# Patient Record
Sex: Female | Born: 1950
Health system: Southern US, Community
[De-identification: ages and names within clinical notes are randomized; demographics above are authoritative.]

## PROBLEM LIST (undated history)

## (undated) DIAGNOSIS — F329 Major depressive disorder, single episode, unspecified: Secondary | ICD-10-CM

## (undated) DIAGNOSIS — F419 Anxiety disorder, unspecified: Secondary | ICD-10-CM

## (undated) DIAGNOSIS — G43909 Migraine, unspecified, not intractable, without status migrainosus: Secondary | ICD-10-CM

## (undated) DIAGNOSIS — I7090 Unspecified atherosclerosis: Secondary | ICD-10-CM

## (undated) DIAGNOSIS — E079 Disorder of thyroid, unspecified: Secondary | ICD-10-CM

## (undated) DIAGNOSIS — M47812 Spondylosis without myelopathy or radiculopathy, cervical region: Secondary | ICD-10-CM

## (undated) DIAGNOSIS — S62609A Fracture of unspecified phalanx of unspecified finger, initial encounter for closed fracture: Secondary | ICD-10-CM

## (undated) DIAGNOSIS — I82409 Acute embolism and thrombosis of unspecified deep veins of unspecified lower extremity: Secondary | ICD-10-CM

## (undated) DIAGNOSIS — M1990 Unspecified osteoarthritis, unspecified site: Secondary | ICD-10-CM

## (undated) DIAGNOSIS — R569 Unspecified convulsions: Secondary | ICD-10-CM

## (undated) DIAGNOSIS — G40909 Epilepsy, unspecified, not intractable, without status epilepticus: Secondary | ICD-10-CM

## (undated) DIAGNOSIS — Z972 Presence of dental prosthetic device (complete) (partial): Secondary | ICD-10-CM

## (undated) HISTORY — DX: Migraine, unspecified, not intractable, without status migrainosus: G43.909

## (undated) HISTORY — PX: TONSILLECTOMY: SUR1361

## (undated) HISTORY — DX: Spondylosis without myelopathy or radiculopathy, cervical region: M47.812

## (undated) HISTORY — DX: Unspecified atherosclerosis: I70.90

## (undated) HISTORY — DX: Epilepsy, unspecified, not intractable, without status epilepticus: G40.909

## (undated) HISTORY — PX: BREAST EXCISIONAL BIOPSY: SUR124

## (undated) HISTORY — PX: TUBAL LIGATION: SHX77

## (undated) HISTORY — PX: NASAL SINUS SURGERY: SHX719

## (undated) HISTORY — DX: Unspecified convulsions: R56.9

---
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
## 1988-04-13 HISTORY — PX: BREAST LUMPECTOMY: SHX2

## 2006-04-13 DIAGNOSIS — I82409 Acute embolism and thrombosis of unspecified deep veins of unspecified lower extremity: Secondary | ICD-10-CM

## 2006-04-13 HISTORY — DX: Acute embolism and thrombosis of unspecified deep veins of unspecified lower extremity: I82.409

## 2011-12-21 ENCOUNTER — Other Ambulatory Visit: Payer: Self-pay | Admitting: Internal Medicine

## 2011-12-21 DIAGNOSIS — Z1231 Encounter for screening mammogram for malignant neoplasm of breast: Secondary | ICD-10-CM

## 2011-12-22 ENCOUNTER — Ambulatory Visit
Admission: RE | Admit: 2011-12-22 | Discharge: 2011-12-22 | Disposition: A | Discharge status: Home / Self Care | Payer: Medicaid Other | Source: Ambulatory Visit | Attending: Internal Medicine | Admitting: Internal Medicine

## 2011-12-22 DIAGNOSIS — Z1231 Encounter for screening mammogram for malignant neoplasm of breast: Secondary | ICD-10-CM

## 2012-06-27 ENCOUNTER — Other Ambulatory Visit: Payer: Self-pay | Admitting: Internal Medicine

## 2012-07-12 ENCOUNTER — Ambulatory Visit
Admission: RE | Admit: 2012-07-12 | Discharge: 2012-07-12 | Disposition: A | Discharge status: Home / Self Care | Payer: Medicaid Other | Source: Ambulatory Visit | Attending: Internal Medicine | Admitting: Internal Medicine

## 2012-07-12 DIAGNOSIS — Z780 Asymptomatic menopausal state: Secondary | ICD-10-CM

## 2012-08-15 ENCOUNTER — Telehealth: Payer: Self-pay | Admitting: Diagnostic Neuroimaging

## 2012-08-15 NOTE — Telephone Encounter (Signed)
I called and spoke with the patient concerning her message. I informed the patient that Dr. Marjory Lies wants her to have a EEG monitoring at [facility] and not a sleep study. I informed the patient that Dr. Richrd Humbles assistant will take care of the referral.

## 2012-08-26 ENCOUNTER — Telehealth: Payer: Self-pay

## 2012-08-26 DIAGNOSIS — R569 Unspecified convulsions: Secondary | ICD-10-CM

## 2012-08-26 NOTE — Telephone Encounter (Signed)
Spoke to pt, says she has not been contacted for EEG scheduling @ [facility]. [facility] would resend order. Pt agreed.

## 2012-09-15 ENCOUNTER — Encounter: Payer: Self-pay | Admitting: Obstetrics & Gynecology

## 2012-10-20 ENCOUNTER — Telehealth: Payer: Self-pay | Admitting: Diagnostic Neuroimaging

## 2012-10-24 NOTE — Telephone Encounter (Signed)
Called patient lvm asking her to return my phone call if she has any more problems with medication.

## 2012-10-24 NOTE — Telephone Encounter (Signed)
Called patients primary and they relayed to me the patient vimpat was sent on 10/21/2012. I called the patient and asked her to call her pharmacy to pick upher refill and to call her primary if she has anymore questions regarding this matter. This was left on her voice mail patient gave the ok to do so.

## 2012-12-23 ENCOUNTER — Telehealth: Payer: Self-pay

## 2012-12-23 NOTE — Telephone Encounter (Signed)
Patient called, left message saying she would like Dr Marjory Lies to start filling Requip 1mg  BID.  We have not prescribed this in the past.  Okay to fill?  Please advise.  Thank you.

## 2012-12-26 ENCOUNTER — Other Ambulatory Visit: Payer: Self-pay | Admitting: Diagnostic Neuroimaging

## 2012-12-28 ENCOUNTER — Ambulatory Visit (INDEPENDENT_AMBULATORY_CARE_PROVIDER_SITE_OTHER): Payer: Medicaid Other | Admitting: Diagnostic Neuroimaging

## 2012-12-28 ENCOUNTER — Encounter: Payer: Self-pay | Admitting: Diagnostic Neuroimaging

## 2012-12-28 VITALS — BP 135/81 | HR 59 | Temp 98.3°F | Ht 62.5 in | Wt 182.3 lb

## 2012-12-28 DIAGNOSIS — G2581 Restless legs syndrome: Secondary | ICD-10-CM

## 2012-12-28 DIAGNOSIS — R569 Unspecified convulsions: Secondary | ICD-10-CM

## 2012-12-28 MED ORDER — ROPINIROLE HCL 1 MG PO TABS: 1.0000 mg | ORAL_TABLET | Freq: Two times a day (BID) | ORAL | Status: DC

## 2012-12-28 NOTE — Patient Instructions (Signed)
Continue current medications.  I will refer you to video EEG monitoring.

## 2012-12-28 NOTE — Progress Notes (Signed)
[facility]  PATIENT: Teresa Nguyen DOB: 1950/11/21  REFERRING CLINICIAN:  HISTORY FROM: PATIENT REASON FOR VISIT: FOLLOW  UP   HISTORICAL  CHIEF COMPLAINT:  Chief Complaint  Patient presents with  . Follow-up    F/U #6    HISTORY OF PRESENT ILLNESS:   UPDATE 12/28/12: Outside neurologist records received and reviewed. 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 Since last visit, no more seizures or spells. Her RLS is getting worse, b/c she is running out of ropinirole and was reducing the dose. Still with high stress. Not interested in seeing psychiatry.  UPDATE 06/10/12: Since last visit, sz stopped, until Dec 2013. She was preparing for colonoscopy, and anticipated having diarrhea from bowel prep. Before she took the bowel prep meds, she started with diarrhea. Soon after she had a seizure (no LOC, eye up, head turned, mouth twitching; no generalized convulsions. she could hear and move, but not talk). She took a valium, and her spell stopped.   I still haven't received records from prior neurologist. Patient tells me that she was dx'd with "partial non-epileptic seizures". She was told to take valium prn to stop her seizures at home, in order to avoid freq ER trips.  PRIOR HPI (12/04/11): 62 year old right-handed female here for evaluation of seizure disorder.  At age 62 years old, patient fell down a flight of stairs. Soon after she began to have "petit mall seizures". Patient states she was having inability to speak, convulsions and tongue biting. She started on Dilantin and phenobarbital early in life. Over the years she tried "many different seizure medications". She was living in [place] for most of her life and followed by neurology there. She had extensive testing with EEG, video EEG, sleep study, MRI, without any significant abnormalities. Unclear if any of these seizures were captured during EEG recordings. It sounds like  no epileptiform discharges were ever noted. Unfortunately I do not have any of her prior records to review myself for this visit.  Patient has been on Vimpat and topiramate over the past year, and now has one episode every 3 weeks of "seizure". Current episodes consist of gagging sensation, inability to speak, without loss of consciousness tongue biting or convulsions.  REVIEW OF SYSTEMS: Full 14 system review of systems performed and notable only for ringing in ears trouble swallowing joint pain feeling cold increased thirst memory loss speech difficulty difficulty swallowing seizures restless legs.  ALLERGIES: Allergies  Allergen Reactions  . Sulfa Antibiotics     HOME MEDICATIONS: Prior to Admission medications   Medication Sig Start Date End Date Taking? Authorizing Provider  lacosamide (VIMPAT) 200 MG TABS tablet Take by mouth 2 (two) times daily.   Yes Historical Provider, MD  rOPINIRole (REQUIP) 1 MG tablet Take 1 tablet (1 mg total) by mouth 2 (two) times daily. 12/28/12  Yes Suanne Marker, MD  topiramate (TOPAMAX) 100 MG tablet Take 100 mg by mouth 2 (two) times daily.   Yes Historical Provider, MD   Outpatient Prescriptions Prior to Visit  Medication Sig Dispense Refill  . lacosamide (VIMPAT) 200 MG TABS tablet Take by mouth 2 (two) times daily.      Marland Kitchen topiramate (TOPAMAX) 100 MG tablet Take 100 mg by mouth 2 (two) times daily.      Marland Kitchen rOPINIRole (REQUIP) 1 MG tablet Take 1 mg by mouth 2 (two) times daily.       Marland Kitchen levothyroxine (SYNTHROID, LEVOTHROID) 25 MCG tablet Take  25 mcg by mouth daily before breakfast.       No facility-administered medications prior to visit.    PAST MEDICAL HISTORY: Past Medical History  Diagnosis Date  . 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 Seizure disorder     since age 35-3    PAST SURGICAL HISTORY: Past Surgical History  Procedure Laterality Date  . Breast lumpectomy  1990    FAMILY HISTORY: Family History  Problem Relation Age of  Onset  . Stroke Father   . Heart attack Father   . Throat cancer Sister   . Seizures Sister   . Migraines Sister   . Diabetes Maternal Grandmother   . Diabetes Maternal Grandfather     SOCIAL HISTORY:  History   Social History  . Marital Status: Divorced    Spouse Name: N/A    Number of Children: 2  . Years of Education: 12th   Occupational History  . Not on file.   Social History Main Topics  . Smoking status: Former Smoker    Quit date: 04/16/2000  . Smokeless tobacco: Not on file  . Alcohol Use: No  . Drug Use: No  . Sexual Activity: Not on file   Other Topics Concern  . Not on file   Social History Narrative   Patient lives at home alone.   Caffeine Use: 1 cup of coffee daily     PHYSICAL EXAM  Filed Vitals:   12/28/12 1404  BP: 135/81  Pulse: 59  Temp: 98.3 F (36.8 C)  TempSrc: Oral  Height: 5' 2.5" (1.588 m)  Weight: 182 lb 4.8 oz (82.691 kg)    Not recorded    Body mass index is 32.79 kg/(m^2).  GENERAL EXAM: Patient is in no distress  CARDIOVASCULAR: Regular rate and rhythm, no murmurs, no carotid bruits  NEUROLOGIC: MENTAL STATUS: awake, alert, language fluent, comprehension intact, naming intact CRANIAL NERVE: no papilledema on fundoscopic exam, pupils equal and reactive to light, visual fields full to confrontation, extraocular muscles intact, no nystagmus, facial sensation and strength symmetric, uvula midline, shoulder shrug symmetric, tongue midline; MILD DYSARTHRIA. MOTOR: normal bulk and tone, full strength in the BUE, BLE SENSORY: normal and symmetric to light touch, pinprick, temperature, vibration COORDINATION: finger-nose-finger, fine finger movements normal REFLEXES: deep tendon reflexes present and symmetric GAIT/STATION: narrow based gait; able to walk on toes, heels and tandem; romberg is negative   DIAGNOSTIC DATA (LABS, IMAGING, TESTING) - I reviewed patient records, labs, notes, testing and imaging myself where  available.  No results found for this basename: WBC, HGB, HCT, MCV, PLT   No results found for this basename: na, k, cl, co2, glucose, bun, creatinine, calcium, prot, albumin, ast, alt, alkphos, bilitot, gfrnonaa, gfraa   No results found for this basename: CHOL, HDL, LDLCALC, LDLDIRECT, TRIG, CHOLHDL   No results found for this basename: HGBA1C   No results found for this basename: VITAMINB12   No results found for this basename: TSH    ASSESSMENT AND PLAN  62 y.o. female with history of seizure disorder since age 33-47 years old, now with possible non-epileptic spells, but stable since Dec 2013.   PLAN: 1. Continue Vimpat for seizure control 2. Continue topiramate for migraine control 3. Continue ropinirole for RLS 4. refer for video EEG monitoring to confirm dx (seizure vs non-epileptic spells)   Orders Placed This Encounter  Procedures  . Ambulatory referral to Neurology    Meds ordered this encounter  Medications  .  rOPINIRole (REQUIP) 1 MG tablet    Sig: Take 1 tablet (1 mg total) by mouth 2 (two) times daily.    Dispense:  60 tablet    Refill:  12    Return in about 1 year (around 12/28/2013) for with Heide Guile or Penumalli.    Suanne Marker, MD 12/28/2012, 2:45 PM Certified in Neurology, Neurophysiology and Neuroimaging  [facility] [address] [place], [place] [address] [phone]

## 2013-01-04 ENCOUNTER — Telehealth: Payer: Self-pay | Admitting: Diagnostic Neuroimaging

## 2013-01-06 NOTE — Telephone Encounter (Signed)
Patient requesting referral for video EEG be sent to [facility] rather than [facility] because [facility] would be more convenient for her. Advised will fwd to referral coordinator. Gave to  [employer]

## 2013-01-13 ENCOUNTER — Other Ambulatory Visit: Payer: Self-pay

## 2013-01-13 DIAGNOSIS — Z1231 Encounter for screening mammogram for malignant neoplasm of breast: Secondary | ICD-10-CM

## 2013-01-20 ENCOUNTER — Other Ambulatory Visit: Payer: Self-pay

## 2013-01-20 DIAGNOSIS — Z1231 Encounter for screening mammogram for malignant neoplasm of breast: Secondary | ICD-10-CM

## 2013-01-31 ENCOUNTER — Ambulatory Visit: Payer: Self-pay

## 2013-02-01 ENCOUNTER — Ambulatory Visit
Admission: RE | Admit: 2013-02-01 | Discharge: 2013-02-01 | Disposition: A | Discharge status: Home / Self Care | Payer: Medicaid Other | Source: Ambulatory Visit

## 2013-02-01 DIAGNOSIS — Z1231 Encounter for screening mammogram for malignant neoplasm of breast: Secondary | ICD-10-CM

## 2013-02-16 ENCOUNTER — Telehealth: Payer: Self-pay | Admitting: Diagnostic Neuroimaging

## 2013-02-21 ENCOUNTER — Encounter: Payer: Self-pay | Admitting: Advanced Practice Midwife

## 2013-02-21 ENCOUNTER — Ambulatory Visit (INDEPENDENT_AMBULATORY_CARE_PROVIDER_SITE_OTHER): Payer: Medicaid Other | Admitting: Advanced Practice Midwife

## 2013-02-21 VITALS — BP 166/92 | HR 63 | Temp 99.0°F | Ht 63.0 in | Wt 180.0 lb

## 2013-02-21 DIAGNOSIS — I10 Essential (primary) hypertension: Secondary | ICD-10-CM | POA: Insufficient documentation

## 2013-02-21 DIAGNOSIS — Z0000 Encounter for general adult medical examination without abnormal findings: Secondary | ICD-10-CM

## 2013-02-21 DIAGNOSIS — G43909 Migraine, unspecified, not intractable, without status migrainosus: Secondary | ICD-10-CM | POA: Insufficient documentation

## 2013-02-21 DIAGNOSIS — F439 Reaction to severe stress, unspecified: Secondary | ICD-10-CM

## 2013-02-21 DIAGNOSIS — R1031 Right lower quadrant pain: Secondary | ICD-10-CM | POA: Insufficient documentation

## 2013-02-21 NOTE — Progress Notes (Signed)
Patient ID: Teresa Nguyen, female   DOB: 04-26-1950, 62 y.o.   MRN: [id number]  Chief Complaint  Patient presents with  . Gynecologic Exam    HPI Teresa Nguyen is a 62 y.o. female.  Patient here for GYN exam. Reports intermittent monthly RLQ pain. Denies other GYN concerns.  Reports new onset of migraines, she is being managed by a Neurologist, she has a sleep study pending. Her migraines due cause partial seizures. She reports having specific symptoms prior to a seizure, reports having the seizure disorder since 62yo.     Gynecologic Exam The patient's primary symptoms include pelvic pain. Associated symptoms include constipation and headaches. Pertinent negatives include no diarrhea, dysuria or fever. There is no history of an abdominal surgery or a gynecological surgery.  Pelvic Pain The patient's primary symptoms include pelvic pain. This is a chronic problem. The current episode started more than 1 year ago. The problem occurs rarely. The problem has been unchanged. The pain is mild. The problem affects the right side. She is not pregnant. Associated symptoms include constipation and headaches. Pertinent negatives include no diarrhea, dysuria or fever. Nothing aggravates the symptoms. She has tried nothing for the symptoms. She is not sexually active. She uses tubal ligation for contraception. She is postmenopausal. There is no history of an abdominal surgery or a gynecological surgery.    Past Medical History  Diagnosis Date  . 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 Seizure disorder     since age 49-3  . Migraine     Past Surgical History  Procedure Laterality Date  . Breast lumpectomy  1990  . Tubal ligation      Family History  Problem Relation Age of Onset  . Stroke Father   . Heart attack Father   . Throat cancer Sister   . Seizures Sister   . Migraines Sister   . Diabetes Maternal Grandmother   . Diabetes Maternal Grandfather     Social History History  Substance  Use Topics  . Smoking status: Former Smoker    Quit date: 04/16/1998  . Smokeless tobacco: Not on file  . Alcohol Use: No    Allergies  Allergen Reactions  . Sulfa Antibiotics     Current Outpatient Prescriptions  Medication Sig Dispense Refill  . aspirin-acetaminophen-caffeine (EXCEDRIN MIGRAINE) 250-250-65 MG per tablet Take 1 tablet by mouth every 6 (six) hours as needed for headache.      . diazepam (VALIUM) 10 MG tablet Take 10 mg by mouth every 6 (six) hours as needed for anxiety.      Marland Kitchen lacosamide (VIMPAT) 200 MG TABS tablet Take by mouth 2 (two) times daily.      Marland Kitchen rOPINIRole (REQUIP) 1 MG tablet Take 1 tablet (1 mg total) by mouth 2 (two) times daily.  60 tablet  12  . topiramate (TOPAMAX) 100 MG tablet Take 100 mg by mouth 2 (two) times daily.      . vitamin E 200 UNIT capsule Take 200 Units by mouth daily.       No current facility-administered medications for this visit.    Review of Systems Review of Systems  Constitutional: Positive for fatigue. Negative for fever, activity change and appetite change.  Eyes: Positive for visual disturbance.  Respiratory: Negative.   Cardiovascular: Negative.   Gastrointestinal: Positive for constipation. Negative for diarrhea.  Endocrine: Negative.   Genitourinary: Positive for pelvic pain. Negative for dysuria.  Musculoskeletal: Negative.   Skin: Negative.  Allergic/Immunologic: Negative.   Neurological: Positive for seizures and headaches.       New onset of migraines  Hematological: Negative.     Blood pressure 166/92, pulse 63, temperature 99 F (37.2 C), height 5\' 3"  (1.6 m), weight 180 lb (81.647 kg).  Physical Exam Physical Exam  Constitutional: She is oriented to person, place, and time. She appears well-developed and well-nourished.  HENT:  Head: Normocephalic.  Eyes: Pupils are equal, round, and reactive to light.  Neck: Normal range of motion.  Cardiovascular: Normal rate, regular rhythm and normal heart  sounds.   Pulmonary/Chest: Effort normal and breath sounds normal.  Abdominal: Soft. Bowel sounds are normal.  Genitourinary: Vagina normal.  Right lower pelvic pain w/ exam, fullness, no mass palpable.   Musculoskeletal: Normal range of motion.  Neurological: She is alert and oriented to person, place, and time.  Skin: Skin is warm and dry.  Psychiatric: She has a normal mood and affect. Her behavior is normal. Judgment and thought content normal.  Rectal Exam: no polyps or masses palpated, no evidence of bleeding  Data Reviewed NA  Assessment    Patient Active Problem List   Diagnosis Date Noted  . Migraine 02/21/2013  . Right lower quadrant pain 02/21/2013  . Hypertension 02/21/2013  . Stress at home 02/21/2013  . Restless leg syndrome 12/28/2012  . Convulsion, non-epileptic 12/28/2012       Plan    Recommended patient not drive at this time due to seizure condition. [place] for right lower quad pain ordered today. Cont to monitor symptoms.   Pap smear today RTC PRN       Charmayne Odell 02/21/2013, 9:48 AM

## 2013-02-22 NOTE — Addendum Note (Signed)
Addended by: Henriette Combs on: 02/22/2013 10:30 AM   Modules accepted: Orders

## 2013-02-23 LAB — PAP IG W/ RFLX HPV ASCU

## 2013-02-24 ENCOUNTER — Other Ambulatory Visit: Payer: Self-pay

## 2013-02-24 MED ORDER — TOPIRAMATE 100 MG PO TABS: 100.0000 mg | ORAL_TABLET | Freq: Two times a day (BID) | ORAL | Status: DC

## 2013-02-24 NOTE — Telephone Encounter (Signed)
I called and let patient know topiramate Rx has been sent.

## 2013-03-28 ENCOUNTER — Other Ambulatory Visit: Payer: Self-pay | Admitting: Diagnostic Neuroimaging

## 2013-03-28 MED ORDER — LACOSAMIDE 200 MG PO TABS: 200.0000 mg | ORAL_TABLET | Freq: Two times a day (BID) | ORAL | Status: DC

## 2013-03-28 NOTE — Telephone Encounter (Signed)
Patient called stating that she had a death in the family and will not be able to do her sleep lab appointment at [facility] until 05/09/13. Patient states she only has enough Vimpat medication to last her until 04/13/13. Patient is requesting a refill until January appointment. Patient wishes to be called if this is to be done, please call the patient.

## 2013-03-30 NOTE — Telephone Encounter (Signed)
Rx faxed to [facility] at [phone]. 

## 2013-05-05 ENCOUNTER — Telehealth: Payer: Self-pay | Admitting: Diagnostic Neuroimaging

## 2013-05-05 NOTE — Telephone Encounter (Signed)
Pt called and stated that she has an appointment on Tuesday, 05/09/13 at [facility].  She wanted to know if we had already sent over the notes as we referred her to that office.  Please contact.

## 2013-05-09 DIAGNOSIS — G40209 Localization-related (focal) (partial) symptomatic epilepsy and epileptic syndromes with complex partial seizures, not intractable, without status epilepticus: Secondary | ICD-10-CM | POA: Insufficient documentation

## 2013-05-11 ENCOUNTER — Other Ambulatory Visit (HOSPITAL_COMMUNITY): Payer: Self-pay | Admitting: Psychiatry

## 2013-05-11 DIAGNOSIS — R569 Unspecified convulsions: Secondary | ICD-10-CM

## 2013-05-24 ENCOUNTER — Ambulatory Visit (HOSPITAL_COMMUNITY)
Admission: RE | Admit: 2013-05-24 | Discharge: 2013-05-24 | Disposition: A | Discharge status: Home / Self Care | Payer: Medicaid Other | Source: Ambulatory Visit | Attending: Psychiatry | Admitting: Psychiatry

## 2013-05-24 DIAGNOSIS — I10 Essential (primary) hypertension: Secondary | ICD-10-CM | POA: Insufficient documentation

## 2013-05-24 DIAGNOSIS — G40909 Epilepsy, unspecified, not intractable, without status epilepticus: Secondary | ICD-10-CM | POA: Insufficient documentation

## 2013-05-24 DIAGNOSIS — R569 Unspecified convulsions: Secondary | ICD-10-CM

## 2013-05-24 LAB — CREATININE, SERUM
CREATININE: 0.76 mg/dL (ref 0.50–1.10)
GFR calc Af Amer: >90 mL/min (ref >90)
GFR calc non Af Amer: 90 mL/min — ABNORMAL LOW (ref >90)

## 2013-05-24 MED ORDER — GADOBENATE DIMEGLUMINE 529 MG/ML IV SOLN
18.0000 mL | Freq: Once | INTRAVENOUS | Status: AC
  Administered 2013-05-24: 18 mL via INTRAVENOUS

## 2013-12-29 ENCOUNTER — Encounter (INDEPENDENT_AMBULATORY_CARE_PROVIDER_SITE_OTHER): Payer: Self-pay

## 2013-12-29 ENCOUNTER — Encounter: Payer: Self-pay | Admitting: Nurse Practitioner

## 2013-12-29 ENCOUNTER — Ambulatory Visit (INDEPENDENT_AMBULATORY_CARE_PROVIDER_SITE_OTHER): Payer: Medicaid Other | Admitting: Nurse Practitioner

## 2013-12-29 VITALS — BP 128/74 | HR 59 | Ht 63.0 in | Wt 178.2 lb

## 2013-12-29 DIAGNOSIS — G40909 Epilepsy, unspecified, not intractable, without status epilepticus: Secondary | ICD-10-CM

## 2013-12-29 DIAGNOSIS — G40209 Localization-related (focal) (partial) symptomatic epilepsy and epileptic syndromes with complex partial seizures, not intractable, without status epilepticus: Secondary | ICD-10-CM | POA: Insufficient documentation

## 2013-12-29 DIAGNOSIS — G43001 Migraine without aura, not intractable, with status migrainosus: Secondary | ICD-10-CM

## 2013-12-29 DIAGNOSIS — G40802 Other epilepsy, not intractable, without status epilepticus: Secondary | ICD-10-CM

## 2013-12-29 DIAGNOSIS — G2581 Restless legs syndrome: Secondary | ICD-10-CM

## 2013-12-29 DIAGNOSIS — G40219 Localization-related (focal) (partial) symptomatic epilepsy and epileptic syndromes with complex partial seizures, intractable, without status epilepticus: Secondary | ICD-10-CM

## 2013-12-29 MED ORDER — LACOSAMIDE 200 MG PO TABS: 200.0000 mg | ORAL_TABLET | Freq: Two times a day (BID) | ORAL | Status: DC

## 2013-12-29 MED ORDER — DIAZEPAM 10 MG PO TABS: 10.0000 mg | ORAL_TABLET | Freq: Four times a day (QID) | ORAL | Status: DC | PRN

## 2013-12-29 MED ORDER — TOPIRAMATE 100 MG PO TABS: 100.0000 mg | ORAL_TABLET | Freq: Two times a day (BID) | ORAL | Status: DC

## 2013-12-29 MED ORDER — ROPINIROLE HCL 1 MG PO TABS: 1.0000 mg | ORAL_TABLET | Freq: Two times a day (BID) | ORAL | Status: DC

## 2013-12-29 NOTE — Progress Notes (Signed)
PATIENT: Teresa Nguyen DOB: Nov 15, 1950  REASON FOR VISIT: routine follow up for frontal lobe epilepsy, RLS HISTORY FROM: patient  HISTORY OF PRESENT ILLNESS: UPDATE 12/29/13 (LL): Since last visit, went to [facility] to have EMU monitoring. EMU June 15-18, 2015: Long-term video EEG monitoring captured multiple events; including electrographic frontal hypermotor seizures. She also exhibited episodes of oral dyskinetic activity which is not seizure (no EEG correlate). It was concluded that her typical arousal events are consistent with frontal lobe epilepsy. Home medications Vimpat 200 mg twice a day,Topirimate 100 mg twice a day were resumed, but they recommended reducing home dose of Valium as she was taking 10-20 mg when necessary which likely to affects her affect and impairs her communication. She has had several close family members who have died in the last year, including her mother and her sister who died suddenly. Patient is very content on her current medication regimen, stating that she has felt better on Vimpat than ever before. She has not had many nocturnal seizures since starting Vimpat, and states that she was afraid to go to sleep prior to starting it. Her past AEDs have been phenobarbital, phenytoin, Lacosamide, lamotrigine, levetiracetam, Topirimate, valproic acid and zonisamide.Known seizure triggers are eating broccoli or eggplant. RLS symptoms are controlled on Ropinirole, although some nights she must take an extra 0.25 of a tablet to be comfortable.  UPDATE 12/28/12: Outside neurologist records received and reviewed. 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 Since last visit, no more seizures or spells. Her RLS is getting worse, b/c she is running out of ropinirole and was reducing the dose. Still with high stress. Not interested in seeing psychiatry.  UPDATE 06/10/12: Since last visit, sz stopped, until Dec 2013. She was preparing for colonoscopy,  and anticipated having diarrhea from bowel prep. Before she took the bowel prep meds, she started with diarrhea. Soon after she had a seizure (no LOC, eye up, head turned, mouth twitching; no generalized convulsions. she could hear and move, but not talk). She took a valium, and her spell stopped.  I still haven't received records from prior neurologist. Patient tells me that she was dx'd with "partial non-epileptic seizures". She was told to take valium prn to stop her seizures at home, in order to avoid freq ER trips.  PRIOR HPI (12/04/11): 63 year old right-handed female here for evaluation of seizure disorder.  At age 42 years old, patient fell down a flight of stairs. Soon after she began to have "petit mall seizures". Patient states she was having inability to speak, convulsions and tongue biting. She started on Dilantin and phenobarbital early in life. Over the years she tried "many different seizure medications". She was living in [place] for most of her life and followed by neurology there. She had extensive testing with EEG, video EEG, sleep study, MRI, without any significant abnormalities. Unclear if any of these seizures were captured during EEG recordings. It sounds like no epileptiform discharges were ever noted. Unfortunately I do not have any of her prior records to review myself for this visit.  Patient has been on Vimpat and topiramate over the past year, and now has one episode every 3 weeks of "seizure". Current episodes consist of gagging sensation, inability to speak, without loss of consciousness tongue biting or convulsions.   REVIEW OF SYSTEMS: Full 14 system review of systems performed and notable only for ringing in ears runny nose, trouble swallowing, drooling, blurred vision, cough, shortness of breath,  and heat intolerance, nausea, restless leg, and joint pain, neck pain, bruise easily, and memory loss, speech difficulty, numbness, seizures.  ALLERGIES: Allergies  Allergen  Reactions  . Other Diarrhea    Nuts cause diarrhea   . Sulfa Antibiotics     HOME MEDICATIONS: Outpatient Prescriptions Prior to Visit  Medication Sig Dispense Refill  . aspirin-acetaminophen-caffeine (EXCEDRIN MIGRAINE) 250-250-65 MG per tablet Take 1 tablet by mouth every 6 (six) hours as needed for headache.      . vitamin E 200 UNIT capsule Take 200 Units by mouth daily.      . diazepam (VALIUM) 10 MG tablet Take 10 mg by mouth every 6 (six) hours as needed for anxiety.      Marland Kitchen lacosamide (VIMPAT) 200 MG TABS tablet Take 1 tablet (200 mg total) by mouth 2 (two) times daily.  60 tablet  5  . rOPINIRole (REQUIP) 1 MG tablet Take 1 tablet (1 mg total) by mouth 2 (two) times daily.  60 tablet  12  . topiramate (TOPAMAX) 100 MG tablet Take 1 tablet (100 mg total) by mouth 2 (two) times daily.  180 tablet  3   No facility-administered medications prior to visit.    PHYSICAL EXAM Filed Vitals:   12/29/13 1034  BP: 128/74  Pulse: 59  Height: 5\' 3"  (1.6 m)  Weight: 178 lb 3.2 oz (80.831 kg)   Body mass index is 31.57 kg/(m^2).  Generalized: Well developed, in no acute distress  Head: normocephalic and atraumatic. Oropharynx benign  Neck: Supple, no carotid bruits  Cardiac: Regular rate rhythm, no murmur  Musculoskeletal: No deformity   Neurological examination  Mentation: Alert oriented to time, place, history taking. Follows all commands speech and language fluent, MILD DYSARTHRIA.  Cranial nerve II-XII: Fundoscopic exam not done. Pupils were equal round reactive to light extraocular movements were full, visual field were full on confrontational test. Facial sensation and strength were normal. hearing was intact to finger rubbing bilaterally. Uvula tongue midline. head turning and shoulder shrug and were normal and symmetric.Tongue protrusion into cheek strength was normal. Motor: The motor testing reveals 5 over 5 strength of all 4 extremities. Good symmetric motor tone is noted  throughout.  Sensory: Sensory testing is intact to soft touch on all 4 extremities. No evidence of extinction is noted.  Coordination: Cerebellar testing reveals good finger-nose-finger and heel-to-shin bilaterally.  Gait and station: Gait is normal. Tandem gait is normal. Romberg is negative. Reflexes: Deep tendon reflexes are symmetric and normal bilaterally.   ASSESSMENT: 63 y.o. female with history of seizure disorder since age 30-4 years old, diagnosed at [facility] in June 2015 with frontal lobe epilepsy through VEEG. She is not interested in surgery intervention at this time. We discussed adding Onfi, she declined at the present time.  PLAN:  Continue Vimpat for seizure control, 200 mg bid Continue topiramate 100 mg bid Continue Valium 10 mg prn for seizure aura. Continue ropinirole for RLS 1 mg bid, may take 1/2 tablet extra at night if needed. Follow up in 1 year, sooner as needed.  Meds ordered this encounter  Medications  . rOPINIRole (REQUIP) 1 MG tablet    Sig: Take 1 tablet (1 mg total) by mouth 2 (two) times daily. May take extra 1/2 tablet in evening if needed.    Dispense:  90 tablet    Refill:  12    Order Specific Question:  Supervising Provider    Answer:  Andrey Spearman R [[id number]]  .  diazepam (VALIUM) 10 MG tablet    Sig: Take 1 tablet (10 mg total) by mouth every 6 (six) hours as needed for anxiety.    Dispense:  45 tablet    Refill:  5    Order Specific Question:  Supervising Provider    Answer:  Andrey Spearman R [[id number]]  . topiramate (TOPAMAX) 100 MG tablet    Sig: Take 1 tablet (100 mg total) by mouth 2 (two) times daily.    Dispense:  180 tablet    Refill:  3    Order Specific Question:  Supervising Provider    Answer:  Andrey Spearman R [[id number]]  . lacosamide (VIMPAT) 200 MG TABS tablet    Sig: Take 1 tablet (200 mg total) by mouth 2 (two) times daily.    Dispense:  60 tablet    Refill:  5    Patient meets PA criteria Pharmacy Fax  [phone]    Order Specific Question:  Supervising Provider    Answer:  Penni Bombard [[id number]]   Return in about 1 year (around 12/30/2014) for seizures.  Teresa Rummage Zoiee Wimmer, MSN, FNP-BC, A/GNP-C 01/01/2014, 7:10 PM [facility] [address], [place], [place] [address] [phone]  Note: This document was prepared with digital dictation and possible smart phrase technology. Any transcriptional errors that result from this process are unintentional.

## 2013-12-29 NOTE — Patient Instructions (Signed)
PLAN:  1. Continue Vimpat for seizure control, 200 mg bid 2. Continue topiramate 100 mg  3. Continue ropinirole for RLS 1 mg bid, may take 1/2 tablet extra at night if needed. 4. Continue Valium as needed for seizure aura. 5. Follow up in 1 year, sooner as needed.  If you are interested in trying another new medication called Onfi for seizure control, please call the office.  Managing Seizure Triggers: Tips for Lifestyle Modification Adapted from the [place], [facility], [place], [place] and the journal "Clinical Nursing Practice in Epilepsy", Spring 1994.  Developing plans to modify your lifestyle is an important part of seizure preparedness. It's a way that you, as a person with seizures or a parent of a child with seizures, can take charge and play an active role in your epilepsy care. The following tips are examples of what people can do to manage triggers. Some of these tips may require a change in behavior, others may be ways to adjust your environment or schedule so not everything happens at once. Before choosing tips to try, make sure you've assessed your situation and talked to your doctor and other health care professionals for their suggestions too. Please note that research on the effectiveness of many of these techniques is limited. Many of these tips are common sense suggestions or are from health care professionals and people with epilepsy as to what they have seen and tried.  Noises: People who think they are affected by noises should be sure to talk to their doctor about whether they have a form of 'reflex epilepsy' or if general noise or distraction may be a trigger in another way. People with true reflex epilepsy may respond to specific seizure medicines and should talk to their doctor. Try using earplugs or earphones, especially in noisy or crowded places. Try listening to relaxing music or sounds, or try distracting  yourself by singing or focusing on another activity.  Bright, flashing or fluorescent lights: Use polarized or tinted glasses. Use natural lighting when indoors. Focus on distant objects when riding in a car to avoid flickering lights or patterns. Avoid discos, strobe lights or flashing bulbs on holiday decorations. Use computer monitor with minimal contrast glare or use a screen filter. Consult with your doctor about other specific recommendations for computer use.  Sleep: Try to regulate sleeping habits so you have a consistent schedule and get enough sleep. Keep a log or diary of your sleep patterns, seizures and general well-being. Ask a partner or companion to record his or her observations too. Consider the following ideas to improve sleep.  . Discuss your medicine schedule with your doctor or nurse. Changing times or doses at night may help sleep. . Limit caffeine and try to avoid it after noon time or mid?afternoon at the latest. . Avoid alcohol and nicotine prior to sleep. . Limit working or studying late at night. Stop work at least one hour before bedtime to allow time to relax. . Exercise in the early evening if possible. . Take warm showers or have someone give you a back rub before bedtime to decrease muscle tension. . Try relaxation exercises before bedtime. . Limit naps and don't nap in the early evening. . If anxious or worried, talk to someone or write down your feelings before going to sleep. Put this away and deal with these worries or concerns in the morning! . If you can't fall asleep within 15 minutes get up and do something  else for 15 minutes. Then go back to bed and try again. Don't toss and turn in bed all night.  Exercise: Regular exercise is good for everyone. Pace your exercise to avoid getting too tired or hyperventilation. Avoid exercising in the middle of the day during hot weather. Ask your doctor about any specific exercises you may need to  avoid.  Hyperventilation: Try relaxation or slow breathing exercises when anxious or if you begin to hyperventilate. Pace your activity and avoid sports that may trigger hyperventilation.  Diet: Regulate meal times and patterns around sleep, activity, and medication schedules. Usually taking medicines after food or around meals makes it easier to remember them and may lessen any stomach distress from side effects of medicines. Have a well-balanced diet and eat at consistent times to avoid long periods without food. If your appetite is poor, try small frequent meals instead of skipping meals. Avoid foods and drinks that may aggravate seizures. Not everyone is sensitive to foods, but if you are, talk to your doctor about how to modify your diet. If you are following a diet specifically for your epilepsy, be sure to follow the advice of your doctor and nutritionist.  Alcohol/Drugs: Avoid recreational drugs and talk to your doctor about use of alcohol. Avoid alcohol completely if you're going through high-risk times or have recently had surgery. If you choose to drink alcohol, use 'moderation', drink slowly, and have only one or two glasses at a time. Consider carefully what you drink, avoiding 'hard liquor' or mixed drinks that may have high alcohol content. If alcohol and drugs are a problem for you, talk to your doctor and get professional help.  Hormonal changes: Both men and women may notice a cyclical pattern to their seizures. Record seizures on a calendar and track them in relation to any changes in hormones. Women who are having menstrual cycles should track their cycle days. Women who have stopped having their menses should track other symptoms or changes, while women who are pregnant should track their pregnancy too. The use of hormonal medicines, such as contraceptives or birth control pills as well as hormonal replacement therapy, may affect seizures in some women, so record the dates and  doses of these medicines.  NOTE: some seizure medicines may interfere with the effectiveness of hormonal contraceptives making unexpected or unplanned pregnancy more likely. Be sure to talk to your doctor about all contraceptive use.When seizures cluster around menses or hormone changes, women should try to modify their lifestyle so other triggers don't occur during this high-risk time. Some women may use 'as needed' medicines to help treat seizures associated with menses. Note the use of these on your calendars and seizure preparedness plan.  Illness, fever, trauma: Notify your doctor if you become ill, have a fever, injure yourself seriously, or need other medicines such as antibiotics, painkillers, or cold medicines. Some people may notice that certain medicines can trigger seizures or interfere with seizure medicines. Fevers, other illnesses and injuries may also make you more susceptible and you'll need to monitor your seizures carefully. Try to limit other triggers during these times and talk to your doctor about what medicines you can use.  Stress, anxiety, depression: Emotional stress is a common trigger for some people, and stress can be a cause and symptom of mood problems such as anxiety and depression. Track your stress level and mood in relation to your seizures on your diary. During stressful times, consider ways to modify your lifestyle and manage stress better. Marland Kitchen  Try counseling to help cope with seizures or other problems. . Consider support groups for epilepsy, or groups for stress management, therapy, and other support. . Write down feelings in a diary on a regular basis. It helps you get feelings out, rather than hold them in, and can help you see the issues more clearly. . Use 'time-out' periods. Just like kids may need a time-out when they are overwhelmed or acting out, so too do adults. Giving yourself a time-out allows you to take a step back from the stressor or  situation and think about how best to address it. . Learn relaxation exercises, deep breathing, yoga, or other strategies that help with stress and general well-being. . Tell your doctor and nurse how you feel. The effects of stress can be harmful to your seizures, and your life. When mood changes last longer than expected, you may need help from a mental health professional too. If you feel emotionally unsafe, call your doctor or go to an emergency room to be evaluated.

## 2014-01-01 DIAGNOSIS — G40802 Other epilepsy, not intractable, without status epilepticus: Secondary | ICD-10-CM | POA: Insufficient documentation

## 2014-01-04 NOTE — Progress Notes (Signed)
I reviewed note and agree with plan.   Barnet Benavides R. Chevy Virgo, MD  Certified in Neurology, Neurophysiology and Neuroimaging  [facility] [address], [address] [place], [place] [address] [phone]   

## 2014-06-03 ENCOUNTER — Emergency Department (HOSPITAL_COMMUNITY): Payer: Medicaid Other

## 2014-06-03 ENCOUNTER — Encounter (HOSPITAL_COMMUNITY): Payer: Self-pay | Admitting: *Deleted

## 2014-06-03 ENCOUNTER — Emergency Department (HOSPITAL_COMMUNITY)
Admission: EM | Admit: 2014-06-03 | Discharge: 2014-06-03 | Disposition: A | Discharge status: Home / Self Care | Payer: Medicaid Other | Attending: Emergency Medicine | Admitting: Emergency Medicine

## 2014-06-03 DIAGNOSIS — G43909 Migraine, unspecified, not intractable, without status migrainosus: Secondary | ICD-10-CM | POA: Diagnosis not present

## 2014-06-03 DIAGNOSIS — M1990 Unspecified osteoarthritis, unspecified site: Secondary | ICD-10-CM | POA: Diagnosis not present

## 2014-06-03 DIAGNOSIS — Z791 Long term (current) use of non-steroidal anti-inflammatories (NSAID): Secondary | ICD-10-CM | POA: Diagnosis not present

## 2014-06-03 DIAGNOSIS — E079 Disorder of thyroid, unspecified: Secondary | ICD-10-CM | POA: Insufficient documentation

## 2014-06-03 DIAGNOSIS — Z79899 Other long term (current) drug therapy: Secondary | ICD-10-CM | POA: Diagnosis not present

## 2014-06-03 DIAGNOSIS — R55 Syncope and collapse: Secondary | ICD-10-CM | POA: Insufficient documentation

## 2014-06-03 DIAGNOSIS — Z87891 Personal history of nicotine dependence: Secondary | ICD-10-CM | POA: Diagnosis not present

## 2014-06-03 HISTORY — DX: Unspecified osteoarthritis, unspecified site: M19.90

## 2014-06-03 HISTORY — DX: Disorder of thyroid, unspecified: E07.9

## 2014-06-03 LAB — BASIC METABOLIC PANEL
Anion gap: 3 — ABNORMAL LOW (ref 5–15)
BUN: 29 mg/dL — AB (ref 6–23)
CO2: 25 mmol/L (ref 19–32)
Calcium: 9.3 mg/dL (ref 8.4–10.5)
Chloride: 114 mmol/L — ABNORMAL HIGH (ref 96–112)
Creatinine, Ser: 0.99 mg/dL (ref 0.50–1.10)
GFR calc non Af Amer: 60 mL/min — ABNORMAL LOW (ref >90)
GFR, EST AFRICAN AMERICAN: 70 mL/min — AB (ref >90)
Glucose, Bld: 110 mg/dL — ABNORMAL HIGH (ref 70–99)
Potassium: 3.8 mmol/L (ref 3.5–5.1)
SODIUM: 142 mmol/L (ref 135–145)

## 2014-06-03 LAB — CBC
HCT: 41.5 % (ref 36.0–46.0)
HEMOGLOBIN: 13.9 g/dL (ref 12.0–15.0)
MCH: 30.3 pg (ref 26.0–34.0)
MCHC: 33.5 g/dL (ref 30.0–36.0)
MCV: 90.4 fL (ref 78.0–100.0)
PLATELETS: 175 10*3/uL (ref 150–400)
RBC: 4.59 MIL/uL (ref 3.87–5.11)
RDW: 12.4 % (ref 11.5–15.5)
WBC: 5.4 10*3/uL (ref 4.0–10.5)

## 2014-06-03 LAB — I-STAT TROPONIN, ED: Troponin i, poc: 0.01 ng/mL (ref 0.00–0.08)

## 2014-06-03 NOTE — ED Notes (Signed)
Pt reports getting up this am and fell in bathroom. Pt unsure if she passed out or possibly had a seizure, has hx of seizures. Pt reports began not feeling well last night, no appetite and didn't sleep well.

## 2014-06-03 NOTE — ED Notes (Signed)
Pt stated that she took diazepam PO approximately 1140am today.

## 2014-06-03 NOTE — ED Notes (Signed)
EDP at bedside reviewing pt results.

## 2014-06-03 NOTE — ED Notes (Signed)
Attempted IV access.  

## 2014-06-03 NOTE — Discharge Instructions (Signed)
Syncope °Syncope is a medical term for fainting or passing out. This means you lose consciousness and drop to the ground. People are generally unconscious for less than 5 minutes. You may have some muscle twitches for up to 15 seconds before waking up and returning to normal. Syncope occurs more often in older adults, but it can happen to anyone. While most causes of syncope are not dangerous, syncope can be a sign of a serious medical problem. It is important to seek medical care.  °CAUSES  °Syncope is caused by a sudden drop in blood flow to the brain. The specific cause is often not determined. Factors that can bring on syncope include: °· Taking medicines that lower blood pressure. °· Sudden changes in posture, such as standing up quickly. °· Taking more medicine than prescribed. °· Standing in one place for too long. °· Seizure disorders. °· Dehydration and excessive exposure to heat. °· Low blood sugar (hypoglycemia). °· Straining to have a bowel movement. °· Heart disease, irregular heartbeat, or other circulatory problems. °· Fear, emotional distress, seeing blood, or severe pain. °SYMPTOMS  °Right before fainting, you may: °· Feel dizzy or light-headed. °· Feel nauseous. °· See all white or all black in your field of vision. °· Have cold, clammy skin. °DIAGNOSIS  °Your health care provider will ask about your symptoms, perform a physical exam, and perform an electrocardiogram (ECG) to record the electrical activity of your heart. Your health care provider may also perform other heart or blood tests to determine the cause of your syncope which may include: °· Transthoracic echocardiogram (TTE). During echocardiography, sound waves are used to evaluate how blood flows through your heart. °· Transesophageal echocardiogram (TEE). °· Cardiac monitoring. This allows your health care provider to monitor your heart rate and rhythm in real time. °· Holter monitor. This is a portable device that records your  heartbeat and can help diagnose heart arrhythmias. It allows your health care provider to track your heart activity for several days, if needed. °· Stress tests by exercise or by giving medicine that makes the heart beat faster. °TREATMENT  °In most cases, no treatment is needed. Depending on the cause of your syncope, your health care provider may recommend changing or stopping some of your medicines. °HOME CARE INSTRUCTIONS °· Have someone stay with you until you feel stable. °· Do not drive, use machinery, or play sports until your health care provider says it is okay. °· Keep all follow-up appointments as directed by your health care provider. °· Lie down right away if you start feeling like you might faint. Breathe deeply and steadily. Wait until all the symptoms have passed. °· Drink enough fluids to keep your urine clear or pale yellow. °· If you are taking blood pressure or heart medicine, get up slowly and take several minutes to sit and then stand. This can reduce dizziness. °SEEK IMMEDIATE MEDICAL CARE IF:  °· You have a severe headache. °· You have unusual pain in the chest, abdomen, or back. °· You are bleeding from your mouth or rectum, or you have black or tarry stool. °· You have an irregular or very fast heartbeat. °· You have pain with breathing. °· You have repeated fainting or seizure-like jerking during an episode. °· You faint when sitting or lying down. °· You have confusion. °· You have trouble walking. °· You have severe weakness. °· You have vision problems. °If you fainted, call your local emergency services (911 in [place] Do not drive   yourself to the hospital.  °MAKE SURE YOU: °· Understand these instructions. °· Will watch your condition. °· Will get help right away if you are not doing well or get worse. °Document Released: 03/30/2005 Document Revised: 04/04/2013 Document Reviewed: 05/29/2011 °ExitCare® Patient Information ©2015 [employer]. This information is not intended to replace  advice given to you by your health care provider. Make sure you discuss any questions you have with your health care provider. ° °

## 2014-06-03 NOTE — ED Provider Notes (Signed)
CSN: [id number]     Arrival date & time 06/03/14  1401 History   First MD Initiated Contact with Patient 06/03/14 1454     Chief Complaint  Patient presents with  . Loss of Consciousness     (Consider location/radiation/quality/duration/timing/severity/associated sxs/prior Treatment) Patient is a 64 y.o. female presenting with syncope.  Loss of Consciousness Episode history:  Single Most recent episode:  Today Duration: very brief. Timing:  Constant Progression:  Unchanged Chronicity:  Recurrent Context: standing up   Context comment:  Ho seizures, atypical Relieved by:  Nothing Worsened by:  Nothing tried Ineffective treatments:  None tried Associated symptoms: no anxiety, no chest pain, no confusion, no difficulty breathing, no fever, no nausea, no shortness of breath and no vomiting     Past Medical History  Diagnosis Date  . 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 Seizure disorder     since age 44-3  . Migraine   . Thyroid disease   . Arthritis    Past Surgical History  Procedure Laterality Date  . Breast lumpectomy  1990  . Tubal ligation     Family History  Problem Relation Age of Onset  . Stroke Father   . Heart attack Father   . Throat cancer Sister   . Seizures Sister   . Migraines Sister   . Diabetes Maternal Grandmother   . Diabetes Maternal Grandfather    History  Substance Use Topics  . Smoking status: Former Smoker    Quit date: 04/16/1998  . Smokeless tobacco: Not on file  . Alcohol Use: No   OB History    No data available     Review of Systems  Constitutional: Negative for fever.  Respiratory: Negative for shortness of breath.   Cardiovascular: Positive for syncope. Negative for chest pain.  Gastrointestinal: Negative for nausea and vomiting.  Psychiatric/Behavioral: Negative for confusion.  All other systems reviewed and are negative.     Allergies  Other and Sulfa antibiotics  Home Medications   Prior to Admission medications    Medication Sig Start Date End Date Taking? Authorizing Provider  aspirin-acetaminophen-caffeine (EXCEDRIN MIGRAINE) [phone] MG per tablet Take 1 tablet by mouth every 6 (six) hours as needed for migraine.    Yes Historical Provider, MD  atorvastatin (LIPITOR) 10 MG tablet Take 10 mg by mouth at bedtime.  05/04/13  Yes Historical Provider, MD  diazepam (VALIUM) 10 MG tablet Take 1 tablet (10 mg total) by mouth every 6 (six) hours as needed for anxiety. Patient taking differently: Take 10 mg by mouth every 4 (four) hours as needed for anxiety (seizure prevention).  12/29/13  Yes Philmore Pali, NP  docusate sodium (COLACE) 100 MG capsule Take 100 mg by mouth at bedtime.   Yes Historical Provider, MD  fluticasone (FLONASE) 50 MCG/ACT nasal spray Place 1 spray into the nose daily as needed (nose bleeds).  11/17/13  Yes Historical Provider, MD  hydrOXYzine (ATARAX/VISTARIL) 25 MG tablet Take 25 mg by mouth at bedtime as needed (sleep).    Yes Historical Provider, MD  lacosamide (VIMPAT) 200 MG TABS tablet Take 1 tablet (200 mg total) by mouth 2 (two) times daily. 12/29/13  Yes Philmore Pali, NP  levothyroxine (SYNTHROID, LEVOTHROID) 25 MCG tablet Take 25 mcg by mouth daily before breakfast.  05/04/13  Yes Historical Provider, MD  meloxicam (MOBIC) 7.5 MG tablet Take 7.5 mg by mouth 2 (two) times daily.    Yes Historical Provider, MD  rOPINIRole (  REQUIP) 1 MG tablet Take 1 tablet (1 mg total) by mouth 2 (two) times daily. May take extra 1/2 tablet in evening if needed. Patient taking differently: Take 1-1.5 mg by mouth 2 (two) times daily. Take 1 tablet (1 mg) every morning and 1 1/2 tablet (1.5 mg) every night 12/29/13  Yes Philmore Pali, NP  topiramate (TOPAMAX) 100 MG tablet Take 1 tablet (100 mg total) by mouth 2 (two) times daily. 12/29/13  Yes Philmore Pali, NP  vitamin E 200 UNIT capsule Take 200 Units by mouth at bedtime.    Yes Historical Provider, MD   BP 139/73 mmHg  Pulse 64  Temp(Src) 97.7 F (36.5 C)  (Oral)  Resp 22  Ht 5\' 3"  (1.6 m)  Wt 175 lb (79.379 kg)  BMI 31.01 kg/m2  SpO2 100% Physical Exam  Constitutional: She is oriented to person, place, and time. She appears well-developed and well-nourished.  HENT:  Head: Normocephalic and atraumatic.  Right Ear: External ear normal.  Left Ear: External ear normal.  Eyes: Conjunctivae and EOM are normal. Pupils are equal, round, and reactive to light.  Neck: Normal range of motion. Neck supple.  Cardiovascular: Normal rate, regular rhythm, normal heart sounds and intact distal pulses.   Pulmonary/Chest: Effort normal and breath sounds normal.  Abdominal: Soft. Bowel sounds are normal. There is no tenderness.  Musculoskeletal: Normal range of motion.  Neurological: She is alert and oriented to person, place, and time. She has normal strength and normal reflexes. No cranial nerve deficit or sensory deficit. Coordination normal. GCS eye subscore is 4. GCS verbal subscore is 5. GCS motor subscore is 6.  Skin: Skin is warm and dry.  Vitals reviewed.   ED Course  Procedures (including critical care time) Labs Review Labs Reviewed  BASIC METABOLIC PANEL - Abnormal; Notable for the following:    Chloride 114 (*)    Glucose, Bld 110 (*)    BUN 29 (*)    GFR calc non Af Amer 60 (*)    GFR calc Af Amer 70 (*)    Anion gap 3 (*)    All other components within normal limits  CBC  I-STAT TROPOININ, ED    Imaging Review Ct Head Wo Contrast  06/03/2014   CLINICAL DATA:  Initial evaluation for head injury, slipped and fell backwards in bathtub this morning with pain posterior occiput, reports loss of consciousness.  EXAM: CT HEAD WITHOUT CONTRAST  TECHNIQUE: Contiguous axial images were obtained from the base of the skull through the vertex without intravenous contrast.  COMPARISON:  05/24/2013  FINDINGS: No mass lesion. No midline shift. No acute hemorrhage or hematoma. No extra-axial fluid collections. No evidence of acute infarction. No  skull fracture.  IMPRESSION: Normal head CT   Electronically Signed   By: Skipper Cliche M.D.   On: 06/03/2014 15:48     EKG Interpretation   Date/Time:  Sunday June 03 2014 14:07:39 EST Ventricular Rate:  76 PR Interval:  158 QRS Duration: 82 QT Interval:  390 QTC Calculation: 438 R Axis:   -21 Text Interpretation:  Normal sinus rhythm nonspecific ivcd Abnormal ECG No  old tracing to compare Confirmed by Debby Freiberg [phone]) on 06/03/2014  2:57:39 PM      MDM   Final diagnoses:  Syncope and collapse    64 y.o. female with pertinent PMH of atypical seizure vs nonepileptic spells (has both diagnoses in prior charts) presents with recurrent fall just after standing.  It is  typical for her to be near syncopal after standing.  Physical exam benign.  Pt struck her head, but has no concussive symptoms.    Wu unremarkable.  Likely orthostatic syncope vs recurrent seizure, which pt has approximately 1x/month.  DC home in stable condition.    I have reviewed all laboratory and imaging studies if ordered as above  1. Syncope and collapse         Debby Freiberg, MD 06/03/14 1556

## 2014-07-18 ENCOUNTER — Other Ambulatory Visit: Payer: Self-pay

## 2014-07-18 DIAGNOSIS — Z1231 Encounter for screening mammogram for malignant neoplasm of breast: Secondary | ICD-10-CM

## 2014-07-19 ENCOUNTER — Other Ambulatory Visit: Payer: Self-pay | Admitting: Diagnostic Neuroimaging

## 2014-07-21 NOTE — Telephone Encounter (Signed)
Pt never got Rx for Vimpat, called today, has to leave town d/t death in family. Told patient I would call in and apologized for the delay. Called [facility] for Vimpat 200 mg 1 bid, # 60 w 5 ref.

## 2014-07-27 ENCOUNTER — Ambulatory Visit: Payer: Medicaid Other

## 2014-10-08 ENCOUNTER — Ambulatory Visit
Admission: RE | Admit: 2014-10-08 | Discharge: 2014-10-08 | Disposition: A | Discharge status: Home / Self Care | Payer: Medicaid Other | Source: Ambulatory Visit

## 2014-10-08 DIAGNOSIS — Z1231 Encounter for screening mammogram for malignant neoplasm of breast: Secondary | ICD-10-CM

## 2014-12-31 ENCOUNTER — Encounter: Payer: Self-pay | Admitting: Diagnostic Neuroimaging

## 2014-12-31 ENCOUNTER — Ambulatory Visit (INDEPENDENT_AMBULATORY_CARE_PROVIDER_SITE_OTHER): Payer: Medicaid Other | Admitting: Diagnostic Neuroimaging

## 2014-12-31 VITALS — BP 119/81 | HR 72 | Ht 63.0 in | Wt 178.0 lb

## 2014-12-31 DIAGNOSIS — G2581 Restless legs syndrome: Secondary | ICD-10-CM

## 2014-12-31 DIAGNOSIS — G40219 Localization-related (focal) (partial) symptomatic epilepsy and epileptic syndromes with complex partial seizures, intractable, without status epilepticus: Secondary | ICD-10-CM

## 2014-12-31 DIAGNOSIS — G40802 Other epilepsy, not intractable, without status epilepticus: Secondary | ICD-10-CM

## 2014-12-31 DIAGNOSIS — G40909 Epilepsy, unspecified, not intractable, without status epilepticus: Secondary | ICD-10-CM

## 2014-12-31 MED ORDER — LACOSAMIDE 200 MG PO TABS: 200.0000 mg | ORAL_TABLET | Freq: Two times a day (BID) | ORAL | Status: DC

## 2014-12-31 MED ORDER — TOPIRAMATE 100 MG PO TABS: 100.0000 mg | ORAL_TABLET | Freq: Two times a day (BID) | ORAL | Status: DC

## 2014-12-31 NOTE — Progress Notes (Signed)
[facility]  PATIENT: Teresa Nguyen DOB: January 04, 1951  REFERRING CLINICIAN:  HISTORY FROM: patient  REASON FOR VISIT: follow up    HISTORICAL  CHIEF COMPLAINT:  Chief Complaint  Patient presents with  . Seizures    rm 6  . Follow-up    HISTORY OF PRESENT ILLNESS:   UPDATE 12/31/14: Since last visit, doing well, no seizures. Used valium 10mg  x 1 time in the last year for a seizure aura. Tolerating vimpat 200mg  BID + TPX 100mg  BID. Went to ER in Feb for syncope (dx with dehydration).   UPDATE 12/29/13 (LL): Since last visit, went to [facility] to have EMU monitoring. EMU June 15-18, 2015: Long-term video EEG monitoring captured multiple events; including electrographic frontal hypermotor seizures. She also exhibited episodes of oral dyskinetic activity which is not seizure (no EEG correlate). It was concluded that her typical arousal events are consistent with frontal lobe epilepsy. Home medications Vimpat 200 mg twice a day,Topirimate 100 mg twice a day were resumed, but they recommended reducing home dose of Valium as she was taking 10-20 mg when necessary which likely to affects her affect and impairs her communication. She has had several close family members who have died in the last year, including her mother and her sister who died suddenly. Patient is very content on her current medication regimen, stating that she has felt better on Vimpat than ever before. She has not had many nocturnal seizures since starting Vimpat, and states that she was afraid to go to sleep prior to starting it. Her past AEDs have been phenobarbital, phenytoin, Lacosamide, lamotrigine, levetiracetam, Topirimate, valproic acid and zonisamide. Known seizure triggers are eating broccoli or eggplant. RLS symptoms are controlled on Ropinirole, although some nights she must take an extra 0.25 of a tablet to be comfortable.  UPDATE 12/28/12: Outside neurologist records received and reviewed. Initially  dx'd with epilepsy, but then changed to dx of non-epileptic spells. Since last visit, no more seizures or spells. Her RLS is getting worse, b/c she is running out of ropinirole and was reducing the dose. Still with high stress. Not interested in seeing psychiatry.   UPDATE 06/10/12: Since last visit, sz stopped, until Dec 2013. She was preparing for colonoscopy, and anticipated having diarrhea from bowel prep. Before she took the bowel prep meds, she started with diarrhea. Soon after she had a seizure (no LOC, eye up, head turned, mouth twitching; no generalized convulsions. she could hear and move, but not talk). She took a valium, and her spell stopped. I still haven't received records from prior neurologist. Patient tells me that she was dx'd with "partial non-epileptic seizures". She was told to take valium prn to stop her seizures at home, in order to avoid freq ER trips.   PRIOR HPI (12/04/11): 64 year old right-handed female here for evaluation of seizure disorder. At age 62 years old, patient fell down a flight of stairs. Soon after she began to have "petit mall seizures". Patient states she was having inability to speak, convulsions and tongue biting. She started on Dilantin and phenobarbital early in life. Over the years she tried "many different seizure medications". She was living in [place] for most of her life and followed by neurology there. She had extensive testing with EEG, video EEG, sleep study, MRI, without any significant abnormalities. Unclear if any of these seizures were captured during EEG recordings. It sounds like no epileptiform discharges were ever noted. Unfortunately I do not have any of her prior records to  review myself for this visit. Patient has been on Vimpat and topiramate over the past year, and now has one episode every 3 weeks of "seizure". Current episodes consist of gagging sensation, inability to speak, without loss of consciousness tongue biting or convulsions.     REVIEW OF SYSTEMS: Full 14 system review of systems performed and notable only for bruising food allergies excessive thirst constipation diarrhea restless ringing trouble swallowing.   ALLERGIES: Allergies  Allergen Reactions  . Other Diarrhea    Nuts and dark green vegetables cause diarrhea and dehydration  . Sulfa Antibiotics Rash    Red all over    HOME MEDICATIONS: Outpatient Prescriptions Prior to Visit  Medication Sig Dispense Refill  . aspirin-acetaminophen-caffeine (EXCEDRIN MIGRAINE) 250-250-65 MG per tablet Take 1 tablet by mouth every 6 (six) hours as needed for migraine.     Marland Kitchen atorvastatin (LIPITOR) 10 MG tablet Take 10 mg by mouth at bedtime.     . diazepam (VALIUM) 10 MG tablet Take 1 tablet (10 mg total) by mouth every 6 (six) hours as needed for anxiety. (Patient taking differently: Take 10 mg by mouth every 4 (four) hours as needed for anxiety (seizure prevention). ) 45 tablet 5  . docusate sodium (COLACE) 100 MG capsule Take 100 mg by mouth at bedtime.    Marland Kitchen lacosamide (VIMPAT) 200 MG TABS tablet Take 1 tablet (200 mg total) by mouth 2 (two) times daily. 60 tablet 5  . levothyroxine (SYNTHROID, LEVOTHROID) 25 MCG tablet Take 25 mcg by mouth daily before breakfast.     . meloxicam (MOBIC) 7.5 MG tablet Take 7.5 mg by mouth 2 (two) times daily.     Marland Kitchen rOPINIRole (REQUIP) 1 MG tablet Take 1 tablet (1 mg total) by mouth 2 (two) times daily. May take extra 1/2 tablet in evening if needed. (Patient taking differently: Take 1-1.5 mg by mouth 2 (two) times daily. Take 1 tablet (1 mg) every morning and 1 1/2 tablet (1.5 mg) every night) 90 tablet 12  . topiramate (TOPAMAX) 100 MG tablet Take 1 tablet (100 mg total) by mouth 2 (two) times daily. 180 tablet 3  . vitamin E 200 UNIT capsule Take 200 Units by mouth at bedtime.     . fluticasone (FLONASE) 50 MCG/ACT nasal spray Place 1 spray into the nose daily as needed (nose bleeds).     . hydrOXYzine (ATARAX/VISTARIL) 25 MG tablet  Take 25 mg by mouth at bedtime as needed (sleep).      No facility-administered medications prior to visit.    PAST MEDICAL HISTORY: Past Medical History  Diagnosis Date  . 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 Seizure disorder     since age 23-3  . Migraine   . Thyroid disease   . Arthritis     PAST SURGICAL HISTORY: Past Surgical History  Procedure Laterality Date  . Breast lumpectomy  1990  . Tubal ligation      FAMILY HISTORY: Family History  Problem Relation Age of Onset  . Stroke Father   . Heart attack Father   . Throat cancer Sister   . Seizures Sister   . Migraines Sister   . Diabetes Maternal Grandmother   . Diabetes Maternal Grandfather     SOCIAL HISTORY:  Social History   Social History  . Marital Status: Divorced    Spouse Name: N/A  . Number of Children: 2  . Years of Education: 12th   Occupational History  . disabled  Social History Main Topics  . Smoking status: Former Smoker    Quit date: 04/16/1998  . Smokeless tobacco: Not on file  . Alcohol Use: No  . Drug Use: No  . Sexual Activity: Not Currently   Other Topics Concern  . Not on file   Social History Narrative   Patient lives at home alone.   Caffeine Use: 1 cup of coffee daily     PHYSICAL EXAM  GENERAL EXAM/CONSTITUTIONAL: Vitals:  Filed Vitals:   12/31/14 1333  BP: 119/81  Pulse: 72  Height: 5\' 3"  (1.6 m)  Weight: 178 lb (80.74 kg)     Body mass index is 31.54 kg/(m^2).  No exam data present  Patient is in no distress; well developed, nourished and groomed; neck is supple  CARDIOVASCULAR:  Examination of carotid arteries is normal; no carotid bruits  Regular rate and rhythm, no murmurs  Examination of peripheral vascular system by observation and palpation is normal  EYES:  Ophthalmoscopic exam of optic discs and posterior segments is normal; no papilledema or hemorrhages  MUSCULOSKELETAL:  Gait, strength, tone, movements noted in Neurologic exam  below  NEUROLOGIC: MENTAL STATUS:  No flowsheet data found.  awake, alert, oriented to person, place and time  recent and remote memory intact  normal attention and concentration  language fluent, comprehension intact, naming intact,   fund of knowledge appropriate  CRANIAL NERVE:   2nd - no papilledema on fundoscopic exam  2nd, 3rd, 4th, 6th - pupils equal and reactive to light, visual fields full to confrontation, extraocular muscles intact, no nystagmus  5th - facial sensation symmetric  7th - facial strength symmetric  8th - hearing intact  9th - palate elevates symmetrically, uvula midline  11th - shoulder shrug symmetric  12th - tongue protrusion midline  MOTOR:   normal bulk and tone, full strength in the BUE, BLE  SENSORY:   normal and symmetric to light touch, temperature, vibration  COORDINATION:   finger-nose-finger, fine finger movements normal  REFLEXES:   deep tendon reflexes present and symmetric  GAIT/STATION:   narrow based gait     DIAGNOSTIC DATA (LABS, IMAGING, TESTING) - I reviewed patient records, labs, notes, testing and imaging myself where available.  Lab Results  Component Value Date   WBC 5.4 06/03/2014   HGB 13.9 06/03/2014   HCT 41.5 06/03/2014   MCV 90.4 06/03/2014   PLT 175 06/03/2014      Component Value Date/Time   NA 142 06/03/2014 1434   K 3.8 06/03/2014 1434   CL 114* 06/03/2014 1434   CO2 25 06/03/2014 1434   GLUCOSE 110* 06/03/2014 1434   BUN 29* 06/03/2014 1434   CREATININE 0.99 06/03/2014 1434   CALCIUM 9.3 06/03/2014 1434   GFRNONAA 60* 06/03/2014 1434   GFRAA 70* 06/03/2014 1434   No results found for: CHOL, HDL, LDLCALC, LDLDIRECT, TRIG, CHOLHDL No results found for: HGBA1C No results found for: VITAMINB12 No results found for: TSH  06/03/14 CT head [I reviewed images myself and agree with interpretation. -VRP]  - normal   05/24/13 MRI brain [I reviewed images myself and agree with  interpretation. -VRP]  1. No acute intracranial abnormality. 2. Volume loss and signal changes in the left mesial temporal lobe compatible with mesial temporal sclerosis. 3. Otherwise negative MRI appearance of the brain.    ASSESSMENT AND PLAN  64 y.o. year old female here with history of seizure disorder since age 60-65 years old, diagnosed at [facility] in  June 2015 with frontal lobe epilepsy through VEEG. Last seizure on June 2015 (during [facility] video EEG).   PLAN:  - Continue vimpat 200mg  BID  - Continue topiramate 100mg  BID - Continue diazepam 10mg  prn for seizure aura - Continue ropinirole (1mg  in AM, 1.5mg  in PM) for RLS  Meds ordered this encounter  Medications  . lacosamide (VIMPAT) 200 MG TABS tablet    Sig: Take 1 tablet (200 mg total) by mouth 2 (two) times daily.    Dispense:  60 tablet    Refill:  5    Patient meets PA criteria Pharmacy Fax [phone]  . topiramate (TOPAMAX) 100 MG tablet    Sig: Take 1 tablet (100 mg total) by mouth 2 (two) times daily.    Dispense:  180 tablet    Refill:  4   Return in about 1 year (around 12/31/2015).    Penni Bombard, MD 08/09/7679, 1:57 PM Certified in Neurology, Neurophysiology and Neuroimaging  [facility] [address], [place], [place] [address] [phone]

## 2014-12-31 NOTE — Addendum Note (Signed)
Addended byAndrey Spearman on: 12/31/2014 02:45 PM   Modules accepted: Orders

## 2015-01-14 ENCOUNTER — Telehealth: Payer: Self-pay | Admitting: *Deleted

## 2015-01-14 NOTE — Telephone Encounter (Addendum)
Left vm requesting call back re: Vimpat.   4:14 pm  Spoke with patient who states she got "her last SSI check and her first SS check in the same week". She states [facility] informed her that Medicare advised "she has too much money" and will take 48 hours to process her information; until that time [facility] cannot [facility] medication. Patient is requesting Dr Leta Baptist assist with this. She states she has enough medication for 5 days.  Informed her that this RN will talk with Janett Billow, pharm tech and call her back. Patient requests not to be called back until tomorrow. Spoke with Janett Billow who states patient probably had lapse in insurance, and Medicare is re qualifying patient. Janett Billow stated that patient may need to be given samples if she does not get Medicare approval in time. Will discuss with Dr Leta Baptist and call patient back tomorrow.  Spoke with Ellan Lambert, Vimpat rep, and she states she will come by office tomorrow and bring samples for patient.

## 2015-01-15 NOTE — Telephone Encounter (Signed)
Thank you. I signed for samples today. -VRP

## 2015-01-15 NOTE — Telephone Encounter (Signed)
Teresa Nguyen, Vimpat drug rep in office today. Dr Leta Baptist signed for this office to receive sample packets of 200 mg tabs which should arrive by Friday.  Spoke with patient and informed her samples should arrive by Friday, and she will receive call to come and pick up samples. Also informed her that if 200 mg tabs not received, this office will give her 100 mg tabs, and she will adjust number of tabs she takes to take her full prescription dose.  She verbalized understanding, appreciation for call.

## 2015-01-16 ENCOUNTER — Telehealth: Payer: Self-pay | Admitting: *Deleted

## 2015-01-16 ENCOUNTER — Encounter: Payer: Self-pay | Admitting: *Deleted

## 2015-01-16 NOTE — Telephone Encounter (Signed)
Per Dr Leta Baptist, called patient and informed her Dr would like her to come in this week for Vimpat 100 mg samples to insure she will have medication through the weekend. Informed her that when 200 mg tabs arrive, this office will call her so she can come pick up if she still needs them. She verbalized understanding and stated she would come this afternoon.

## 2015-01-16 NOTE — Progress Notes (Signed)
Vimpat samples available at front desk for patient to pick up.

## 2015-01-17 NOTE — Telephone Encounter (Signed)
Spoke to pt. Advised her that the vimpat 200mg  samples were available for pick up when she needed them. Pt has already picked up the vimpat 100 mg samples. She understands to pick up the vimpat 200 mg samples when she needs them.

## 2015-01-17 NOTE — Telephone Encounter (Signed)
Called patient to inform. Vimpat 200mg  samples @ front desk for pick up. No answer.

## 2015-01-17 NOTE — Telephone Encounter (Signed)
This encounter was created in error - please disregard.

## 2015-01-19 ENCOUNTER — Other Ambulatory Visit: Payer: Self-pay | Admitting: Diagnostic Neuroimaging

## 2015-01-22 NOTE — Telephone Encounter (Signed)
Pt called and states that the cost of vimpat is too much. She was advised that there are samples waiting for her at the front desk. She expressed understanding and stated she would come come by to pick them up.

## 2015-01-24 ENCOUNTER — Telehealth: Payer: Self-pay

## 2015-01-24 NOTE — Telephone Encounter (Signed)
Duplicate task.  Please see next encounter  

## 2015-01-24 NOTE — Telephone Encounter (Addendum)
Pt called stating she went to pharmacy to pick up lacosamide (VIMPAT) 200 MG TABS tablet but MD did not approve it yet. Pharmacist suggested ins co be called again. Please call pt at [phone] after talking with MD.

## 2015-01-24 NOTE — Telephone Encounter (Signed)
Medicaid has approved the request for coverage on Vimpat effective until 01/19/2016 Ref # [id number].   I spoke with the pharmacy who verified the Rx does process without any issues.  I spoke with the patient to advise.  She is aware.

## 2015-03-05 ENCOUNTER — Other Ambulatory Visit: Payer: Self-pay | Admitting: Internal Medicine

## 2015-03-05 DIAGNOSIS — R5381 Other malaise: Secondary | ICD-10-CM

## 2015-03-06 ENCOUNTER — Other Ambulatory Visit: Payer: Self-pay | Admitting: Internal Medicine

## 2015-03-06 DIAGNOSIS — E2839 Other primary ovarian failure: Secondary | ICD-10-CM

## 2015-03-14 ENCOUNTER — Encounter: Payer: Self-pay | Admitting: Dietician

## 2015-03-14 ENCOUNTER — Encounter: Payer: Medicaid Other | Attending: Internal Medicine | Admitting: Dietician

## 2015-03-14 DIAGNOSIS — Z713 Dietary counseling and surveillance: Secondary | ICD-10-CM | POA: Diagnosis present

## 2015-03-14 DIAGNOSIS — R630 Anorexia: Secondary | ICD-10-CM | POA: Diagnosis not present

## 2015-03-14 DIAGNOSIS — K5900 Constipation, unspecified: Secondary | ICD-10-CM | POA: Insufficient documentation

## 2015-03-14 NOTE — Progress Notes (Signed)
  Medical Nutrition Therapy:  Appt start time: 1500 end time:  [id number].   Assessment:  Primary concerns today: decreased appetite, dry mouth, constipation, and diarrhea. Patient states that for the last 2 years she has had diarrhea and gas following intake of eggplant, dark green vegetables, and other high fiber foods.  She has cut out many of these foods from her diet like broccoli, asparagus, eggplant, etc.  She reports having side-effect of constipation from her medications and takes a stool softner nightly.  She also complains of dry mouth, also likely a side effect of her medications which has affected her appetite and intake in the last few months.  Her main concern today is decreasing the occurences of diarrhea which, as she states, often triggers her seizure disorder.  She does her own food shopping and cooking and is currently not exercising.  Her weight has been maintained over the last few years.  Preferred Learning Style:  No preference indicated   Learning Readiness:  Contemplating  MEDICATIONS: see list   DIETARY INTAKE:  Usual eating pattern includes 3 meals and 0-1 snacks per day.  24-hr recall:  B ( AM): oatmeal and cup of coffee  L ( PM): roast beef sandwich with lettuce and tomatoes, water D ( PM): chicken thighs with cream of chicken & mushroom soup and mushrooms with mashed potatoes or macaroni & cheese  Usual physical activity: none  Progress Towards Goal(s):  In progress.   Nutritional Diagnosis:  NB-1.1 Food and nutrition-related knowledge deficit As related to no prior formal nutrition education.  As evidenced by patient report and request for help to better manage symptoms with diet.    Intervention:  Nutrition education and counseling.  Action Plan: Keep a Food and Symptoms diary - write down everything you eat and drink and any digestive discomfort symptoms you may have, use this journal to identify trends and trigger foods. Use the Low-Fiber Nutrition  Therapy handout to choose recommended foods and avoid not recommended foods.  Use your food diary to identify foods that you individually react to and remove these from your usual diet. You may add foods from the "not recommended" list back into your diet one at a time to determine if they are okay for you i.e. do not cause symptoms. During spells of diarrhea it is especially important to stay hydrated, drink lots of water, and avoid fatty, greasy foods. Take a women's multivitamin daily. Consider taking a probiotic with 1 billion active cultures or more.  Teaching Method Utilized: Visual Auditory Hands on  Handouts given during visit include:  Low-Fiber Nutrition Therapy  Barriers to learning/adherence to lifestyle change: none  Demonstrated degree of understanding via:  Teach Back   Monitoring/Evaluation:  Dietary intake, exercise, GI symptoms, and body weight prn.

## 2015-03-25 ENCOUNTER — Telehealth: Payer: Self-pay | Admitting: Diagnostic Neuroimaging

## 2015-03-25 NOTE — Telephone Encounter (Signed)
Patient called to request lacosamide (VIMPAT) 200 MG TABS tablet samples, states she is having trouble with Medicaid account again, they're telling her account is not active.

## 2015-03-25 NOTE — Telephone Encounter (Signed)
Spoke with patient and informed her per Epic record that [employer], [facility]PT [facility] on [facility], and Medicaid approved coverage for Vimpat through 01/19/2016. Gave pt reference #. She stated [facility] would [facility]. She verbalized understanding, appreciation for call.

## 2015-03-28 NOTE — Telephone Encounter (Addendum)
Spoke with patient and informed her that the office has limited supply of Vimpat samples but due to receive more next Wed. She states she has enough medication to take thru 12/19, but she is going out of town. She requests med samples to last her thru next Fri. Informed her would get samples ready for her to pick up today at front desk; she knows to bring photo ID and is aware of office hours today and tomorrow. She stated she will come in this afternoon. She then stated her insurance should be active again on 04/16/15. She stated she will need samples until then, so she will call back when she comes back in town to pick up more samples. She verbalized understanding and appreciation of call.  Samples to carry patient through 03/31/15 at front desk with instructions on how to take.

## 2015-03-28 NOTE — Telephone Encounter (Signed)
Patient called to advise her Medicaid has been suspended, patient requests samples of Vimpat, will be going out of town Sunday morning for 3 days.

## 2015-03-29 ENCOUNTER — Other Ambulatory Visit: Payer: Medicaid Other

## 2015-06-24 ENCOUNTER — Ambulatory Visit
Admission: RE | Admit: 2015-06-24 | Discharge: 2015-06-24 | Disposition: A | Discharge status: Home / Self Care | Payer: Medicaid Other | Source: Ambulatory Visit | Attending: Internal Medicine | Admitting: Internal Medicine

## 2015-06-24 DIAGNOSIS — E2839 Other primary ovarian failure: Secondary | ICD-10-CM

## 2015-07-02 ENCOUNTER — Other Ambulatory Visit: Payer: Self-pay | Admitting: Diagnostic Neuroimaging

## 2015-07-03 NOTE — Telephone Encounter (Signed)
[facility]. Received a receipt of confirmation.

## 2015-09-16 ENCOUNTER — Other Ambulatory Visit: Payer: Self-pay | Admitting: Internal Medicine

## 2015-09-16 DIAGNOSIS — Z1231 Encounter for screening mammogram for malignant neoplasm of breast: Secondary | ICD-10-CM

## 2015-10-08 ENCOUNTER — Ambulatory Visit
Admission: RE | Admit: 2015-10-08 | Discharge: 2015-10-08 | Disposition: A | Discharge status: Home / Self Care | Payer: Medicaid Other | Source: Ambulatory Visit | Attending: Internal Medicine | Admitting: Internal Medicine

## 2015-10-08 DIAGNOSIS — Z1231 Encounter for screening mammogram for malignant neoplasm of breast: Secondary | ICD-10-CM

## 2015-10-09 ENCOUNTER — Ambulatory Visit: Payer: Medicaid Other

## 2015-10-28 ENCOUNTER — Other Ambulatory Visit: Payer: Self-pay | Admitting: Neurology

## 2015-10-28 ENCOUNTER — Telehealth: Payer: Self-pay | Admitting: Diagnostic Neuroimaging

## 2015-10-28 MED ORDER — ROPINIROLE HCL 1 MG PO TABS: ORAL_TABLET | ORAL | Status: DC

## 2015-10-28 NOTE — Telephone Encounter (Signed)
Patient requesting refill of rOPINIRole (REQUIP) 1 MG tablet . She is requesting to increase the dose. Please call  [facility] [address]

## 2015-10-28 NOTE — Telephone Encounter (Signed)
Spoke with patient who stated that she recently stayed with her sister who noticed that the patient's RLS was worse than in the past. Ms Brawdy stated that when she went ot bed it was even worse than when she was awake. She is requesting an increase in her Requip. Informed her Dr Leta Baptist is out of the office until July 27th, but this RN will route her request to Dr Leonie Man, work in dr. Durward Fortes she call back tomorrow afternoon if she has not heard from her pharmacy. Confirmed that she is taking 1 mg every morning, 1.5 mg every evening. [facility] [place]. She verbalized understanding, appreciation.

## 2015-10-28 NOTE — Telephone Encounter (Signed)
Okay to increase the dose of Requip to 1 mg in the morning and 2 mg at night and I will order it

## 2015-10-28 NOTE — Telephone Encounter (Signed)
Agree with increasing Requip to 1 mg in the morning and 1.5 mg at night

## 2015-10-29 NOTE — Telephone Encounter (Signed)
LVM informing patient a new prescription was sent to her pharmacy last night, to increase Requip at night. Left name, number for any questions.

## 2015-11-07 ENCOUNTER — Telehealth: Payer: Self-pay | Admitting: *Deleted

## 2015-11-07 NOTE — Telephone Encounter (Signed)
Patient is calling back stating she needs a new Rx called in for rOPINIRole (REQUIP) 1 MG tablet #90. [facility] [address].

## 2015-11-07 NOTE — Telephone Encounter (Signed)
Spoke with Bryson Ha[facility]ip with new dose at night of 2 mg per Dr Leonie Man. Gave verbal order disp #90 with 10 refills. Bryson Ha verbalized understanding.

## 2015-11-07 NOTE — Telephone Encounter (Signed)
Spoke with patient re: Requip prescription. Advised her that Dr Clydene Fake refill included a note to the pharmacist for her to take 1 mg in the morning and 2 mg at night. Advised she may take the medication as Dr Leonie Man prescribed. She stated she would call her pharmacy to discuss the fact that her new bottle does not reflect that increase in the instructions. Reminded her of FU in Sept, and that she can be seen sooner if needed. She verbalized understanding, appreciation.

## 2015-11-07 NOTE — Telephone Encounter (Signed)
Pt called in after picking up Requip rx. She noticed the direction and dosage are the same as before. Please call and advise

## 2015-12-31 ENCOUNTER — Encounter: Payer: Self-pay | Admitting: Diagnostic Neuroimaging

## 2015-12-31 ENCOUNTER — Ambulatory Visit (INDEPENDENT_AMBULATORY_CARE_PROVIDER_SITE_OTHER): Payer: Medicaid Other | Admitting: Diagnostic Neuroimaging

## 2015-12-31 VITALS — BP 140/74 | HR 64 | Wt 180.0 lb

## 2015-12-31 DIAGNOSIS — G40219 Localization-related (focal) (partial) symptomatic epilepsy and epileptic syndromes with complex partial seizures, intractable, without status epilepticus: Secondary | ICD-10-CM

## 2015-12-31 DIAGNOSIS — G40802 Other epilepsy, not intractable, without status epilepticus: Secondary | ICD-10-CM

## 2015-12-31 DIAGNOSIS — G2581 Restless legs syndrome: Secondary | ICD-10-CM

## 2015-12-31 MED ORDER — TOPIRAMATE 100 MG PO TABS: 100.0000 mg | ORAL_TABLET | Freq: Two times a day (BID) | ORAL | 4 refills | Status: DC

## 2015-12-31 MED ORDER — DIAZEPAM 10 MG PO TABS: 10.0000 mg | ORAL_TABLET | Freq: Every day | ORAL | 4 refills | Status: DC | PRN

## 2015-12-31 MED ORDER — ROPINIROLE HCL 1 MG PO TABS: ORAL_TABLET | ORAL | 12 refills | Status: DC

## 2015-12-31 MED ORDER — LACOSAMIDE 200 MG PO TABS: 200.0000 mg | ORAL_TABLET | Freq: Two times a day (BID) | ORAL | 5 refills | Status: DC

## 2015-12-31 NOTE — Progress Notes (Signed)
[facility]  PATIENT: Teresa Nguyen DOB: 1951/01/01  REFERRING CLINICIAN:  HISTORY FROM: patient  REASON FOR VISIT: follow up    HISTORICAL  CHIEF COMPLAINT:  Chief Complaint  Patient presents with  . Seizures    rm 7, patial symptomatic epilepsy, "Vimpat messes with my words and memory. I forget to take it. I had a seizure at 4 am today because I forgot to take it last night."  . Follow-up    one year    HISTORY OF PRESENT ILLNESS:   UPDATE 12/31/15: Last night forgot vimpat dose; this morning at 4am, woke up, had to urinate, stood up and then fell back on to bed, may have had brief LOC. Thinks she had a seizure. No post-ictal confusion.  No tongue biting. Restless legs stable with medication.   UPDATE 12/31/14: Since last visit, doing well, no seizures. Used valium 10mg  x 1 time in the last year for a seizure aura. Tolerating vimpat 200mg  BID + TPX 100mg  BID. Went to ER in Feb for syncope (dx with dehydration).   UPDATE 12/29/13 (LL): Since last visit, went to [facility] to have EMU monitoring. EMU report --> June 15-18, 2015: "Long-term video EEG monitoring captured multiple events; including electrographic frontal hypermotor seizures. She also exhibited episodes of oral dyskinetic activity which is not seizure (no EEG correlate)." It was concluded that her typical arousal events are consistent with frontal lobe epilepsy. Home medications Vimpat 200 mg twice a day,Topirimate 100 mg twice a day were resumed, but they recommended reducing home dose of Valium as she was taking 10-20 mg when necessary which likely to affects her affect and impairs her communication. She has had several close family members who have died in the last year, including her mother and her sister who died suddenly. Patient is very content on her current medication regimen, stating that she has felt better on Vimpat than ever before. She has not had many nocturnal seizures since starting Vimpat, and  states that she was afraid to go to sleep prior to starting it. Her past AEDs have been phenobarbital, phenytoin, Lacosamide, lamotrigine, levetiracetam, Topirimate, valproic acid and zonisamide. Known seizure triggers are eating broccoli or eggplant. RLS symptoms are controlled on Ropinirole, although some nights she must take an extra 0.25 of a tablet to be comfortable.  UPDATE 12/28/12: Outside neurologist records received and reviewed. 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 Since last visit, no more seizures or spells. Her RLS is getting worse, b/c she is running out of ropinirole and was reducing the dose. Still with high stress. Not interested in seeing psychiatry.   UPDATE 06/10/12: Since last visit, sz stopped, until Dec 2013. She was preparing for colonoscopy, and anticipated having diarrhea from bowel prep. Before she took the bowel prep meds, she started with diarrhea. Soon after she had a seizure (no LOC, eye up, head turned, mouth twitching; no generalized convulsions. she could hear and move, but not talk). She took a valium, and her spell stopped. I still haven't received records from prior neurologist. Patient tells me that she was dx'd with "partial non-epileptic seizures". She was told to take valium prn to stop her seizures at home, in order to avoid freq ER trips.   PRIOR HPI (12/04/11): 65 year old right-handed female here for evaluation of seizure disorder. At age 16 years old, patient fell down a flight of stairs. Soon after she began to have "petit mall seizures". Patient states she was  having inability to speak, convulsions and tongue biting. She started on Dilantin and phenobarbital early in life. Over the years she tried "many different seizure medications". She was living in [place] for most of her life and followed by neurology there. She had extensive testing with EEG, video EEG, sleep study, MRI, without any significant abnormalities. Unclear  if any of these seizures were captured during EEG recordings. It sounds like no epileptiform discharges were ever noted. Unfortunately I do not have any of her prior records to review myself for this visit. Patient has been on Vimpat and topiramate over the past year, and now has one episode every 3 weeks of "seizure". Current episodes consist of gagging sensation, inability to speak, without loss of consciousness tongue biting or convulsions.    REVIEW OF SYSTEMS: Full 14 system review of systems performed and negative except: memory loss seizure food allergies restless legs joint pain.    ALLERGIES: Allergies  Allergen Reactions  . Other Diarrhea    Nuts and dark green vegetables cause diarrhea and dehydration  . Sulfa Antibiotics Rash    Red all over    HOME MEDICATIONS: Outpatient Medications Prior to Visit  Medication Sig Dispense Refill  . aspirin-acetaminophen-caffeine (EXCEDRIN MIGRAINE) 250-250-65 MG per tablet Take 1 tablet by mouth every 6 (six) hours as needed for migraine.     Marland Kitchen atorvastatin (LIPITOR) 10 MG tablet Take 10 mg by mouth at bedtime.     . Calcium Carb-Cholecalciferol (CALCIUM + D3) 600-200 MG-UNIT TABS Take by mouth. Calcium 600 mg + Vit D, minerlas    . diazepam (VALIUM) 10 MG tablet Take 1 tablet (10 mg total) by mouth every 6 (six) hours as needed for anxiety. (Patient taking differently: Take 10 mg by mouth every 4 (four) hours as needed for anxiety (seizure prevention). ) 45 tablet 5  . docusate sodium (COLACE) 100 MG capsule Take 100 mg by mouth at bedtime.    Marland Kitchen levothyroxine (SYNTHROID, LEVOTHROID) 25 MCG tablet Take 25 mcg by mouth daily before breakfast.     . meloxicam (MOBIC) 7.5 MG tablet Take 7.5 mg by mouth 2 (two) times daily.     . methocarbamol (ROBAXIN) 750 MG tablet Take 750 mg by mouth 2 (two) times daily as needed.  2  . rOPINIRole (REQUIP) 1 MG tablet 1mg  in AM, 1.5mg  in PM (Patient taking differently: 1mg  in AM, 1.5mg  in PM Per Dr Leonie Man,  take 1 mg in morning, 2 mg at night) 75 tablet 11  . topiramate (TOPAMAX) 100 MG tablet Take 1 tablet (100 mg total) by mouth 2 (two) times daily. 180 tablet 4  . VIMPAT 200 MG TABS tablet TAKE 1 TABLET TWICE A DAY 60 tablet 5  . vitamin E 200 UNIT capsule Take 200 Units by mouth at bedtime.      No facility-administered medications prior to visit.     PAST MEDICAL HISTORY: Past Medical History:  Diagnosis Date  . Arthritis   . 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 Migraine   . Seizure disorder [facility])    since age 12-3  . Seizures ([place])    last sz 12/31/15  . Thyroid disease     PAST SURGICAL HISTORY: Past Surgical History:  Procedure Laterality Date  . BREAST LUMPECTOMY  1990  . TUBAL LIGATION      FAMILY HISTORY: Family History  Problem Relation Age of Onset  . Stroke Father   . Heart attack Father   . Throat  cancer Sister   . Seizures Sister   . Migraines Sister   . Diabetes Maternal Grandmother   . Diabetes Maternal Grandfather   . Kidney Stones Sister     SOCIAL HISTORY:  Social History   Social History  . Marital status: Divorced    Spouse name: N/A  . Number of children: 2  . Years of education: 12th   Occupational History  . disabled    Social History Main Topics  . Smoking status: Former Smoker    Quit date: 04/16/1998  . Smokeless tobacco: Never Used  . Alcohol use No  . Drug use: No  . Sexual activity: Not Currently   Other Topics Concern  . Not on file   Social History Narrative   Patient lives at home alone.   Caffeine Use: 1 cup of coffee daily     PHYSICAL EXAM  GENERAL EXAM/CONSTITUTIONAL: Vitals:  Vitals:   12/31/15 1339  BP: 140/74  Pulse: 64  Weight: 180 lb (81.6 kg)   Body mass index is 31.89 kg/m. No exam data present  Patient is in no distress; well developed, nourished and groomed; neck is supple  CARDIOVASCULAR:  Examination of carotid arteries is normal; no carotid bruits  Regular rate and rhythm, no  murmurs  Examination of peripheral vascular system by observation and palpation is normal  EYES:  Ophthalmoscopic exam of optic discs and posterior segments is normal; no papilledema or hemorrhages  MUSCULOSKELETAL:  Gait, strength, tone, movements noted in Neurologic exam below  NEUROLOGIC: MENTAL STATUS:  No flowsheet data found.  awake, alert, oriented to person, place and time  recent and remote memory intact  normal attention and concentration  language fluent, comprehension intact, naming intact,   fund of knowledge appropriate  CRANIAL NERVE:   2nd - no papilledema on fundoscopic exam  2nd, 3rd, 4th, 6th - pupils equal and reactive to light, visual fields full to confrontation, extraocular muscles intact, no nystagmus  5th - facial sensation symmetric  7th - facial strength symmetric  8th - hearing intact  9th - palate elevates symmetrically, uvula midline  11th - shoulder shrug symmetric  12th - tongue protrusion midline  MOTOR:   normal bulk and tone, full strength in the BUE, BLE  SENSORY:   normal and symmetric to light touch, temperature, vibration  COORDINATION:   finger-nose-finger, fine finger movements normal  REFLEXES:   deep tendon reflexes present and symmetric  GAIT/STATION:   narrow based gait     DIAGNOSTIC DATA (LABS, IMAGING, TESTING) - I reviewed patient records, labs, notes, testing and imaging myself where available.  Lab Results  Component Value Date   WBC 5.4 06/03/2014   HGB 13.9 06/03/2014   HCT 41.5 06/03/2014   MCV 90.4 06/03/2014   PLT 175 06/03/2014      Component Value Date/Time   NA 142 06/03/2014 1434   K 3.8 06/03/2014 1434   CL 114 (H) 06/03/2014 1434   CO2 25 06/03/2014 1434   GLUCOSE 110 (H) 06/03/2014 1434   BUN 29 (H) 06/03/2014 1434   CREATININE 0.99 06/03/2014 1434   CALCIUM 9.3 06/03/2014 1434   GFRNONAA 60 (L) 06/03/2014 1434   GFRAA 70 (L) 06/03/2014 1434   No results found for:  CHOL, HDL, LDLCALC, LDLDIRECT, TRIG, CHOLHDL No results found for: HGBA1C No results found for: VITAMINB12 No results found for: TSH  06/03/14 CT head [I reviewed images myself and agree with interpretation. -VRP]  - normal   05/24/13 MRI  brain [I reviewed images myself and agree with interpretation. -VRP]  1. No acute intracranial abnormality. 2. Volume loss and signal changes in the left mesial temporal lobe compatible with mesial temporal sclerosis. 3. Otherwise negative MRI appearance of the brain.    ASSESSMENT AND PLAN  65 y.o. year old female here with history of seizure disorder since age 83-3 years old, diagnosed at [facility] in June 2015 with frontal lobe epilepsy through VEEG. Last seizure on June 2015 (during [facility] video EEG). Also with atypical event on 12/31/15 (syncope vs seizure)   Dx:  Partial symptomatic epilepsy with complex partial seizures, intractable, without status epilepticus ([place])  Restless leg syndrome  Frontal lobe epilepsy (HCC)    PLAN:  - Continue vimpat 200mg  BID  - Continue topiramate 100mg  BID - Continue diazepam 10mg  prn for seizure aura - Continue ropinirole (1mg  in AM, 2mg  in PM) for RLS - no driving until seizure free x 3 months (shorted from 6 months due to provoking factor and unclear event)  Meds ordered this encounter  Medications  . rOPINIRole (REQUIP) 1 MG tablet    Sig: 1mg  in AM and 2mg  in PM    Dispense:  90 tablet    Refill:  12  . topiramate (TOPAMAX) 100 MG tablet    Sig: Take 1 tablet (100 mg total) by mouth 2 (two) times daily.    Dispense:  180 tablet    Refill:  4  . lacosamide (VIMPAT) 200 MG TABS tablet    Sig: Take 1 tablet (200 mg total) by mouth 2 (two) times daily.    Dispense:  60 tablet    Refill:  5  . diazepam (VALIUM) 10 MG tablet    Sig: Take 1 tablet (10 mg total) by mouth daily as needed for anxiety.    Dispense:  30 tablet    Refill:  4   Return in about 1 year (around  12/30/2016).    Penni Bombard, MD [id number], 0000000 PM Certified in Neurology, Neurophysiology and Neuroimaging  [facility] [address], [place], [place] [address] [phone]

## 2015-12-31 NOTE — Patient Instructions (Signed)
Continue current medications.  No driving until 3 months seizure free.

## 2016-01-01 ENCOUNTER — Telehealth: Payer: Self-pay | Admitting: Diagnostic Neuroimaging

## 2016-01-01 NOTE — Telephone Encounter (Signed)
Spoke with pharmacist, Shirlean Mylar at [facility] who stated she is filling the prescription at this time. She stated the patient will get an automated phone call when the prescription is ready. She verbalized understanding of call.

## 2016-01-01 NOTE — Telephone Encounter (Signed)
Patient is calling to get a new Rx for diazepam (VALIUM) 10 MG tablet called to [facility] on [address]. She says the pharmacy has not received it.

## 2016-01-02 ENCOUNTER — Other Ambulatory Visit: Payer: Self-pay | Admitting: Neurology

## 2016-01-02 NOTE — Telephone Encounter (Signed)
I called [facility]'s [facility]. Spoke to [place]. He reports that the vimpat RX was filled on 12/31/15 for $3. Nothing is needed from our office at this time.

## 2016-01-10 ENCOUNTER — Encounter (HOSPITAL_COMMUNITY): Payer: Self-pay | Admitting: Emergency Medicine

## 2016-01-10 ENCOUNTER — Emergency Department (HOSPITAL_COMMUNITY)
Admission: EM | Admit: 2016-01-10 | Discharge: 2016-01-10 | Disposition: A | Discharge status: Home / Self Care | Payer: Medicaid Other | Attending: Emergency Medicine | Admitting: Emergency Medicine

## 2016-01-10 DIAGNOSIS — K625 Hemorrhage of anus and rectum: Secondary | ICD-10-CM | POA: Diagnosis present

## 2016-01-10 DIAGNOSIS — Z87891 Personal history of nicotine dependence: Secondary | ICD-10-CM | POA: Diagnosis not present

## 2016-01-10 DIAGNOSIS — I10 Essential (primary) hypertension: Secondary | ICD-10-CM | POA: Diagnosis not present

## 2016-01-10 DIAGNOSIS — K649 Unspecified hemorrhoids: Secondary | ICD-10-CM | POA: Diagnosis not present

## 2016-01-10 DIAGNOSIS — Z7982 Long term (current) use of aspirin: Secondary | ICD-10-CM | POA: Insufficient documentation

## 2016-01-10 LAB — CBC WITH DIFFERENTIAL/PLATELET
Basophils Absolute: 0 10*3/uL (ref 0.0–0.1)
Basophils Relative: 1 %
Eosinophils Absolute: 0.1 10*3/uL (ref 0.0–0.7)
Eosinophils Relative: 2 %
HEMATOCRIT: 40.7 % (ref 36.0–46.0)
Hemoglobin: 12.9 g/dL (ref 12.0–15.0)
LYMPHS ABS: 1.3 10*3/uL (ref 0.7–4.0)
Lymphocytes Relative: 31 %
MCH: 29.5 pg (ref 26.0–34.0)
MCHC: 31.7 g/dL (ref 30.0–36.0)
MCV: 93.1 fL (ref 78.0–100.0)
Monocytes Absolute: 0.4 10*3/uL (ref 0.1–1.0)
Monocytes Relative: 9 %
Neutro Abs: 2.4 10*3/uL (ref 1.7–7.7)
Neutrophils Relative %: 57 %
Platelets: 167 10*3/uL (ref 150–400)
RBC: 4.37 MIL/uL (ref 3.87–5.11)
RDW: 12.7 % (ref 11.5–15.5)
WBC: 4.2 10*3/uL (ref 4.0–10.5)

## 2016-01-10 LAB — BASIC METABOLIC PANEL
Anion gap: 7 (ref 5–15)
BUN: 26 mg/dL — AB (ref 6–20)
CHLORIDE: 112 mmol/L — AB (ref 101–111)
CO2: 26 mmol/L (ref 22–32)
Calcium: 9.2 mg/dL (ref 8.9–10.3)
Creatinine, Ser: 0.81 mg/dL (ref 0.44–1.00)
GFR calc Af Amer: >60 mL/min (ref >60)
GFR calc non Af Amer: >60 mL/min (ref >60)
GLUCOSE: 123 mg/dL — AB (ref 65–99)
POTASSIUM: 4 mmol/L (ref 3.5–5.1)
Sodium: 145 mmol/L (ref 135–145)

## 2016-01-10 LAB — POC OCCULT BLOOD, ED: FECAL OCCULT BLD: POSITIVE — AB

## 2016-01-10 MED ORDER — POLYETHYLENE GLYCOL 3350 17 GM/SCOOP PO POWD: 17.0000 g | Freq: Two times a day (BID) | ORAL | 0 refills | Status: DC

## 2016-01-10 NOTE — ED Provider Notes (Signed)
[facility] DEPT Provider Note   CSN: [id number] Arrival date & time: 01/10/16  1155     History   Chief Complaint Chief Complaint  Patient presents with  . Rectal Bleeding    HPI ABBEE Nguyen is a 65 y.o. female.  Patient presents to the emergency department with chief complaint of rectal bleeding. She states that she noticed some bright red bleeding this morning while having a firm bowel movement. She denies any dizziness, or lightheadedness. Denies any chest pain or shortness of breath. She states that she has intermittent episodes diarrhea and constipation. She denies ever seeing bright red blood until today. She denies any abdominal pain. Denies any fevers, chills, nausea, or vomiting. She has not tried anything for her symptoms. There are no other related symptoms.   The history is provided by the patient. No language interpreter was used.    Past Medical History:  Diagnosis Date  . Arthritis   . 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 Migraine   . Seizure disorder [facility])    since age 45-3  . Seizures ([place])    last sz 12/31/15  . Thyroid disease     Patient Active Problem List   Diagnosis Date Noted  . Frontal lobe epilepsy ([place]) 01/01/2014  . Complex partial epilepsy ([place]) 12/29/2013  . Localization-related (focal) (partial) epilepsy and epileptic syndromes with complex partial seizures, without mention of intractable epilepsy 05/09/2013  . Migraine 02/21/2013  . Right lower quadrant pain 02/21/2013  . Hypertension 02/21/2013  . Stress at home 02/21/2013  . Restless leg syndrome 12/28/2012  . Convulsion, non-epileptic ([place]) 12/28/2012    Past Surgical History:  Procedure Laterality Date  . BREAST LUMPECTOMY  1990  . TUBAL LIGATION      OB History    No data available       Home Medications    Prior to Admission medications   Medication Sig Start Date End Date Taking? Authorizing Provider  aspirin-acetaminophen-caffeine (EXCEDRIN MIGRAINE)  [id number] MG per tablet Take 1 tablet by mouth every 6 (six) hours as needed for migraine.    Yes Historical Provider, MD  atorvastatin (LIPITOR) 10 MG tablet Take 10 mg by mouth at bedtime.  05/04/13  Yes Historical Provider, MD  Calcium Carb-Cholecalciferol (CALCIUM + D3) 600-200 MG-UNIT TABS Take by mouth. Calcium 600 mg + Vit D, minerlas   Yes Historical Provider, MD  diazepam (VALIUM) 10 MG tablet Take 1 tablet (10 mg total) by mouth daily as needed for anxiety. 12/31/15  Yes Penni Bombard, MD  docusate sodium (COLACE) 100 MG capsule Take 100 mg by mouth at bedtime.   Yes Historical Provider, MD  lacosamide (VIMPAT) 200 MG TABS tablet Take 1 tablet (200 mg total) by mouth 2 (two) times daily. 12/31/15  Yes Penni Bombard, MD  levothyroxine (SYNTHROID, LEVOTHROID) 25 MCG tablet Take 25 mcg by mouth daily before breakfast.  05/04/13  Yes Historical Provider, MD  meloxicam (MOBIC) 7.5 MG tablet Take 7.5 mg by mouth 2 (two) times daily.    Yes Historical Provider, MD  methocarbamol (ROBAXIN) 750 MG tablet Take 750 mg by mouth 2 (two) times daily as needed. 11/30/14  Yes Historical Provider, MD  rOPINIRole (REQUIP) 1 MG tablet 1mg  in AM and 2mg  in PM 12/31/15  Yes Penni Bombard, MD  topiramate (TOPAMAX) 100 MG tablet Take 1 tablet (100 mg total) by mouth 2 (two) times daily. 12/31/15  Yes Penni Bombard, MD  vitamin E  200 UNIT capsule Take 200 Units by mouth at bedtime.    Yes Historical Provider, MD    Family History Family History  Problem Relation Age of Onset  . Stroke Father   . Heart attack Father   . Throat cancer Sister   . Seizures Sister   . Migraines Sister   . Diabetes Maternal Grandmother   . Diabetes Maternal Grandfather   . Kidney Stones Sister     Social History Social History  Substance Use Topics  . Smoking status: Former Smoker    Quit date: 04/16/1998  . Smokeless tobacco: Never Used  . Alcohol use No     Allergies   Other and Sulfa  antibiotics   Review of Systems Review of Systems  Gastrointestinal: Positive for anal bleeding.  All other systems reviewed and are negative.    Physical Exam Updated Vital Signs BP 161/89 (BP Location: Right Arm)   Pulse 71   Temp 98.1 F (36.7 C) (Oral)   Resp 22   Ht 5\' 3"  (1.6 m)   Wt 81.6 kg   SpO2 100%   BMI 31.89 kg/m   Physical Exam  Constitutional: She is oriented to person, place, and time. She appears well-developed and well-nourished.  HENT:  Head: Normocephalic and atraumatic.  Eyes: Conjunctivae and EOM are normal. Pupils are equal, round, and reactive to light.  Neck: Normal range of motion. Neck supple.  Cardiovascular: Normal rate and regular rhythm.  Exam reveals no gallop and no friction rub.   No murmur heard. Pulmonary/Chest: Effort normal and breath sounds normal. No respiratory distress. She has no wheezes. She has no rales. She exhibits no tenderness.  Abdominal: Soft. Bowel sounds are normal. She exhibits no distension and no mass. There is no tenderness. There is no rebound and no guarding.  Genitourinary:  Genitourinary Comments: Chaperone present during exam, several external hemorrhoids with mild bleeding from the largest at the 3:00 position  Musculoskeletal: Normal range of motion. She exhibits no edema or tenderness.  Neurological: She is alert and oriented to person, place, and time.  Skin: Skin is warm and dry.  Psychiatric: She has a normal mood and affect. Her behavior is normal. Judgment and thought content normal.  Nursing note and vitals reviewed.    ED Treatments / Results  Labs (all labs ordered are listed, but only abnormal results are displayed) Labs Reviewed  CBC WITH DIFFERENTIAL/PLATELET  BASIC METABOLIC PANEL  OCCULT BLOOD X 1 CARD TO LAB, STOOL    EKG  EKG Interpretation None       Radiology No results found.  Procedures Procedures (including critical care time)  Medications Ordered in ED Medications -  No data to display   Initial Impression / Assessment and Plan / ED Course  I have reviewed the triage vital signs and the nursing notes.  Pertinent labs & imaging results that were available during my care of the patient were reviewed by me and considered in my medical decision making (see chart for details).  Clinical Course    Patient with complaint of bright red blood in toilet bowl and on toilet paper today. She has been constipated. She has hemorrhoids on exam, which are the source of her bleeding. This is visualized during exam. Her H&H is stable. Vital signs are stable. She has no chest pain, shortness of breath, dizziness, or lightheadedness. She is stable and ready for discharge. She may follow-up with her primary care doctor. I will prescribe her MiraLAX.  Final  Clinical Impressions(s) / ED Diagnoses   Final diagnoses:  Rectal bleeding  Hemorrhoids, unspecified hemorrhoid type    New Prescriptions New Prescriptions   POLYETHYLENE GLYCOL POWDER (GLYCOLAX/MIRALAX) POWDER    Take 17 g by mouth 2 (two) times daily. Until daily soft stools  OTC     Montine Circle, PA-C 01/10/16 1347    Leo Grosser, MD 01/10/16 [phone]

## 2016-01-10 NOTE — ED Triage Notes (Signed)
Pt c/o rectal bleeding-- bright red approx 1/2  tablespoon in commode -- to ED via [facility]. Denies pain in rectum/hemmorhoids--

## 2016-01-10 NOTE — ED Notes (Signed)
Pt is dressed and ready to be discharged

## 2016-07-06 ENCOUNTER — Other Ambulatory Visit: Payer: Self-pay | Admitting: Diagnostic Neuroimaging

## 2016-08-03 ENCOUNTER — Other Ambulatory Visit: Payer: Self-pay | Admitting: Diagnostic Neuroimaging

## 2016-08-04 ENCOUNTER — Other Ambulatory Visit: Payer: Self-pay | Admitting: Diagnostic Neuroimaging

## 2016-08-05 NOTE — Addendum Note (Signed)
Addended byOliver Hum on: 08/05/2016 02:13 PM   Modules accepted: Orders

## 2016-08-06 ENCOUNTER — Other Ambulatory Visit: Payer: Self-pay | Admitting: Diagnostic Neuroimaging

## 2016-08-06 NOTE — Telephone Encounter (Signed)
LMVM for pt to return call concerning valium usage.

## 2016-08-06 NOTE — Telephone Encounter (Signed)
Pt called back.  She states that she uses the valium when she feels like she is going to have a sz (this could be 2 days prior to having a sz).  She states that she sometimes will take 1 tablet and if after an hour will take another tablet if she feels like the first one has not helped.  She states that she when she is stressed she has the auras.  (she mentioned 2 deaths in her family, 2 family members ill, her sister living away).  She had a sz Sunday.  She has been taking her other medications (topamax 100mg  po bid and vimpat 200mg  po bid).  You see her annually.  Next appt is 12-2016.    I can move up her appt .

## 2016-08-06 NOTE — Telephone Encounter (Signed)
Spoke to Dr. Leta Baptist,  Would like to have pt come in to discuss.  Made appt 08-25-16 at 1130.

## 2016-08-25 ENCOUNTER — Encounter (INDEPENDENT_AMBULATORY_CARE_PROVIDER_SITE_OTHER): Payer: Self-pay

## 2016-08-25 ENCOUNTER — Encounter: Payer: Self-pay | Admitting: Diagnostic Neuroimaging

## 2016-08-25 ENCOUNTER — Ambulatory Visit (INDEPENDENT_AMBULATORY_CARE_PROVIDER_SITE_OTHER): Payer: Medicaid Other | Admitting: Diagnostic Neuroimaging

## 2016-08-25 VITALS — BP 140/91 | HR 65 | Wt 179.6 lb

## 2016-08-25 DIAGNOSIS — G40802 Other epilepsy, not intractable, without status epilepticus: Secondary | ICD-10-CM | POA: Diagnosis not present

## 2016-08-25 DIAGNOSIS — G2581 Restless legs syndrome: Secondary | ICD-10-CM | POA: Diagnosis not present

## 2016-08-25 DIAGNOSIS — G40219 Localization-related (focal) (partial) symptomatic epilepsy and epileptic syndromes with complex partial seizures, intractable, without status epilepticus: Secondary | ICD-10-CM

## 2016-08-25 DIAGNOSIS — F419 Anxiety disorder, unspecified: Secondary | ICD-10-CM

## 2016-08-25 MED ORDER — ROPINIROLE HCL 1 MG PO TABS: ORAL_TABLET | ORAL | 4 refills | Status: DC

## 2016-08-25 MED ORDER — DIAZEPAM 10 MG PO TABS: 10.0000 mg | ORAL_TABLET | Freq: Every day | ORAL | 0 refills | Status: DC | PRN

## 2016-08-25 MED ORDER — TOPIRAMATE 100 MG PO TABS: 100.0000 mg | ORAL_TABLET | Freq: Two times a day (BID) | ORAL | 4 refills | Status: DC

## 2016-08-25 MED ORDER — LACOSAMIDE 200 MG PO TABS: 200.0000 mg | ORAL_TABLET | Freq: Two times a day (BID) | ORAL | 5 refills | Status: DC

## 2016-08-25 NOTE — Progress Notes (Signed)
[facility]  PATIENT: Teresa Nguyen DOB: 01-07-1951  REFERRING CLINICIAN:  HISTORY FROM: patient  REASON FOR VISIT: follow up    HISTORICAL  CHIEF COMPLAINT:  Chief Complaint  Patient presents with  . Partial symptomatic epilepsy    rm 7, "Vimpat is interferring with my words; getting worse"  . Follow-up    need med refills    HISTORY OF PRESENT ILLNESS:   UPDATE 08/25/16: Since last visit, no seizures. Had some "aura" sensations which consistent of "pulse" in the head sensation, esp with stress. Tolerating meds.   UPDATE 12/31/15: Last night forgot vimpat dose; this morning at 4am, woke up, had to urinate, stood up and then fell back on to bed, may have had brief LOC. Thinks she had a seizure. No post-ictal confusion.  No tongue biting. Restless legs stable with medication.   UPDATE 12/31/14: Since last visit, doing well, no seizures. Used valium 10mg  x 1 time in the last year for a seizure aura. Tolerating vimpat 200mg  BID + TPX 100mg  BID. Went to ER in Feb for syncope (dx with dehydration).   UPDATE 12/29/13 (LL): Since last visit, went to [facility] to have EMU monitoring. EMU report --> June 15-18, 2015: "Long-term video EEG monitoring captured multiple events; including electrographic frontal hypermotor seizures. She also exhibited episodes of oral dyskinetic activity which is not seizure (no EEG correlate)." It was concluded that her typical arousal events are consistent with frontal lobe epilepsy. Home medications Vimpat 200 mg twice a day,Topirimate 100 mg twice a day were resumed, but they recommended reducing home dose of Valium as she was taking 10-20 mg when necessary which likely to affects her affect and impairs her communication. She has had several close family members who have died in the last year, including her mother and her sister who died suddenly. Patient is very content on her current medication regimen, stating that she has felt better on Vimpat  than ever before. She has not had many nocturnal seizures since starting Vimpat, and states that she was afraid to go to sleep prior to starting it. Her past AEDs have been phenobarbital, phenytoin, Lacosamide, lamotrigine, levetiracetam, Topirimate, valproic acid and zonisamide. Known seizure triggers are eating broccoli or eggplant. RLS symptoms are controlled on Ropinirole, although some nights she must take an extra 0.25 of a tablet to be comfortable.  UPDATE 12/28/12: Outside neurologist records received and reviewed. 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 Since last visit, no more seizures or spells. Her RLS is getting worse, b/c she is running out of ropinirole and was reducing the dose. Still with high stress. Not interested in seeing psychiatry.   UPDATE 06/10/12: Since last visit, sz stopped, until Dec 2013. She was preparing for colonoscopy, and anticipated having diarrhea from bowel prep. Before she took the bowel prep meds, she started with diarrhea. Soon after she had a seizure (no LOC, eye up, head turned, mouth twitching; no generalized convulsions. she could hear and move, but not talk). She took a valium, and her spell stopped. I still haven't received records from prior neurologist. Patient tells me that she was dx'd with "partial non-epileptic seizures". She was told to take valium prn to stop her seizures at home, in order to avoid freq ER trips.   PRIOR HPI (12/04/11): 66 year old right-handed female here for evaluation of seizure disorder. At age 66 years old, patient fell down a flight of stairs. Soon after she began to have "petit  mall seizures". Patient states she was having inability to speak, convulsions and tongue biting. She started on Dilantin and phenobarbital early in life. Over the years she tried "many different seizure medications". She was living in [place] for most of her life and followed by neurology there. She had extensive testing  with EEG, video EEG, sleep study, MRI, without any significant abnormalities. Unclear if any of these seizures were captured during EEG recordings. It sounds like no epileptiform discharges were ever noted. Unfortunately I do not have any of her prior records to review myself for this visit. Patient has been on Vimpat and topiramate over the past year, and now has one episode every 3 weeks of "seizure". Current episodes consist of gagging sensation, inability to speak, without loss of consciousness tongue biting or convulsions.    REVIEW OF SYSTEMS: Full 14 system review of systems performed and negative except: fatigue seizure d/o speech diff rectal bleeding joint pain heat intolerance eye discharge drooling.      ALLERGIES: Allergies  Allergen Reactions  . Other Diarrhea    Nuts and dark green vegetables cause diarrhea and dehydration  . Sulfa Antibiotics Rash    Red all over, itching    HOME MEDICATIONS: Outpatient Medications Prior to Visit  Medication Sig Dispense Refill  . aspirin-acetaminophen-caffeine (EXCEDRIN MIGRAINE) 250-250-65 MG per tablet Take 1 tablet by mouth every 6 (six) hours as needed for migraine.     Marland Kitchen atorvastatin (LIPITOR) 10 MG tablet Take 10 mg by mouth at bedtime.     . Calcium Carb-Cholecalciferol (CALCIUM + D3) 600-200 MG-UNIT TABS Take by mouth. Calcium 600 mg + Vit D, minerlas    . diazepam (VALIUM) 10 MG tablet TAKE 1 TABLET BY MOUTH EVERY DAY AS NEEDED FOR ANXIETY 30 tablet 4  . docusate sodium (COLACE) 100 MG capsule Take 100 mg by mouth at bedtime.    Marland Kitchen levothyroxine (SYNTHROID, LEVOTHROID) 25 MCG tablet Take 25 mcg by mouth daily before breakfast.     . meloxicam (MOBIC) 7.5 MG tablet Take 7.5 mg by mouth 2 (two) times daily.     . methocarbamol (ROBAXIN) 750 MG tablet Take 750 mg by mouth 2 (two) times daily as needed.  2  . polyethylene glycol powder (GLYCOLAX/MIRALAX) powder Take 17 g by mouth 2 (two) times daily. Until daily soft stools  OTC 225  g 0  . rOPINIRole (REQUIP) 1 MG tablet 1mg  in AM and 2mg  in PM 90 tablet 12  . topiramate (TOPAMAX) 100 MG tablet Take 1 tablet (100 mg total) by mouth 2 (two) times daily. 180 tablet 4  . VIMPAT 200 MG TABS tablet TAKE 1 TABLET BY MOUTH TWICE A DAY 60 tablet 5  . vitamin E 200 UNIT capsule Take 200 Units by mouth at bedtime.      No facility-administered medications prior to visit.     PAST MEDICAL HISTORY: Past Medical History:  Diagnosis Date  . Arthritis   . 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 Migraine   . Seizure disorder [facility])    since age 3-3  . Seizures ([place])    last sz 12/31/15  . Thyroid disease     PAST SURGICAL HISTORY: Past Surgical History:  Procedure Laterality Date  . BREAST LUMPECTOMY  1990  . TUBAL LIGATION      FAMILY HISTORY: Family History  Problem Relation Age of Onset  . Stroke Father   . Heart attack Father   . Throat cancer Sister   .  Seizures Sister   . Migraines Sister   . Diabetes Maternal Grandmother   . Diabetes Maternal Grandfather   . Kidney Stones Sister     SOCIAL HISTORY:  Social History   Social History  . Marital status: Divorced    Spouse name: N/A  . Number of children: 2  . Years of education: 12th   Occupational History  . disabled    Social History Main Topics  . Smoking status: Former Smoker    Quit date: 04/16/1998  . Smokeless tobacco: Never Used  . Alcohol use No  . Drug use: No  . Sexual activity: Not Currently   Other Topics Concern  . Not on file   Social History Narrative   Patient lives at home alone.   Caffeine Use: 1 cup of coffee daily     PHYSICAL EXAM  GENERAL EXAM/CONSTITUTIONAL: Vitals:  Vitals:   08/25/16 1126  BP: (!) 140/91  Pulse: 65  Weight: 179 lb 9.6 oz (81.5 kg)   Body mass index is 31.81 kg/m. No exam data present  Patient is in no distress; well developed, nourished and groomed; neck is supple  CARDIOVASCULAR:  Examination of carotid arteries is normal; no carotid  bruits  Regular rate and rhythm, no murmurs  Examination of peripheral vascular system by observation and palpation is normal  EYES:  Ophthalmoscopic exam of optic discs and posterior segments is normal; no papilledema or hemorrhages  MUSCULOSKELETAL:  Gait, strength, tone, movements noted in Neurologic exam below  NEUROLOGIC: MENTAL STATUS:  No flowsheet data found.  awake, alert, oriented to person, place and time  recent and remote memory intact  normal attention and concentration  language fluent, comprehension intact, naming intact,   fund of knowledge appropriate  CRANIAL NERVE:   2nd - no papilledema on fundoscopic exam  2nd, 3rd, 4th, 6th - pupils equal and reactive to light, visual fields full to confrontation, extraocular muscles intact, no nystagmus  5th - facial sensation symmetric  7th - facial strength symmetric  8th - hearing intact  9th - palate elevates symmetrically, uvula midline  11th - shoulder shrug symmetric  12th - tongue protrusion midline  MOTOR:   normal bulk and tone, full strength in the BUE, BLE  SENSORY:   normal and symmetric to light touch, temperature, vibration  COORDINATION:   finger-nose-finger, fine finger movements normal  REFLEXES:   deep tendon reflexes present and symmetric  GAIT/STATION:   narrow based gait     DIAGNOSTIC DATA (LABS, IMAGING, TESTING) - I reviewed patient records, labs, notes, testing and imaging myself where available.  Lab Results  Component Value Date   WBC 4.2 01/10/2016   HGB 12.9 01/10/2016   HCT 40.7 01/10/2016   MCV 93.1 01/10/2016   PLT 167 01/10/2016      Component Value Date/Time   NA 145 01/10/2016 1225   K 4.0 01/10/2016 1225   CL 112 (H) 01/10/2016 1225   CO2 26 01/10/2016 1225   GLUCOSE 123 (H) 01/10/2016 1225   BUN 26 (H) 01/10/2016 1225   CREATININE 0.81 01/10/2016 1225   CALCIUM 9.2 01/10/2016 1225   GFRNONAA >60 01/10/2016 1225   GFRAA >60  01/10/2016 1225   No results found for: CHOL, HDL, LDLCALC, LDLDIRECT, TRIG, CHOLHDL No results found for: HGBA1C No results found for: VITAMINB12 No results found for: TSH  06/03/14 CT head [I reviewed images myself and agree with interpretation. -VRP]  - normal   05/24/13 MRI brain [I reviewed images  myself and agree with interpretation. -[facility]P]  1. No acute intracranial abnormality. 2. Volume loss and signal changes in the left mesial temporal lobe compatible with mesial temporal sclerosis. 3. Otherwise negative MRI appearance of the brain.    ASSESSMENT AND PLAN  66 y.o. year old female here with history of seizure disorder since age 48-46 years old, diagnosed at [facility] in June 2015 with frontal lobe epilepsy through VEEG. Last seizure on June 2015 (during [facility] video EEG). Also with atypical event on 12/31/15 (syncope vs seizure).   Dx:  Anxiety - Plan: Ambulatory referral to Psychiatry  Partial symptomatic epilepsy with complex partial seizures, intractable, without status epilepticus ([place])  Restless leg syndrome  Frontal lobe epilepsy ([place])    PLAN:  I spent 25 minutes of face to face time with patient. Greater than 50% of time was spent in counseling and coordination of care with patient. In summary we discussed:  - Continue vimpat 200mg  BID - Continue topiramate 100mg  BID - Continue diazepam 10mg  prn for seizure aura (avg 2 tabs every 6 weeks) for now; medication may also be helping underlying stress/anxiety issues; will consult psychiatry/psychology for add'l input - Continue ropinirole (1mg  in AM, 2mg  in PM) for RLS  Meds ordered this encounter  Medications  . lacosamide (VIMPAT) 200 MG TABS tablet    Sig: Take 1 tablet (200 mg total) by mouth 2 (two) times daily.    Dispense:  60 tablet    Refill:  5    This request is for a new prescription for a controlled substance as required by Federal/State law.  . topiramate (TOPAMAX) 100 MG tablet     Sig: Take 1 tablet (100 mg total) by mouth 2 (two) times daily.    Dispense:  180 tablet    Refill:  4  . rOPINIRole (REQUIP) 1 MG tablet    Sig: 1mg  in AM and 2mg  in PM    Dispense:  270 tablet    Refill:  4  . diazepam (VALIUM) 10 MG tablet    Sig: Take 1 tablet (10 mg total) by mouth daily as needed for anxiety.    Dispense:  30 tablet    Refill:  0    This request is for a new prescription for a controlled substance as required by Federal/State law.   Return in about 6 months (around 02/25/2017).    Penni Bombard, MD 1/44/3154, 00:86 PM Certified in Neurology, Neurophysiology and [facility] [address] [place], [place] [address] [phone]

## 2016-11-10 ENCOUNTER — Telehealth: Payer: Self-pay | Admitting: *Deleted

## 2016-11-10 NOTE — Telephone Encounter (Signed)
Spoke with patient to reschedule her 6 month follow up due to dr being on vacation.  Advised her that she also has a follow up in Sept that was made Sept 2017. However she saw Dr Leta Baptist in May 2018, and he scheduled her for FU in 6 months.  Patient stated she does not need FU in Sept, requested Nov FU be moved. Rescheduled her for 03/14/17, advised she arrive 30 min early. She verbalized understanding, appreciation of call.

## 2016-11-30 ENCOUNTER — Telehealth: Payer: Self-pay | Admitting: Diagnostic Neuroimaging

## 2016-11-30 NOTE — Telephone Encounter (Signed)
Patient called office in reference to referral for [facility].  Patient said she has been trying to get in touch with them, but keeps getting transferred and can't get an appointment.  Please call

## 2016-11-30 NOTE — Telephone Encounter (Signed)
Pt returned RN's call. Message relayed, pt was appreciative.

## 2016-11-30 NOTE — Telephone Encounter (Signed)
Called [facility] to inquire about phone calls getting through. Staff stated the patient should have no issues, call the number , press option 2. This RN LVM for patient advising her of above. Suggested she continue to call until she gets through.

## 2016-12-30 ENCOUNTER — Ambulatory Visit: Payer: Medicaid Other | Admitting: Diagnostic Neuroimaging

## 2017-01-05 ENCOUNTER — Other Ambulatory Visit: Payer: Self-pay | Admitting: Diagnostic Neuroimaging

## 2017-01-06 NOTE — Telephone Encounter (Signed)
Fax confirmation recevied [facility] [phone]. sy

## 2017-01-28 ENCOUNTER — Other Ambulatory Visit: Payer: Self-pay | Admitting: Diagnostic Neuroimaging

## 2017-01-30 ENCOUNTER — Other Ambulatory Visit: Payer: Self-pay | Admitting: Diagnostic Neuroimaging

## 2017-02-26 ENCOUNTER — Other Ambulatory Visit: Payer: Self-pay | Admitting: Diagnostic Neuroimaging

## 2017-02-26 NOTE — Telephone Encounter (Signed)
Verbal to Ebony Hail, pharmacist who did not have faxed copy from 01-06-17.   VIMPAT 200mg  po BID #[id number] RF.

## 2017-03-01 ENCOUNTER — Ambulatory Visit: Payer: Medicaid Other | Admitting: Diagnostic Neuroimaging

## 2017-03-05 ENCOUNTER — Other Ambulatory Visit: Payer: Self-pay | Admitting: Diagnostic Neuroimaging

## 2017-03-11 IMAGING — MG MM SCREENING BREAST TOMO BILATERAL
8 series · 8 of 24 positions shown · non-contrast
Comparison: Previous exam(s).

CLINICAL DATA: Screening.

EXAM:
DIGITAL SCREENING BILATERAL MAMMOGRAM WITH 3D TOMO WITH CAD

[R CC]
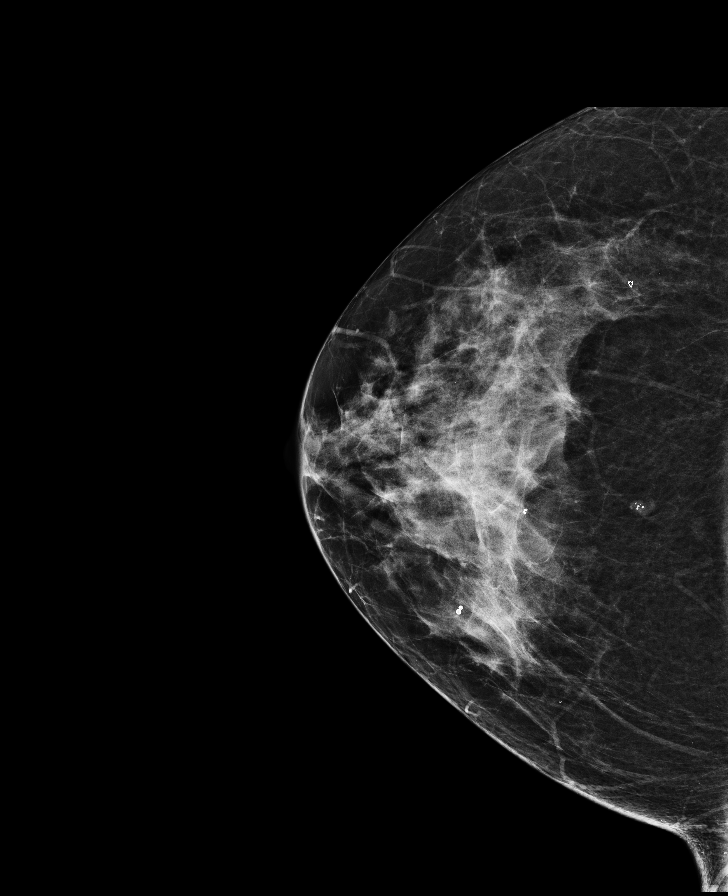

[L MLO]
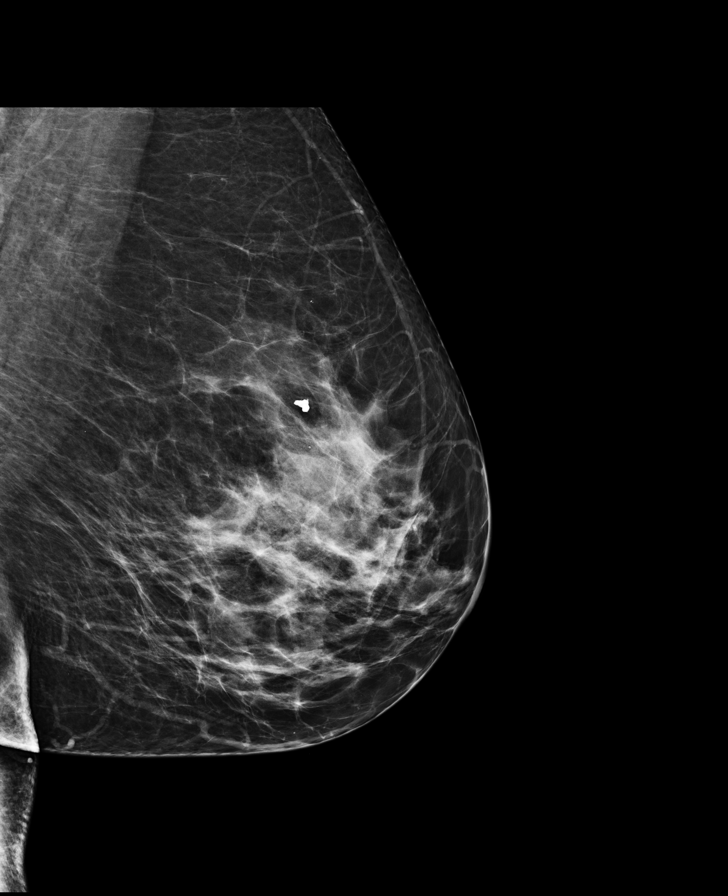

[R MLO]
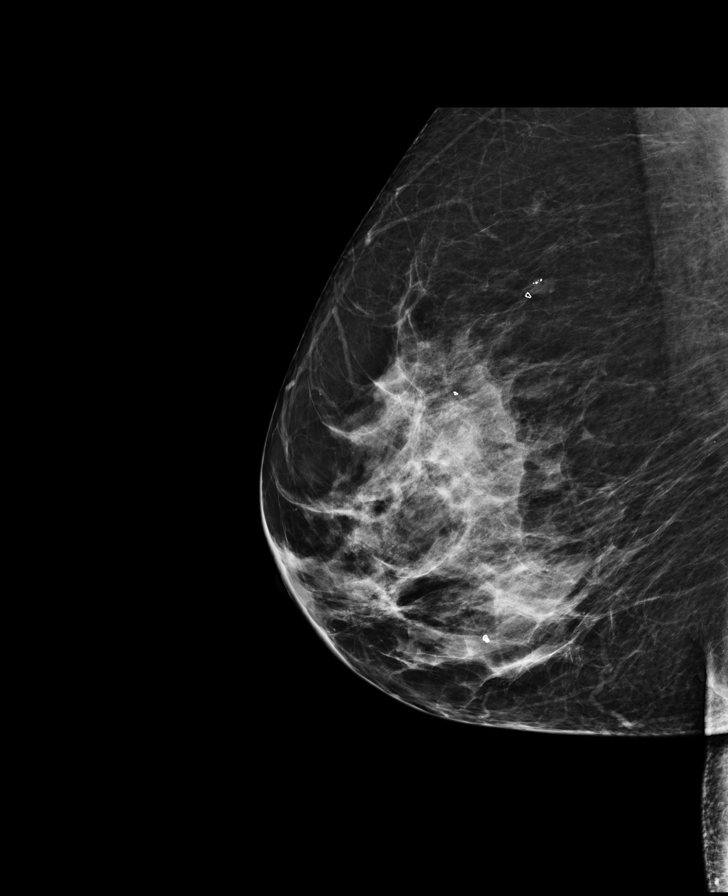

[L CC]
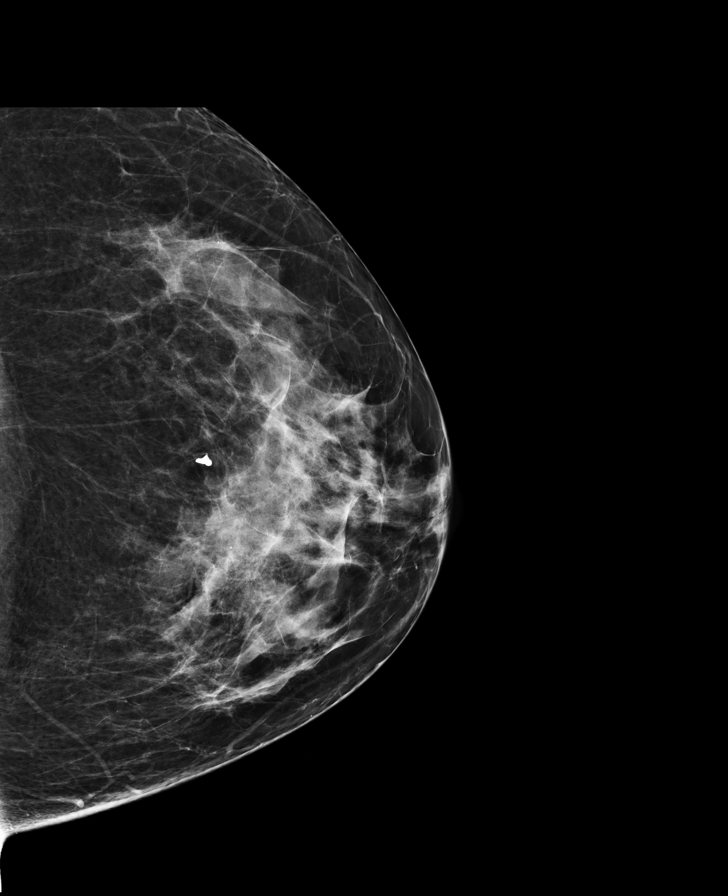

[R CC tomo · tomo slice 34/67.0]
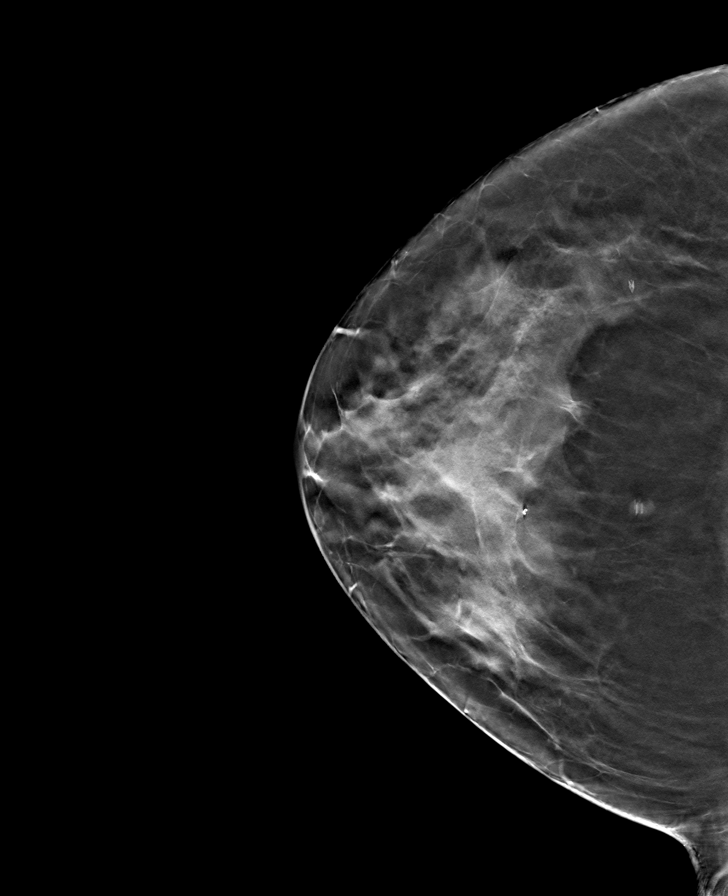

[L CC tomo · tomo slice 35/68.0]
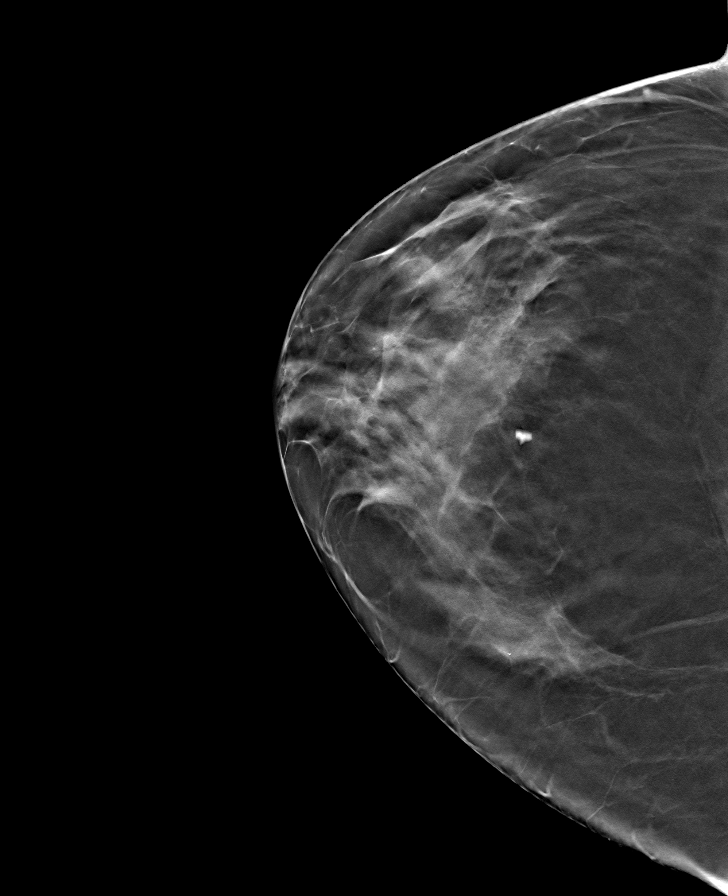

[R MLO tomo · tomo slice 36/71.0]
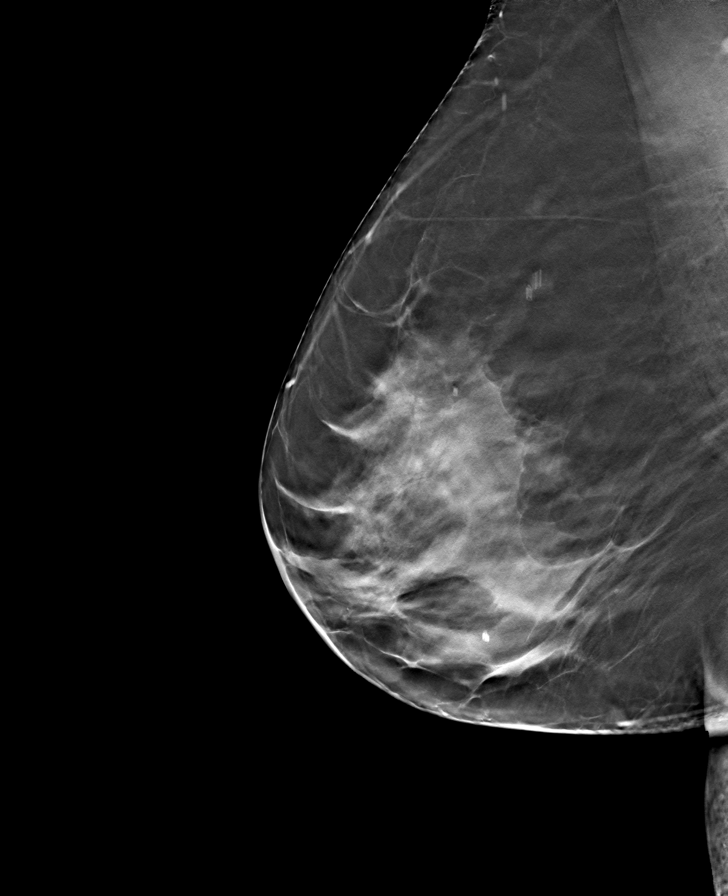

[L MLO tomo · tomo slice 38/75.0]
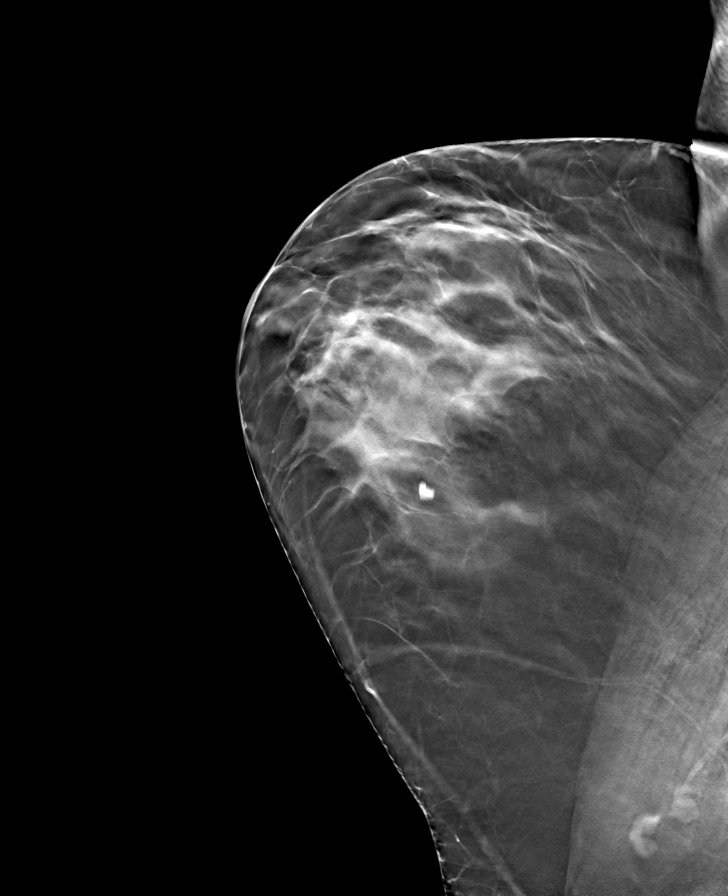

[8 of 24 positions shown; findings below may reference images not displayed]

ACR Breast Density Category c: The breast tissue is heterogeneously
dense, which may obscure small masses.
FINDINGS: There are no findings suspicious for malignancy. Images were
processed with CAD.
IMPRESSION: No mammographic evidence of malignancy. A result letter of this
screening mammogram will be mailed directly to the patient.

RECOMMENDATION:
Screening mammogram in one year. (Code:OA-G-1SS)

BI-RADS CATEGORY  1: Negative.

## 2017-03-17 ENCOUNTER — Ambulatory Visit: Payer: Medicaid Other | Admitting: Diagnostic Neuroimaging

## 2017-03-17 ENCOUNTER — Encounter: Payer: Self-pay | Admitting: Diagnostic Neuroimaging

## 2017-03-17 ENCOUNTER — Encounter (INDEPENDENT_AMBULATORY_CARE_PROVIDER_SITE_OTHER): Payer: Self-pay

## 2017-03-17 VITALS — BP 156/92 | HR 71 | Wt 182.4 lb

## 2017-03-17 DIAGNOSIS — G2581 Restless legs syndrome: Secondary | ICD-10-CM

## 2017-03-17 DIAGNOSIS — F419 Anxiety disorder, unspecified: Secondary | ICD-10-CM | POA: Diagnosis not present

## 2017-03-17 DIAGNOSIS — G40219 Localization-related (focal) (partial) symptomatic epilepsy and epileptic syndromes with complex partial seizures, intractable, without status epilepticus: Secondary | ICD-10-CM

## 2017-03-17 MED ORDER — TOPIRAMATE 100 MG PO TABS: 100.0000 mg | ORAL_TABLET | Freq: Two times a day (BID) | ORAL | 4 refills | Status: DC

## 2017-03-17 MED ORDER — LACOSAMIDE 200 MG PO TABS: 200.0000 mg | ORAL_TABLET | Freq: Two times a day (BID) | ORAL | 5 refills | Status: DC

## 2017-03-17 MED ORDER — ROPINIROLE HCL 1 MG PO TABS: ORAL_TABLET | ORAL | 4 refills | Status: DC

## 2017-03-17 NOTE — Progress Notes (Signed)
[facility]  PATIENT: Teresa Nguyen DOB: Jul 22, 1950  REFERRING CLINICIAN:  HISTORY FROM: patient  REASON FOR VISIT: follow up    HISTORICAL  CHIEF COMPLAINT:  Chief Complaint  Patient presents with  . Epilepsy    rm 7, "one episode of anxiety, took Diazepam and it took care of it"  . Follow-up    6 month    HISTORY OF PRESENT ILLNESS:   UPDATE (03/17/17, VRP): Since last visit, doing well. Tolerating meds. No alleviating or aggravating factors. No seizures. Has seen Dr. Noemi Chapel (psychiatry) and was started on Equetro (carbamazepine ER samples).   UPDATE 08/25/16: Since last visit, no seizures. Had some "aura" sensations which consistent of "pulse" in the head sensation, esp with stress. Tolerating meds.   UPDATE 12/31/15: Last night forgot vimpat dose; this morning at 4am, woke up, had to urinate, stood up and then fell back on to bed, may have had brief LOC. Thinks she had a seizure. No post-ictal confusion.  No tongue biting. Restless legs stable with medication.   UPDATE 12/31/14: Since last visit, doing well, no seizures. Used valium 10mg  x 1 time in the last year for a seizure aura. Tolerating vimpat 200mg  BID + TPX 100mg  BID. Went to ER in Feb for syncope (dx with dehydration).   UPDATE 12/29/13 (LL): Since last visit, went to [facility] to have EMU monitoring. EMU report --> June 15-18, 2015: "Long-term video EEG monitoring captured multiple events; including electrographic frontal hypermotor seizures. She also exhibited episodes of oral dyskinetic activity which is not seizure (no EEG correlate)." It was concluded that her typical arousal events are consistent with frontal lobe epilepsy. Home medications Vimpat 200 mg twice a day,Topirimate 100 mg twice a day were resumed, but they recommended reducing home dose of Valium as she was taking 10-20 mg when necessary which likely to affects her affect and impairs her communication. She has had several close family  members who have died in the last year, including her mother and her sister who died suddenly. Patient is very content on her current medication regimen, stating that she has felt better on Vimpat than ever before. She has not had many nocturnal seizures since starting Vimpat, and states that she was afraid to go to sleep prior to starting it. Her past AEDs have been phenobarbital, phenytoin, Lacosamide, lamotrigine, levetiracetam, Topirimate, valproic acid and zonisamide. Known seizure triggers are eating broccoli or eggplant. RLS symptoms are controlled on Ropinirole, although some nights she must take an extra 0.25 of a tablet to be comfortable.  UPDATE 12/28/12: Outside neurologist records received and reviewed. 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 Since last visit, no more seizures or spells. Her RLS is getting worse, b/c she is running out of ropinirole and was reducing the dose. Still with high stress. Not interested in seeing psychiatry.   UPDATE 06/10/12: Since last visit, sz stopped, until Dec 2013. She was preparing for colonoscopy, and anticipated having diarrhea from bowel prep. Before she took the bowel prep meds, she started with diarrhea. Soon after she had a seizure (no LOC, eye up, head turned, mouth twitching; no generalized convulsions. she could hear and move, but not talk). She took a valium, and her spell stopped. I still haven't received records from prior neurologist. Patient tells me that she was dx'd with "partial non-epileptic seizures". She was told to take valium prn to stop her seizures at home, in order to avoid freq ER  trips.   PRIOR HPI (12/04/11): 66 year old right-handed female here for evaluation of seizure disorder. At age 35 years old, patient fell down a flight of stairs. Soon after she began to have "petit mall seizures". Patient states she was having inability to speak, convulsions and tongue biting. She started on Dilantin and  phenobarbital early in life. Over the years she tried "many different seizure medications". She was living in [place] for most of her life and followed by neurology there. She had extensive testing with EEG, video EEG, sleep study, MRI, without any significant abnormalities. Unclear if any of these seizures were captured during EEG recordings. It sounds like no epileptiform discharges were ever noted. Unfortunately I do not have any of her prior records to review myself for this visit. Patient has been on Vimpat and topiramate over the past year, and now has one episode every 3 weeks of "seizure". Current episodes consist of gagging sensation, inability to speak, without loss of consciousness tongue biting or convulsions.    REVIEW OF SYSTEMS: Full 14 system review of systems performed and negative except: ringing in ears heat intolerance.      ALLERGIES: Allergies  Allergen Reactions  . Peanut Oil Diarrhea    Nuts in general   . Other Diarrhea    Nuts and dark green vegetables cause diarrhea and dehydration  . Sulfa Antibiotics Rash    Red all over, itching    HOME MEDICATIONS: Outpatient Medications Prior to Visit  Medication Sig Dispense Refill  . aspirin-acetaminophen-caffeine (EXCEDRIN MIGRAINE) 250-250-65 MG per tablet Take 1 tablet by mouth every 6 (six) hours as needed for migraine.     Marland Kitchen atorvastatin (LIPITOR) 10 MG tablet Take 10 mg by mouth at bedtime.     . Calcium Carb-Cholecalciferol (CALCIUM + D3) 600-200 MG-UNIT TABS Take by mouth. Calcium 600 mg + Vit D, minerlas    . Carbamazepine (EQUETRO) 100 MG CP12 12 hr capsule Take by mouth.    . diazepam (VALIUM) 10 MG tablet Take 1 tablet (10 mg total) by mouth daily as needed for anxiety. 30 tablet 0  . docusate sodium (COLACE) 100 MG capsule Take 100 mg by mouth at bedtime.    Marland Kitchen ipratropium (ATROVENT) 0.06 % nasal spray USE 2 SPRAYS IN EACH NOSTRIL 4 TIMES A DAY    . levothyroxine (SYNTHROID, LEVOTHROID) 25 MCG tablet Take 25  mcg by mouth daily before breakfast.     . meloxicam (MOBIC) 7.5 MG tablet Take 7.5 mg by mouth 2 (two) times daily.     . methocarbamol (ROBAXIN) 750 MG tablet Take 750 mg by mouth 2 (two) times daily as needed.  2  . polyethylene glycol powder (GLYCOLAX/MIRALAX) powder Take 17 g by mouth 2 (two) times daily. Until daily soft stools  OTC 225 g 0  . rOPINIRole (REQUIP) 1 MG tablet 1mg  in AM and 2mg  in PM 270 tablet 4  . topiramate (TOPAMAX) 100 MG tablet Take 1 tablet (100 mg total) by mouth 2 (two) times daily. 180 tablet 4  . VIMPAT 200 MG TABS tablet TAKE 1 TABLET BY MOUTH TWICE A DAY 60 tablet 5  . vitamin E 200 UNIT capsule Take 200 Units by mouth at bedtime.      No facility-administered medications prior to visit.     PAST MEDICAL HISTORY: Past Medical History:  Diagnosis Date  . Arthritis   . 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 Migraine   . Seizure disorder ([place])  since age 15-3  . Seizures ([place])    last sz 12/31/15  . Thyroid disease     PAST SURGICAL HISTORY: Past Surgical History:  Procedure Laterality Date  . BREAST LUMPECTOMY  1990  . TUBAL LIGATION      FAMILY HISTORY: Family History  Problem Relation Age of Onset  . Stroke Father   . Heart attack Father   . Throat cancer Sister   . Seizures Sister   . Migraines Sister   . Diabetes Maternal Grandmother   . Diabetes Maternal Grandfather   . Kidney Stones Sister     SOCIAL HISTORY:  Social History   Socioeconomic History  . Marital status: Divorced    Spouse name: Not on file  . Number of children: 2  . Years of education: 12th  . Highest education level: Not on file  Social Needs  . Financial resource strain: Not on file  . Food insecurity - worry: Not on file  . Food insecurity - inability: Not on file  . Transportation needs - medical: Not on file  . Transportation needs - non-medical: Not on file  Occupational History  . Occupation: disabled  Tobacco Use  . Smoking status: Former Smoker     Last attempt to quit: 04/16/1998    Years since quitting: 18.9  . Smokeless tobacco: Never Used  Substance and Sexual Activity  . Alcohol use: No  . Drug use: No  . Sexual activity: Not Currently  Other Topics Concern  . Not on file  Social History Narrative   Patient lives at home alone.   Caffeine Use: 1 cup of coffee daily     PHYSICAL EXAM  GENERAL EXAM/CONSTITUTIONAL: Vitals:  Vitals:   03/17/17 1513  BP: (!) 156/92  Pulse: 71  Weight: 182 lb 6.4 oz (82.7 kg)   Body mass index is 32.31 kg/m. No exam data present  Patient is in no distress; well developed, nourished and groomed; neck is supple  CARDIOVASCULAR:  Examination of carotid arteries is normal; no carotid bruits  Regular rate and rhythm, no murmurs  Examination of peripheral vascular system by observation and palpation is normal  EYES:  Ophthalmoscopic exam of optic discs and posterior segments is normal; no papilledema or hemorrhages  MUSCULOSKELETAL:  Gait, strength, tone, movements noted in Neurologic exam below  NEUROLOGIC: MENTAL STATUS:  No flowsheet data found.  awake, alert, oriented to person, place and time  recent and remote memory intact  normal attention and concentration  language fluent, comprehension intact, naming intact,   fund of knowledge appropriate  CRANIAL NERVE:   2nd - no papilledema on fundoscopic exam  2nd, 3rd, 4th, 6th - pupils equal and reactive to light, visual fields full to confrontation, extraocular muscles intact, no nystagmus  5th - facial sensation symmetric  7th - facial strength symmetric  8th - hearing intact  9th - palate elevates symmetrically, uvula midline  11th - shoulder shrug symmetric  12th - tongue protrusion midline  MOTOR:   normal bulk and tone, full strength in the BUE, BLE  SENSORY:   normal and symmetric to light touch, temperature, vibration  COORDINATION:   finger-nose-finger, fine finger movements  normal  REFLEXES:   deep tendon reflexes present and symmetric  GAIT/STATION:   narrow based gait    DIAGNOSTIC DATA (LABS, IMAGING, TESTING) - I reviewed patient records, labs, notes, testing and imaging myself where available.  Lab Results  Component Value Date   WBC 4.2 01/10/2016   HGB  12.9 01/10/2016   HCT 40.7 01/10/2016   MCV 93.1 01/10/2016   PLT 167 01/10/2016      Component Value Date/Time   NA 145 01/10/2016 1225   K 4.0 01/10/2016 1225   CL 112 (H) 01/10/2016 1225   CO2 26 01/10/2016 1225   GLUCOSE 123 (H) 01/10/2016 1225   BUN 26 (H) 01/10/2016 1225   CREATININE 0.81 01/10/2016 1225   CALCIUM 9.2 01/10/2016 1225   GFRNONAA >60 01/10/2016 1225   GFRAA >60 01/10/2016 1225   No results found for: CHOL, HDL, LDLCALC, LDLDIRECT, TRIG, CHOLHDL No results found for: HGBA1C No results found for: VITAMINB12 No results found for: TSH  06/03/14 CT head [I reviewed images myself and agree with interpretation. -VRP]  - normal   05/24/13 MRI brain [I reviewed images myself and agree with interpretation. -VRP]  1. No acute intracranial abnormality. 2. Volume loss and signal changes in the left mesial temporal lobe compatible with mesial temporal sclerosis. 3. Otherwise negative MRI appearance of the brain.    ASSESSMENT AND PLAN  66 y.o. year old female here with history of seizure disorder since age 45-39 years old, diagnosed at [facility] in June 2015 with frontal lobe epilepsy through VEEG. Last seizure on June 2015 (during [facility] video EEG). Also with atypical event on 12/31/15 (syncope vs seizure). Also with some intermixed non-epileptic spells. Also with stress / anxiety issues.    Dx:  No diagnosis found.    PLAN:   SEIZURE DISORDER (frontal lobe localization) + non-epileptics spells (oral dyskinetic activity) - Continue vimpat 200mg  twice a day  - Continue topiramate 100mg  twice a day   RESTLESS LEG SYNDROME - Continue ropinirole (1mg   in AM, 2mg  in PM) for RLS  ANXIETY - follow up with psychiatry / psychology for stress/anxiety issues - no more diazepam refills for here; this was started by outside ERs in [place] and continued by her and I continued as courtesy for patient until she could get established with psychiatry for better treatment of anxiety  Meds ordered this encounter  Medications  . lacosamide (VIMPAT) 200 MG TABS tablet    Sig: Take 1 tablet (200 mg total) by mouth 2 (two) times daily.    Dispense:  60 tablet    Refill:  5  . topiramate (TOPAMAX) 100 MG tablet    Sig: Take 1 tablet (100 mg total) by mouth 2 (two) times daily.    Dispense:  180 tablet    Refill:  4  . rOPINIRole (REQUIP) 1 MG tablet    Sig: 1mg  in AM and 2mg  in PM    Dispense:  270 tablet    Refill:  4   Return in about 9 months (around 12/16/2017).    Penni Bombard, MD 16/04/958, 4:54 PM Certified in Neurology, Neurophysiology and Neuroimaging  [facility] [address], [place], [place] [address] [phone]

## 2017-03-17 NOTE — Progress Notes (Signed)
Vimpat refill Rx successfully faxed to [facility], [address]

## 2017-09-23 ENCOUNTER — Other Ambulatory Visit: Payer: Self-pay | Admitting: Internal Medicine

## 2017-09-23 DIAGNOSIS — Z1231 Encounter for screening mammogram for malignant neoplasm of breast: Secondary | ICD-10-CM

## 2017-09-23 DIAGNOSIS — E2839 Other primary ovarian failure: Secondary | ICD-10-CM

## 2017-10-02 ENCOUNTER — Other Ambulatory Visit: Payer: Self-pay | Admitting: Diagnostic Neuroimaging

## 2017-10-07 ENCOUNTER — Telehealth: Payer: Self-pay | Admitting: Diagnostic Neuroimaging

## 2017-10-07 NOTE — Telephone Encounter (Signed)
Spoke with patient and advised her Dr Leta Baptist wants to see her in 1-2 weeks to evaluate her recent seizures. scheduled for next Mon, advised she arrive 30 minutes early to check in, bring any new medications. She verbalized understanding, appreciation.

## 2017-10-07 NOTE — Telephone Encounter (Signed)
Pt's had seizures 3 nights in a row. She said they are different from the ones she's had in the past. She is wanting to be seen asap. Please call

## 2017-10-07 NOTE — Telephone Encounter (Signed)
Called patient who stated she had 3 seizures last week 3 nights in a row. She stated the seizures were different from previous seizures. She stated her chest would heave, then jerk then stop. Then it would do it again with no other issues associated with seizures.  The first night she stated it lasted 1 1/2 hours. The 2nd and 3rd nights it wasn't as long but she took Valium each night which "made it stop". She cannot recall the nights they occurred. She reported that she has not been eating well, taking meds to pass stool, then meds to stop diarrhea.  She routinely takes Miralax 1/2 dose every morning and Imodium 1/2-1 tab before going to a restaurant or eating a lot of fiber. She stated her father had history of diverticulitis which resulted in colon surgery. This RN advised she have her PCP refer her to GI to be evaluated. She agreed. She is asking if Dr Leta Baptist needs to see her for follow up due to the seizures last week or if he has any advice.  This RN stated will discuss with Dr Leta Baptist and call her back.  She verbalized understanding, appreciation of call.

## 2017-10-07 NOTE — Telephone Encounter (Signed)
Setup follow up appt in next 1-2 weeks. Could be related to GI issue and decreased absorption of meds. -VRP

## 2017-10-11 ENCOUNTER — Ambulatory Visit: Payer: Medicaid Other | Admitting: Diagnostic Neuroimaging

## 2017-10-11 ENCOUNTER — Encounter: Payer: Self-pay | Admitting: Diagnostic Neuroimaging

## 2017-10-11 VITALS — BP 112/86 | HR 76 | Ht 63.0 in | Wt 182.2 lb

## 2017-10-11 DIAGNOSIS — G40219 Localization-related (focal) (partial) symptomatic epilepsy and epileptic syndromes with complex partial seizures, intractable, without status epilepticus: Secondary | ICD-10-CM | POA: Diagnosis not present

## 2017-10-11 DIAGNOSIS — F419 Anxiety disorder, unspecified: Secondary | ICD-10-CM | POA: Diagnosis not present

## 2017-10-11 DIAGNOSIS — G2581 Restless legs syndrome: Secondary | ICD-10-CM

## 2017-10-11 MED ORDER — LACOSAMIDE 200 MG PO TABS: 200.0000 mg | ORAL_TABLET | Freq: Two times a day (BID) | ORAL | 5 refills | Status: DC

## 2017-10-11 MED ORDER — TOPIRAMATE 100 MG PO TABS: 100.0000 mg | ORAL_TABLET | Freq: Two times a day (BID) | ORAL | 4 refills | Status: DC

## 2017-10-11 MED ORDER — ROPINIROLE HCL 1 MG PO TABS: ORAL_TABLET | ORAL | 4 refills | Status: DC

## 2017-10-11 NOTE — Progress Notes (Signed)
[facility]  PATIENT: Teresa Nguyen DOB: 12-Jul-1950  REFERRING CLINICIAN:  HISTORY FROM: patient  REASON FOR VISIT: follow up    HISTORICAL  CHIEF COMPLAINT:  Chief Complaint  Patient presents with  . Follow-up  . Recent Seizures    3 sz 10 days ago, then sz last night.      HISTORY OF PRESENT ILLNESS:   UPDATE (10/11/17, VRP): Since last visit, doing well until 1-2 weeks ago, woke up in night with chest muscle spasm; lasted 30 minutes. Then again over next 2 nights, 30-45 minutes. Also last night with similar events. No LOC or altered consciousness.  No aphasia. Tolerating meds. Having more GI issues (diarrhea) in last few months.   UPDATE (03/17/17, VRP): Since last visit, doing well. Tolerating meds. No alleviating or aggravating factors. No seizures. Has seen Dr. Noemi Chapel (psychiatry) and was started on Equetro (carbamazepine ER samples).   UPDATE 08/25/16: Since last visit, no seizures. Had some "aura" sensations which consistent of "pulse" in the head sensation, esp with stress. Tolerating meds.   UPDATE 12/31/15: Last night forgot vimpat dose; this morning at 4am, woke up, had to urinate, stood up and then fell back on to bed, may have had brief LOC. Thinks she had a seizure. No post-ictal confusion.  No tongue biting. Restless legs stable with medication.   UPDATE 12/31/14: Since last visit, doing well, no seizures. Used valium 10mg  x 1 time in the last year for a seizure aura. Tolerating vimpat 200mg  BID + TPX 100mg  BID. Went to ER in Feb for syncope (dx with dehydration).   UPDATE 12/29/13 (LL): Since last visit, went to [facility] to have EMU monitoring. EMU report --> June 15-18, 2015: "Long-term video EEG monitoring captured multiple events; including electrographic frontal hypermotor seizures. She also exhibited episodes of oral dyskinetic activity which is not seizure (no EEG correlate)." It was concluded that her typical arousal events are consistent with  frontal lobe epilepsy. Home medications Vimpat 200 mg twice a day,Topirimate 100 mg twice a day were resumed, but they recommended reducing home dose of Valium as she was taking 10-20 mg when necessary which likely to affects her affect and impairs her communication. She has had several close family members who have died in the last year, including her mother and her sister who died suddenly. Patient is very content on her current medication regimen, stating that she has felt better on Vimpat than ever before. She has not had many nocturnal seizures since starting Vimpat, and states that she was afraid to go to sleep prior to starting it. Her past AEDs have been phenobarbital, phenytoin, Lacosamide, lamotrigine, levetiracetam, Topirimate, valproic acid and zonisamide. Known seizure triggers are eating broccoli or eggplant. RLS symptoms are controlled on Ropinirole, although some nights she must take an extra 0.25 of a tablet to be comfortable.  UPDATE 12/28/12: Outside neurologist records received and reviewed. 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 Since last visit, no more seizures or spells. Her RLS is getting worse, b/c she is running out of ropinirole and was reducing the dose. Still with high stress. Not interested in seeing psychiatry.   UPDATE 06/10/12: Since last visit, sz stopped, until Dec 2013. She was preparing for colonoscopy, and anticipated having diarrhea from bowel prep. Before she took the bowel prep meds, she started with diarrhea. Soon after she had a seizure (no LOC, eye up, head turned, mouth twitching; no generalized convulsions. she could hear  and move, but not talk). She took a valium, and her spell stopped. I still haven't received records from prior neurologist. Patient tells me that she was dx'd with "partial non-epileptic seizures". She was told to take valium prn to stop her seizures at home, in order to avoid freq ER trips.   PRIOR HPI  (12/04/11): 67 year old right-handed female here for evaluation of seizure disorder. At age 89 years old, patient fell down a flight of stairs. Soon after she began to have "petit mall seizures". Patient states she was having inability to speak, convulsions and tongue biting. She started on Dilantin and phenobarbital early in life. Over the years she tried "many different seizure medications". She was living in [place] for most of her life and followed by neurology there. She had extensive testing with EEG, video EEG, sleep study, MRI, without any significant abnormalities. Unclear if any of these seizures were captured during EEG recordings. It sounds like no epileptiform discharges were ever noted. Unfortunately I do not have any of her prior records to review myself for this visit. Patient has been on Vimpat and topiramate over the past year, and now has one episode every 3 weeks of "seizure". Current episodes consist of gagging sensation, inability to speak, without loss of consciousness tongue biting or convulsions.    REVIEW OF SYSTEMS: Full 14 system review of systems performed and negative except: memory loss eye itch joint pain drooling.    ALLERGIES: Allergies  Allergen Reactions  . Peanut Oil Diarrhea    Nuts in general   . Other Diarrhea    Nuts and dark green vegetables cause diarrhea and dehydration  . Sulfa Antibiotics Rash    Red all over, itching    HOME MEDICATIONS: Outpatient Medications Prior to Visit  Medication Sig Dispense Refill  . aspirin-acetaminophen-caffeine (EXCEDRIN MIGRAINE) 250-250-65 MG per tablet Take 1 tablet by mouth every 6 (six) hours as needed for migraine.     Marland Kitchen atorvastatin (LIPITOR) 10 MG tablet Take 10 mg by mouth at bedtime.     . Calcium Carb-Cholecalciferol (CALCIUM + D3) 600-200 MG-UNIT TABS Take by mouth. Calcium 600 mg + Vit D, minerlas    . Carbamazepine (EQUETRO) 100 MG CP12 12 hr capsule Take by mouth. 200mg  po qhs for 2 wks and the 300mg  po  qhs after that.    . diazepam (VALIUM) 10 MG tablet Take 1 tablet (10 mg total) by mouth daily as needed for anxiety. 30 tablet 0  . docusate sodium (COLACE) 100 MG capsule Take 100 mg by mouth at bedtime.    Marland Kitchen levothyroxine (SYNTHROID, LEVOTHROID) 25 MCG tablet Take 25 mcg by mouth daily before breakfast.     . meloxicam (MOBIC) 7.5 MG tablet Take 7.5 mg by mouth 2 (two) times daily.     . methocarbamol (ROBAXIN) 750 MG tablet Take 750 mg by mouth 2 (two) times daily as needed.  2  . polyethylene glycol powder (GLYCOLAX/MIRALAX) powder Take 17 g by mouth 2 (two) times daily. Until daily soft stools  OTC (Patient taking differently: Take 17 g by mouth 2 (two) times daily. Until daily soft stools  (takes one tsp of powder)  OTC) 225 g 0  . rOPINIRole (REQUIP) 1 MG tablet 1mg  in AM and 2mg  in PM 270 tablet 4  . topiramate (TOPAMAX) 100 MG tablet Take 1 tablet (100 mg total) by mouth 2 (two) times daily. 180 tablet 4  . VIMPAT 200 MG TABS tablet TAKE 1 TABLET BY MOUTH TWICE  A DAY 60 tablet 5  . vitamin E 200 UNIT capsule Take 200 Units by mouth at bedtime.     Marland Kitchen ipratropium (ATROVENT) 0.06 % nasal spray USE 2 SPRAYS IN EACH NOSTRIL 4 TIMES A DAY     No facility-administered medications prior to visit.     PAST MEDICAL HISTORY: Past Medical History:  Diagnosis Date  . Arthritis   . 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 Migraine   . Seizure disorder [facility])    since age 61-3  . Seizures ([place])    last sz 12/31/15  . Thyroid disease     PAST SURGICAL HISTORY: Past Surgical History:  Procedure Laterality Date  . BREAST LUMPECTOMY  1990  . TUBAL LIGATION      FAMILY HISTORY: Family History  Problem Relation Age of Onset  . Stroke Father   . Heart attack Father   . Throat cancer Sister   . Seizures Sister   . Migraines Sister   . Diabetes Maternal Grandmother   . Diabetes Maternal Grandfather   . Kidney Stones Sister     SOCIAL HISTORY:  Social History   Socioeconomic History  .  Marital status: Divorced    Spouse name: Not on file  . Number of children: 2  . Years of education: 12th  . Highest education level: Not on file  Occupational History  . Occupation: disabled  Social Needs  . Financial resource strain: Not on file  . Food insecurity:    Worry: Not on file    Inability: Not on file  . Transportation needs:    Medical: Not on file    Non-medical: Not on file  Tobacco Use  . Smoking status: Former Smoker    Last attempt to quit: 04/16/1998    Years since quitting: 19.5  . Smokeless tobacco: Never Used  Substance and Sexual Activity  . Alcohol use: No  . Drug use: No  . Sexual activity: Not Currently  Lifestyle  . Physical activity:    Days per week: Not on file    Minutes per session: Not on file  . Stress: Not on file  Relationships  . Social connections:    Talks on phone: Not on file    Gets together: Not on file    Attends religious service: Not on file    Active member of club or organization: Not on file    Attends meetings of clubs or organizations: Not on file    Relationship status: Not on file  . Intimate partner violence:    Fear of current or ex partner: Not on file    Emotionally abused: Not on file    Physically abused: Not on file    Forced sexual activity: Not on file  Other Topics Concern  . Not on file  Social History Narrative   Patient lives at home alone.   Caffeine Use: 1 cup of coffee daily     PHYSICAL EXAM  GENERAL EXAM/CONSTITUTIONAL: Vitals:  Vitals:   10/11/17 1236  BP: 112/86  Pulse: 76  Weight: 182 lb 3.2 oz (82.6 kg)  Height: 5\' 3"  (1.6 m)   Body mass index is 32.28 kg/m. No exam data present  Patient is in no distress; well developed, nourished and groomed; neck is supple  CARDIOVASCULAR:  Examination of carotid arteries is normal; no carotid bruits  Regular rate and rhythm, no murmurs  Examination of peripheral vascular system by observation and palpation is  normal  EYES:  Ophthalmoscopic exam of optic discs and posterior segments is normal; no papilledema or hemorrhages  MUSCULOSKELETAL:  Gait, strength, tone, movements noted in Neurologic exam below  NEUROLOGIC: MENTAL STATUS:  No flowsheet data found.  awake, alert, oriented to person, place and time  recent and remote memory intact  normal attention and concentration  language fluent, comprehension intact, naming intact,   fund of knowledge appropriate  CRANIAL NERVE:   2nd - no papilledema on fundoscopic exam  2nd, 3rd, 4th, 6th - pupils equal and reactive to light, visual fields full to confrontation, extraocular muscles intact, no nystagmus  5th - facial sensation symmetric  7th - facial strength symmetric  8th - hearing intact  9th - palate elevates symmetrically, uvula midline  11th - shoulder shrug symmetric  12th - tongue protrusion midline  MOTOR:   normal bulk and tone, full strength in the BUE, BLE  SENSORY:   normal and symmetric to light touch, temperature, vibration  COORDINATION:   finger-nose-finger, fine finger movements normal  REFLEXES:   deep tendon reflexes present and symmetric  GAIT/STATION:   narrow based gait    DIAGNOSTIC DATA (LABS, IMAGING, TESTING) - I reviewed patient records, labs, notes, testing and imaging myself where available.  Lab Results  Component Value Date   WBC 4.2 01/10/2016   HGB 12.9 01/10/2016   HCT 40.7 01/10/2016   MCV 93.1 01/10/2016   PLT 167 01/10/2016      Component Value Date/Time   NA 145 01/10/2016 1225   K 4.0 01/10/2016 1225   CL 112 (H) 01/10/2016 1225   CO2 26 01/10/2016 1225   GLUCOSE 123 (H) 01/10/2016 1225   BUN 26 (H) 01/10/2016 1225   CREATININE 0.81 01/10/2016 1225   CALCIUM 9.2 01/10/2016 1225   GFRNONAA >60 01/10/2016 1225   GFRAA >60 01/10/2016 1225   No results found for: CHOL, HDL, LDLCALC, LDLDIRECT, TRIG, CHOLHDL No results found for: HGBA1C No results found  for: VITAMINB12 No results found for: TSH  06/03/14 CT head [I reviewed images myself and agree with interpretation. -VRP]  - normal   05/24/13 MRI brain [I reviewed images myself and agree with interpretation. -VRP]  1. No acute intracranial abnormality. 2. Volume loss and signal changes in the left mesial temporal lobe compatible with mesial temporal sclerosis. 3. Otherwise negative MRI appearance of the brain.    ASSESSMENT AND PLAN  67 y.o. year old female here with history of seizure disorder since age 89-65 years old, diagnosed at [facility] in June 2015 with frontal lobe epilepsy through VEEG. Last seizure on June 2015 (during [facility] video EEG). Also with atypical event on 12/31/15 (syncope vs seizure). Also with some intermixed non-epileptic spells. Also with stress / anxiety issues.   New spells in June 2019, not clearly epileptic seizures.    Dx:  Partial symptomatic epilepsy with complex partial seizures, intractable, without status epilepticus (HCC)  Restless leg syndrome  Anxiety    PLAN:   SEIZURE DISORDER (gutteral sounds, slow response, frontal lobe localization) + non-epileptics spells (oral dyskinetic activity) - continue vimpat 200mg  twice a day  - continue topiramate 100mg  twice a day   RESTLESS LEG SYNDROME - continue ropinirole (1mg  in AM, 2mg  in PM) for RLS  ANXIETY - follow up with psychiatry / psychology for stress/anxiety issues - no more diazepam refills from here (only per psychiatry)  Meds ordered this encounter  Medications  . lacosamide (VIMPAT) 200 MG TABS tablet    Sig:  Take 1 tablet (200 mg total) by mouth 2 (two) times daily.    Dispense:  60 tablet    Refill:  5    This request is for a new prescription for a controlled substance as required by Federal/State law.  . topiramate (TOPAMAX) 100 MG tablet    Sig: Take 1 tablet (100 mg total) by mouth 2 (two) times daily.    Dispense:  180 tablet    Refill:  4  . rOPINIRole  (REQUIP) 1 MG tablet    Sig: 1mg  in AM and 2mg  in PM    Dispense:  270 tablet    Refill:  4   Return in about 1 year (around 10/12/2018).    Penni Bombard, MD 09/14/1495, 02:63 PM Certified in Neurology, Neurophysiology and Neuroimaging  [facility] [address] [place], [place] [address] [phone]

## 2017-11-08 ENCOUNTER — Ambulatory Visit
Admission: RE | Admit: 2017-11-08 | Discharge: 2017-11-08 | Disposition: A | Discharge status: Home / Self Care | Payer: Medicaid Other | Source: Ambulatory Visit | Attending: Internal Medicine | Admitting: Internal Medicine

## 2017-11-08 DIAGNOSIS — E2839 Other primary ovarian failure: Secondary | ICD-10-CM

## 2017-11-08 DIAGNOSIS — Z1231 Encounter for screening mammogram for malignant neoplasm of breast: Secondary | ICD-10-CM

## 2017-12-02 DIAGNOSIS — F331 Major depressive disorder, recurrent, moderate: Secondary | ICD-10-CM | POA: Diagnosis not present

## 2017-12-02 DIAGNOSIS — F411 Generalized anxiety disorder: Secondary | ICD-10-CM | POA: Diagnosis not present

## 2017-12-06 DIAGNOSIS — F411 Generalized anxiety disorder: Secondary | ICD-10-CM | POA: Diagnosis not present

## 2017-12-06 DIAGNOSIS — F331 Major depressive disorder, recurrent, moderate: Secondary | ICD-10-CM | POA: Diagnosis not present

## 2017-12-21 ENCOUNTER — Encounter (HOSPITAL_COMMUNITY): Payer: Self-pay | Admitting: Emergency Medicine

## 2017-12-21 ENCOUNTER — Other Ambulatory Visit: Payer: Self-pay

## 2017-12-21 ENCOUNTER — Telehealth: Payer: Self-pay | Admitting: Diagnostic Neuroimaging

## 2017-12-21 ENCOUNTER — Emergency Department (HOSPITAL_COMMUNITY)
Admission: EM | Admit: 2017-12-21 | Discharge: 2017-12-21 | Disposition: A | Discharge status: Home / Self Care | Payer: Medicare HMO | Attending: Emergency Medicine | Admitting: Emergency Medicine

## 2017-12-21 DIAGNOSIS — Z91010 Allergy to peanuts: Secondary | ICD-10-CM | POA: Diagnosis not present

## 2017-12-21 DIAGNOSIS — Z87891 Personal history of nicotine dependence: Secondary | ICD-10-CM | POA: Insufficient documentation

## 2017-12-21 DIAGNOSIS — G40909 Epilepsy, unspecified, not intractable, without status epilepticus: Secondary | ICD-10-CM | POA: Insufficient documentation

## 2017-12-21 DIAGNOSIS — Z79899 Other long term (current) drug therapy: Secondary | ICD-10-CM | POA: Insufficient documentation

## 2017-12-21 DIAGNOSIS — I10 Essential (primary) hypertension: Secondary | ICD-10-CM | POA: Diagnosis not present

## 2017-12-21 LAB — CBC WITH DIFFERENTIAL/PLATELET
Abs Immature Granulocytes: 0 10*3/uL (ref 0.0–0.1)
BASOS ABS: 0.1 10*3/uL (ref 0.0–0.1)
BASOS PCT: 1 %
EOS ABS: 0.1 10*3/uL (ref 0.0–0.7)
EOS PCT: 2 %
HEMATOCRIT: 39.8 % (ref 36.0–46.0)
Hemoglobin: 12.8 g/dL (ref 12.0–15.0)
Immature Granulocytes: 0 %
LYMPHS ABS: 1.7 10*3/uL (ref 0.7–4.0)
Lymphocytes Relative: 31 %
MCH: 30.4 pg (ref 26.0–34.0)
MCHC: 32.2 g/dL (ref 30.0–36.0)
MCV: 94.5 fL (ref 78.0–100.0)
Monocytes Absolute: 0.6 10*3/uL (ref 0.1–1.0)
Monocytes Relative: 12 %
Neutro Abs: 2.9 10*3/uL (ref 1.7–7.7)
Neutrophils Relative %: 54 %
Platelets: 164 10*3/uL (ref 150–400)
RBC: 4.21 MIL/uL (ref 3.87–5.11)
RDW: 12.2 % (ref 11.5–15.5)
WBC: 5.5 10*3/uL (ref 4.0–10.5)

## 2017-12-21 LAB — COMPREHENSIVE METABOLIC PANEL
ALBUMIN: 3.8 g/dL (ref 3.5–5.0)
ALK PHOS: 83 U/L (ref 38–126)
ALT: 14 U/L (ref 0–44)
AST: 20 U/L (ref 15–41)
Anion gap: 11 (ref 5–15)
BILIRUBIN TOTAL: 0.4 mg/dL (ref 0.3–1.2)
BUN: 24 mg/dL — AB (ref 8–23)
CALCIUM: 8.7 mg/dL — AB (ref 8.9–10.3)
CO2: 24 mmol/L (ref 22–32)
CREATININE: 0.81 mg/dL (ref 0.44–1.00)
Chloride: 109 mmol/L (ref 98–111)
GFR calc Af Amer: >60 mL/min (ref >60)
GFR calc non Af Amer: >60 mL/min (ref >60)
GLUCOSE: 107 mg/dL — AB (ref 70–99)
Potassium: 3.6 mmol/L (ref 3.5–5.1)
Sodium: 144 mmol/L (ref 135–145)
TOTAL PROTEIN: 6.1 g/dL — AB (ref 6.5–8.1)

## 2017-12-21 LAB — CARBAMAZEPINE LEVEL, TOTAL

## 2017-12-21 MED ORDER — LORAZEPAM 2 MG/ML IJ SOLN
1.0000 mg | Freq: Once | INTRAMUSCULAR | Status: AC
  Administered 2017-12-21: 1 mg via INTRAVENOUS
  Filled 2017-12-21: qty 1

## 2017-12-21 NOTE — ED Triage Notes (Signed)
Pt arrives to ED from home with complaints of focal seizures lasting about 10-15 seconds since yesterday. EMS reports starting yesterday around 2 pm pt has had multiple focal seizures. Pt is aware she is having seizure. Pt called 911 herself. Pt had 3 seizures with EMS at scene and 1 seizure in route. EMS reports pt had a seizure while calling 911 dispatch. Pt is not post ictal and is A&O x 4. Ems gave pt 5mg  Versed in route. Pt placed in position of comfort with bed locked and lowered, call bell in reach.

## 2017-12-21 NOTE — Telephone Encounter (Signed)
See ED note for today.  Had several episodes chest spasms, and jaw tightening.  No change in medications.

## 2017-12-21 NOTE — Discharge Instructions (Signed)
Talk with your neurologist regarding whether you should go back to taking carbamazepine Teresa Nguyen).

## 2017-12-21 NOTE — Telephone Encounter (Signed)
Patient calling stating she was at the ED at [facility] yesterday for seizures and needs a soon appointment with Dr. Leta Baptist. Her appointment with Dr. Leta Baptist is not until July of next year.

## 2017-12-21 NOTE — ED Provider Notes (Signed)
[place] EMERGENCY DEPARTMENT Provider Note   CSN: [id number] Arrival date & time: 12/21/17  0127     History   Chief Complaint Chief Complaint  Patient presents with  . Seizures    HPI Teresa Nguyen is a 67 y.o. female.  The history is provided by the patient.  He has a history of complex partial epilepsy, nonepileptic convulsions, hypertension, restless leg syndrome and comes in with multiple seizures today.  She states that seizures consist of her jaw feeling tight and it feels like her chest is moving.  This will last for about 1 minute before resolving.  She estimates 30 seizures during the course of the day.  She did take a dose of diazepam which gave some temporary improvement.  There has been no incontinence and no bit lip or tongue.  She has been compliant with her medications.  Of note, she is scheduled to see her neurologist later this week.  Past Medical History:  Diagnosis Date  . Arthritis   . 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 Migraine   . Seizure disorder [facility])    since age 48-3  . Seizures ([place])    last sz 12/31/15  . Thyroid disease     Patient Active Problem List   Diagnosis Date Noted  . Frontal lobe epilepsy ([place]) 01/01/2014  . Complex partial epilepsy ([place]) 12/29/2013  . Localization-related (focal) (partial) epilepsy and epileptic syndromes with complex partial seizures, without mention of intractable epilepsy 05/09/2013  . Migraine 02/21/2013  . Right lower quadrant pain 02/21/2013  . Hypertension 02/21/2013  . Stress at home 02/21/2013  . Restless leg syndrome 12/28/2012  . Convulsion, non-epileptic ([place]) 12/28/2012    Past Surgical History:  Procedure Laterality Date  . BREAST LUMPECTOMY  1990  . TUBAL LIGATION       OB History   None      Home Medications    Prior to Admission medications   Medication Sig Start Date End Date Taking? Authorizing Provider  aspirin-acetaminophen-caffeine (EXCEDRIN MIGRAINE)  [phone] MG per tablet Take 1 tablet by mouth every 6 (six) hours as needed for migraine.     [provider]  atorvastatin (LIPITOR) 10 MG tablet Take 10 mg by mouth at bedtime.  05/04/13   [provider]  Calcium Carb-Cholecalciferol (CALCIUM + D3) 600-200 MG-UNIT TABS Take by mouth. Calcium 600 mg + Vit D, minerlas    [provider]  Carbamazepine (EQUETRO) 100 MG CP12 12 hr capsule Take by mouth. 200mg  po qhs for 2 wks and the 300mg  po qhs after that.    [provider]  diazepam (VALIUM) 10 MG tablet Take 1 tablet (10 mg total) by mouth daily as needed for anxiety. 08/25/16   Penumalli, Earlean Polka, MD  docusate sodium (COLACE) 100 MG capsule Take 100 mg by mouth at bedtime.    [provider]  lacosamide (VIMPAT) 200 MG TABS tablet Take 1 tablet (200 mg total) by mouth 2 (two) times daily. 10/11/17   Penumalli, Earlean Polka, MD  levothyroxine (SYNTHROID, LEVOTHROID) 25 MCG tablet Take 25 mcg by mouth daily before breakfast.  05/04/13   [provider]  meloxicam (MOBIC) 7.5 MG tablet Take 7.5 mg by mouth 2 (two) times daily.     [provider]  methocarbamol (ROBAXIN) 750 MG tablet Take 750 mg by mouth 2 (two) times daily as needed. 11/30/14   [provider]  polyethylene glycol powder (GLYCOLAX/MIRALAX) powder Take  17 g by mouth 2 (two) times daily. Until daily soft stools  OTC Patient taking differently: Take 17 g by mouth 2 (two) times daily. Until daily soft stools  (takes one tsp of powder)  OTC 01/10/16   Montine Circle, PA-C  rOPINIRole (REQUIP) 1 MG tablet 1mg  in AM and 2mg  in PM 10/11/17   Penumalli, Earlean Polka, MD  topiramate (TOPAMAX) 100 MG tablet Take 1 tablet (100 mg total) by mouth 2 (two) times daily. 10/11/17   Penumalli, Earlean Polka, MD  vitamin E 200 UNIT capsule Take 200 Units by mouth at bedtime.     [provider]    Family History Family History  Problem Relation Age of Onset  . Stroke Father   .  Heart attack Father   . Throat cancer Sister   . Seizures Sister   . Migraines Sister   . Diabetes Maternal Grandmother   . Diabetes Maternal Grandfather   . Kidney Stones Sister     Social History Social History   Tobacco Use  . Smoking status: Former Smoker    Last attempt to quit: 04/16/1998    Years since quitting: 19.6  . Smokeless tobacco: Never Used  Substance Use Topics  . Alcohol use: No  . Drug use: No     Allergies   Peanut oil; Other; and Sulfa antibiotics   Review of Systems Review of Systems  All other systems reviewed and are negative.    Physical Exam Updated Vital Signs BP 140/88 (BP Location: Right Arm)   Pulse 61   Temp 97.8 F (36.6 C) (Oral)   Resp 17   Ht 5\' 4"  (1.626 m)   Wt 82 kg   SpO2 95%   BMI 31.03 kg/m   Physical Exam  Nursing note and vitals reviewed.  67 year old female, resting comfortably and in no acute distress. Vital signs are normal. Oxygen saturation is 95%, which is normal. Head is normocephalic and atraumatic. PERRLA, EOMI. Oropharynx is clear. Neck is nontender and supple without adenopathy or JVD. Back is nontender and there is no CVA tenderness. Lungs are clear without rales, wheezes, or rhonchi. Chest is nontender. Heart has regular rate and rhythm without murmur. Abdomen is soft, flat, nontender without masses or hepatosplenomegaly and peristalsis is normoactive. Extremities have trace edema, full range of motion is present. Skin is warm and dry without rash. Neurologic: Mental status is normal, cranial nerves are intact, there are no motor or sensory deficits.  ED Treatments / Results  Labs (all labs ordered are listed, but only abnormal results are displayed) Labs Reviewed  COMPREHENSIVE METABOLIC PANEL - Abnormal; Notable for the following components:      Result Value   Glucose, Bld 107 (*)    BUN 24 (*)    Calcium 8.7 (*)    Total Protein 6.1 (*)    All other components within normal limits    CARBAMAZEPINE LEVEL, TOTAL - Abnormal; Notable for the following components:   Carbamazepine Lvl <2.0 (*)    All other components within normal limits  CBC WITH DIFFERENTIAL/PLATELET    EKG EKG Interpretation  Date/Time:  Tuesday December 21 2017 01:36:05 EDT Ventricular Rate:  65 PR Interval:    QRS Duration: 93 QT Interval:  438 QTC Calculation: 456 R Axis:   -16 Text Interpretation:  Sinus rhythm Abnormal R-wave progression, early transition Inferior infarct, old When compared with ECG of 06/03/2014, No significant change was found Confirmed by Delora Fuel ([id number]) on 12/21/2017  1:44:33 AM  Procedures Procedures  Medications Ordered in ED Medications  LORazepam (ATIVAN) injection 1 mg (has no administration in time range)     Initial Impression / Assessment and Plan / ED Course  I have reviewed the triage vital signs and the nursing notes.  Pertinent lab results that were available during my care of the patient were reviewed by me and considered in my medical decision making (see chart for details).  Probable seizures and patient with known epileptic and nonepileptic seizures.  Will check screening labs including carbamazepine level.  She is given a dose of diazepam.  Old records are reviewed, and she did have an admission to [facility] for continuous EEG monitoring which documented both epileptic and nonepileptic events.  Laboratory work-up is significant for undetectable carbamazepine level.  I informed patient of this finding, and she admitted that she is no longer taking carbamazepine because she states it was not doing any good.  She has not had any further seizures while in the ED.  She is discharged with instructions to follow-up with her neurologist.  We will need to discuss with her neurologist whether she should remain off of carbamazepine or whether she should resume taking it.  Final Clinical Impressions(s) / ED Diagnoses   Final diagnoses:  Seizure  disorder [facility])    ED Discharge Orders    None       Delora Fuel, MD 19/50/93 [phone]

## 2017-12-22 ENCOUNTER — Ambulatory Visit: Payer: Medicaid Other | Admitting: Diagnostic Neuroimaging

## 2017-12-22 NOTE — Telephone Encounter (Signed)
Spoke to pt and made appt for next week 12-29-17 at 1300 after ED visit for seizures.  She continues on vimpat, topamax, ropinirole. She was appreciative.

## 2017-12-27 ENCOUNTER — Other Ambulatory Visit: Payer: Self-pay

## 2017-12-27 ENCOUNTER — Inpatient Hospital Stay (HOSPITAL_COMMUNITY)
Admission: EM | Admit: 2017-12-27 | Discharge: 2017-12-28 | DRG: 101 | Disposition: A | Discharge status: Home / Self Care | Payer: Medicare HMO | Attending: Internal Medicine | Admitting: Internal Medicine

## 2017-12-27 DIAGNOSIS — E785 Hyperlipidemia, unspecified: Secondary | ICD-10-CM | POA: Diagnosis present

## 2017-12-27 DIAGNOSIS — I160 Hypertensive urgency: Secondary | ICD-10-CM | POA: Diagnosis present

## 2017-12-27 DIAGNOSIS — I10 Essential (primary) hypertension: Secondary | ICD-10-CM | POA: Diagnosis present

## 2017-12-27 DIAGNOSIS — Z841 Family history of disorders of kidney and ureter: Secondary | ICD-10-CM | POA: Diagnosis not present

## 2017-12-27 DIAGNOSIS — Z91018 Allergy to other foods: Secondary | ICD-10-CM | POA: Diagnosis not present

## 2017-12-27 DIAGNOSIS — Z820 Family history of epilepsy and other diseases of the nervous system: Secondary | ICD-10-CM | POA: Diagnosis not present

## 2017-12-27 DIAGNOSIS — Z833 Family history of diabetes mellitus: Secondary | ICD-10-CM

## 2017-12-27 DIAGNOSIS — Z91010 Allergy to peanuts: Secondary | ICD-10-CM | POA: Diagnosis not present

## 2017-12-27 DIAGNOSIS — Z823 Family history of stroke: Secondary | ICD-10-CM | POA: Diagnosis not present

## 2017-12-27 DIAGNOSIS — R569 Unspecified convulsions: Secondary | ICD-10-CM

## 2017-12-27 DIAGNOSIS — E039 Hypothyroidism, unspecified: Secondary | ICD-10-CM | POA: Diagnosis not present

## 2017-12-27 DIAGNOSIS — Z87891 Personal history of nicotine dependence: Secondary | ICD-10-CM | POA: Diagnosis not present

## 2017-12-27 DIAGNOSIS — Z79899 Other long term (current) drug therapy: Secondary | ICD-10-CM

## 2017-12-27 DIAGNOSIS — G40209 Localization-related (focal) (partial) symptomatic epilepsy and epileptic syndromes with complex partial seizures, not intractable, without status epilepticus: Secondary | ICD-10-CM | POA: Diagnosis not present

## 2017-12-27 DIAGNOSIS — G2581 Restless legs syndrome: Secondary | ICD-10-CM | POA: Diagnosis present

## 2017-12-27 DIAGNOSIS — Z72820 Sleep deprivation: Secondary | ICD-10-CM

## 2017-12-27 DIAGNOSIS — Z808 Family history of malignant neoplasm of other organs or systems: Secondary | ICD-10-CM

## 2017-12-27 DIAGNOSIS — Z882 Allergy status to sulfonamides status: Secondary | ICD-10-CM

## 2017-12-27 DIAGNOSIS — Z8249 Family history of ischemic heart disease and other diseases of the circulatory system: Secondary | ICD-10-CM

## 2017-12-27 DIAGNOSIS — M1990 Unspecified osteoarthritis, unspecified site: Secondary | ICD-10-CM | POA: Diagnosis not present

## 2017-12-27 DIAGNOSIS — Z9851 Tubal ligation status: Secondary | ICD-10-CM

## 2017-12-27 DIAGNOSIS — Z79890 Hormone replacement therapy (postmenopausal): Secondary | ICD-10-CM

## 2017-12-27 LAB — URINALYSIS, ROUTINE W REFLEX MICROSCOPIC
Bilirubin Urine: NEGATIVE
GLUCOSE, UA: NEGATIVE mg/dL
HGB URINE DIPSTICK: NEGATIVE
KETONES UR: NEGATIVE mg/dL
LEUKOCYTES UA: NEGATIVE
Nitrite: NEGATIVE
PH: 7 (ref 5.0–8.0)
Protein, ur: NEGATIVE mg/dL
Specific Gravity, Urine: 1.015 (ref 1.005–1.030)

## 2017-12-27 LAB — COMPREHENSIVE METABOLIC PANEL
ALK PHOS: 90 U/L (ref 38–126)
ALT: 15 U/L (ref 0–44)
ANION GAP: 7 (ref 5–15)
AST: 20 U/L (ref 15–41)
Albumin: 4.4 g/dL (ref 3.5–5.0)
BILIRUBIN TOTAL: 0.5 mg/dL (ref 0.3–1.2)
BUN: 24 mg/dL — AB (ref 8–23)
CALCIUM: 9.3 mg/dL (ref 8.9–10.3)
CO2: 27 mmol/L (ref 22–32)
CREATININE: 0.69 mg/dL (ref 0.44–1.00)
Chloride: 110 mmol/L (ref 98–111)
GFR calc Af Amer: >60 mL/min (ref >60)
GFR calc non Af Amer: >60 mL/min (ref >60)
GLUCOSE: 111 mg/dL — AB (ref 70–99)
Potassium: 3.9 mmol/L (ref 3.5–5.1)
Sodium: 144 mmol/L (ref 135–145)
TOTAL PROTEIN: 7.5 g/dL (ref 6.5–8.1)

## 2017-12-27 MED ORDER — ATORVASTATIN CALCIUM 10 MG PO TABS
10.0000 mg | ORAL_TABLET | Freq: Every day | ORAL | Status: DC
  Administered 2017-12-27: 10 mg via ORAL
  Filled 2017-12-27: qty 1

## 2017-12-27 MED ORDER — TOPIRAMATE 100 MG PO TABS
100.0000 mg | ORAL_TABLET | Freq: Two times a day (BID) | ORAL | Status: DC
  Administered 2017-12-27: 100 mg via ORAL

## 2017-12-27 MED ORDER — LEVOTHYROXINE SODIUM 25 MCG PO TABS
25.0000 ug | ORAL_TABLET | Freq: Every day | ORAL | Status: DC
  Administered 2017-12-28: 25 ug via ORAL
  Filled 2017-12-27: qty 1

## 2017-12-27 MED ORDER — AMLODIPINE BESYLATE 5 MG PO TABS
5.0000 mg | ORAL_TABLET | Freq: Every day | ORAL | Status: DC
  Administered 2017-12-27 – 2017-12-28 (×2): 5 mg via ORAL
  Filled 2017-12-27 (×2): qty 1

## 2017-12-27 MED ORDER — ROPINIROLE HCL 1 MG PO TABS
2.0000 mg | ORAL_TABLET | ORAL | Status: DC
  Administered 2017-12-27: 2 mg via ORAL
  Filled 2017-12-27: qty 2

## 2017-12-27 MED ORDER — ONDANSETRON HCL 4 MG PO TABS: 4.0000 mg | ORAL_TABLET | Freq: Four times a day (QID) | ORAL | Status: DC | PRN

## 2017-12-27 MED ORDER — LORAZEPAM 2 MG/ML IJ SOLN
1.0000 mg | Freq: Once | INTRAMUSCULAR | Status: AC
  Administered 2017-12-27: 1 mg via INTRAVENOUS
  Filled 2017-12-27: qty 1

## 2017-12-27 MED ORDER — SODIUM CHLORIDE 0.9 % IV SOLN
INTRAVENOUS | Status: DC
  Administered 2017-12-27 – 2017-12-28 (×3): via INTRAVENOUS

## 2017-12-27 MED ORDER — HEPARIN SODIUM (PORCINE) 5000 UNIT/ML IJ SOLN
5000.0000 [IU] | Freq: Three times a day (TID) | INTRAMUSCULAR | Status: DC
  Administered 2017-12-27 – 2017-12-28 (×2): 5000 [IU] via SUBCUTANEOUS
  Filled 2017-12-27 (×3): qty 1

## 2017-12-27 MED ORDER — ROPINIROLE HCL 1 MG PO TABS
1.0000 mg | ORAL_TABLET | Freq: Every day | ORAL | Status: DC
  Administered 2017-12-28: 1 mg via ORAL
  Filled 2017-12-27: qty 1

## 2017-12-27 MED ORDER — SODIUM CHLORIDE 0.9 % IV SOLN: INTRAVENOUS | Status: DC

## 2017-12-27 MED ORDER — ACETAMINOPHEN 325 MG PO TABS: 650.0000 mg | ORAL_TABLET | Freq: Four times a day (QID) | ORAL | Status: DC | PRN

## 2017-12-27 MED ORDER — HYDRALAZINE HCL 20 MG/ML IJ SOLN
10.0000 mg | Freq: Four times a day (QID) | INTRAMUSCULAR | Status: DC | PRN
  Filled 2017-12-27: qty 1

## 2017-12-27 MED ORDER — LORAZEPAM 2 MG/ML IJ SOLN: 1.0000 mg | INTRAMUSCULAR | Status: DC | PRN

## 2017-12-27 MED ORDER — LORAZEPAM 1 MG PO TABS
1.0000 mg | ORAL_TABLET | Freq: Once | ORAL | Status: AC
  Administered 2017-12-27: 1 mg via ORAL
  Filled 2017-12-27: qty 1

## 2017-12-27 MED ORDER — ACETAMINOPHEN 650 MG RE SUPP: 650.0000 mg | Freq: Four times a day (QID) | RECTAL | Status: DC | PRN

## 2017-12-27 MED ORDER — DOCUSATE SODIUM 100 MG PO CAPS
100.0000 mg | ORAL_CAPSULE | Freq: Every day | ORAL | Status: DC
  Administered 2017-12-27: 100 mg via ORAL
  Filled 2017-12-27: qty 1

## 2017-12-27 MED ORDER — LACOSAMIDE 200 MG PO TABS
200.0000 mg | ORAL_TABLET | Freq: Two times a day (BID) | ORAL | Status: DC
  Administered 2017-12-27 – 2017-12-28 (×2): 200 mg via ORAL
  Filled 2017-12-27 (×2): qty 1

## 2017-12-27 MED ORDER — TOPIRAMATE 100 MG PO TABS
100.0000 mg | ORAL_TABLET | Freq: Two times a day (BID) | ORAL | Status: DC
  Filled 2017-12-27: qty 1

## 2017-12-27 MED ORDER — VITAMIN E 45 MG (100 UNIT) PO CAPS
200.0000 [IU] | ORAL_CAPSULE | Freq: Every day | ORAL | Status: DC
  Administered 2017-12-27: 200 [IU] via ORAL
  Filled 2017-12-27 (×2): qty 2

## 2017-12-27 MED ORDER — LACOSAMIDE 200 MG PO TABS: 200.0000 mg | ORAL_TABLET | Freq: Two times a day (BID) | ORAL | Status: DC

## 2017-12-27 MED ORDER — ROPINIROLE HCL 1 MG PO TABS: 2.0000 mg | ORAL_TABLET | ORAL | Status: DC

## 2017-12-27 MED ORDER — METHOCARBAMOL 750 MG PO TABS: 750.0000 mg | ORAL_TABLET | Freq: Two times a day (BID) | ORAL | Status: DC | PRN

## 2017-12-27 MED ORDER — ONDANSETRON HCL 4 MG/2ML IJ SOLN: 4.0000 mg | Freq: Four times a day (QID) | INTRAMUSCULAR | Status: DC | PRN

## 2017-12-27 NOTE — ED Provider Notes (Signed)
[facility] DEPT Provider Note   CSN: [id number] Arrival date & time: 12/27/17  1523     History   Chief Complaint Chief Complaint  Patient presents with  . Seizures    HPI Teresa Nguyen is a 67 y.o. female.  The history is provided by the patient.  Seizures   This is a chronic problem. The current episode started less than 1 hour ago. The problem has not changed since onset.There were more than 10 seizures. The most recent episode lasted 2 to 5 minutes. Associated symptoms include speech difficulty. Pertinent negatives include no sleepiness, no confusion, no headaches, no visual disturbance, no neck stiffness, no sore throat, no chest pain, no cough, no nausea and no vomiting. spasms, speech difficulty, prior to typical seizures. The episode was witnessed. There was no sensation of an aura present. The seizures continued in the ED. The seizure(s) had facial focality. Possible causes do not include medication or dosage change, missed seizure meds, recent illness or change in alcohol use. There has been no fever. There were no medications administered prior to arrival.    Past Medical History:  Diagnosis Date  . Arthritis   . 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 Migraine   . Seizure disorder [facility])    since age 46-3  . Seizures ([place])    last sz 12/31/15  . Thyroid disease     Patient Active Problem List   Diagnosis Date Noted  . Seizure ([place]) 12/27/2017  . Frontal lobe epilepsy ([place]) 01/01/2014  . Complex partial epilepsy ([place]) 12/29/2013  . Localization-related (focal) (partial) epilepsy and epileptic syndromes with complex partial seizures, without mention of intractable epilepsy 05/09/2013  . Migraine 02/21/2013  . Right lower quadrant pain 02/21/2013  . Hypertension 02/21/2013  . Stress at home 02/21/2013  . Restless leg syndrome 12/28/2012  . 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 BREAST LUMPECTOMY  1990  . TUBAL LIGATION       OB History   None      Home Medications    Prior to Admission medications   Medication Sig Start Date End Date Taking? Authorizing Provider  aspirin-acetaminophen-caffeine (EXCEDRIN MIGRAINE) [phone] MG per tablet Take 1 tablet by mouth every 6 (six) hours as needed for migraine.    Yes [provider]  atorvastatin (LIPITOR) 10 MG tablet Take 10 mg by mouth at bedtime.  05/04/13  Yes [provider]  Calcium Carb-Cholecalciferol (CALCIUM + D3) 600-200 MG-UNIT TABS Take 1 tablet by mouth daily.    Yes [provider]  Carbamazepine (EQUETRO) 100 MG CP12 12 hr capsule Take 300 mg by mouth at bedtime. 200mg  po qhs for 2 wks and the 300mg  po qhs after that   Yes [provider]  diazepam (VALIUM) 10 MG tablet Take 1 tablet (10 mg total) by mouth daily as needed for anxiety. 08/25/16  Yes Penumalli, Earlean Polka, MD  diclofenac sodium (VOLTAREN) 1 % GEL Apply 4 g topically 4 (four) times daily as needed (pain).  12/22/17  Yes [provider]  lacosamide (VIMPAT) 200 MG TABS tablet Take 1 tablet (200 mg total) by mouth 2 (two) times daily. 10/11/17  Yes Penumalli, Earlean Polka, MD  levothyroxine (SYNTHROID, LEVOTHROID) 25 MCG tablet Take 25 mcg by mouth daily before breakfast.  05/04/13  Yes [provider]  meloxicam (MOBIC) 7.5 MG tablet Take 7.5 mg by mouth 2 (two)  times daily.    Yes [provider]  methocarbamol (ROBAXIN) 750 MG tablet Take 750 mg by mouth 2 (two) times daily.  11/30/14  Yes [provider]  Multiple Vitamins-Minerals (MULTIVITAMIN ADULT) TABS Take 1 tablet by mouth daily.   Yes [provider]  OVER THE COUNTER MEDICATION Take 1 capsule by mouth daily as needed (diarrhea prevention). Digestive Aid   Yes [provider]  polyethylene glycol powder (GLYCOLAX/MIRALAX) powder Take 17 g by mouth 2 (two) times daily. Until daily soft  stools  OTC Patient taking differently: Take 17 g by mouth daily.  01/10/16  Yes Montine Circle, PA-C  rOPINIRole (REQUIP) 1 MG tablet 1mg  in AM and 2mg  in PM Patient taking differently: Take 1-2 mg by mouth See admin instructions. Take 1mg  in AM and 2mg  in PM 10/11/17  Yes Penumalli, Earlean Polka, MD  topiramate (TOPAMAX) 100 MG tablet Take 1 tablet (100 mg total) by mouth 2 (two) times daily. 10/11/17  Yes Penumalli, Earlean Polka, MD  vitamin E 200 UNIT capsule Take 200 Units by mouth at bedtime.    Yes [provider]    Family History Family History  Problem Relation Age of Onset  . Stroke Father   . Heart attack Father   . Throat cancer Sister   . Seizures Sister   . Migraines Sister   . Diabetes Maternal Grandmother   . Diabetes Maternal Grandfather   . Kidney Stones Sister     Social History Social History   Tobacco Use  . Smoking status: Former Smoker    Last attempt to quit: 04/16/1998    Years since quitting: 19.7  . Smokeless tobacco: Never Used  Substance Use Topics  . Alcohol use: No  . Drug use: No     Allergies   Peanut oil; Other; and Sulfa antibiotics   Review of Systems Review of Systems  Constitutional: Negative for chills and fever.  HENT: Negative for ear pain and sore throat.   Eyes: Negative for pain and visual disturbance.  Respiratory: Negative for cough and shortness of breath.   Cardiovascular: Negative for chest pain and palpitations.  Gastrointestinal: Negative for abdominal pain, nausea and vomiting.  Genitourinary: Negative for dysuria and hematuria.  Musculoskeletal: Negative for arthralgias and back pain.  Skin: Negative for color change and rash.  Neurological: Positive for seizures and speech difficulty. Negative for dizziness, tremors, syncope, facial asymmetry, weakness, light-headedness, numbness and headaches.  Psychiatric/Behavioral: Negative for confusion.  All other systems reviewed and are negative.    Physical  Exam Updated Vital Signs  ED Triage Vitals  Enc Vitals Group     BP 12/27/17 1629 (!) 212/116     Pulse Rate 12/27/17 1629 77     Resp 12/27/17 1629 18     Temp 12/27/17 1629 98.3 F (36.8 C)     Temp Source 12/27/17 1629 Oral     SpO2 12/27/17 1628 95 %     Weight 12/27/17 1631 180 lb 12.4 oz (82 kg)     Height 12/27/17 1631 5\' 4"  (1.626 m)     Head Circumference --      Peak Flow --      Pain Score 12/27/17 1630 0     Pain Loc --      Pain Edu? --      Excl. in [place]? --     Physical Exam  Constitutional: She appears well-developed and well-nourished. No distress.  HENT:  Head: Normocephalic and atraumatic.  Eyes: Pupils are equal, round, and reactive to light. Conjunctivae and EOM are normal.  Neck: Normal range of motion. Neck supple.  Cardiovascular: Normal rate, regular rhythm, normal heart sounds and intact distal pulses.  No murmur heard. Pulmonary/Chest: Effort normal and breath sounds normal. No respiratory distress.  Abdominal: Soft. There is no tenderness.  Musculoskeletal: She exhibits no edema.  Neurological: She is alert. No sensory deficit. Coordination normal.  Patient moves all extremities, has spasms to her face making it difficult for her to talk has had some grunting, she is able to text on her phone to give me history, 5+/5 strength throughout, normal sensation  Skin: Skin is warm and dry.  Psychiatric: She has a normal mood and affect.  Nursing note and vitals reviewed.    ED Treatments / Results  Labs (all labs ordered are listed, but only abnormal results are displayed) Labs Reviewed  COMPREHENSIVE METABOLIC PANEL - Abnormal; Notable for the following components:      Result Value   Glucose, Bld 111 (*)    BUN 24 (*)    All other components within normal limits  URINALYSIS, ROUTINE W REFLEX MICROSCOPIC - Abnormal; Notable for the following components:   APPearance HAZY (*)    All other components within normal limits  URINE CULTURE  CBC WITH  DIFFERENTIAL/PLATELET  CBC WITH DIFFERENTIAL/PLATELET    EKG None  Radiology No results found.  Procedures Procedures (including critical care time)  Medications Ordered in ED Medications  hydrALAZINE (APRESOLINE) injection 10 mg (has no administration in time range)  LORazepam (ATIVAN) injection 1 mg (has no administration in time range)  0.9 %  sodium chloride infusion ( Intravenous New Bag/Given 12/27/17 1807)  LORazepam (ATIVAN) tablet 1 mg (1 mg Oral Given 12/27/17 1644)     Initial Impression / Assessment and Plan / ED Course  I have reviewed the triage vital signs and the nursing notes.  Pertinent labs & imaging results that were available during my care of the patient were reviewed by me and considered in my medical decision making (see chart for details).     Teresa Nguyen is a 67 year old female w/ history of seizures who presents to the ED with seizure-like activity.  Patient with normal vitals.  No fever.  Patient states about 50 seizure-like events today.  Has a history of frontal lobe epilepsy upon chart review.  Patient has been on Vimpat and Topamax.  She is compliant with her medications.  She is fairly well-controlled but has had increased seizure-like past week.  Patient has been trying to see her outpatient neurologist but has been unable to get in for an appointment.  She continues to have issues today.  Patient according to chart review and discussion with Dr. Malen Gauze with neurology has both nonneuroleptic analeptic seizures.  She has facial spasms and some grunting with her real seizures.  She did exhibit some of those signs.  She appears neurologically intact on exam.  She was given Ativan with improvement of symptoms.  Dr. Malen Gauze recommends admission for EEG at [facility] and patient admitted to the hospitalist service.  Basic labs ordered.  No significant electrolyte abnormality, kidney injury.  This chart was dictated using voice recognition software.  Despite  best efforts to proofread,  errors can occur which can change the documentation meaning.  Final Clinical Impressions(s) / ED Diagnoses   Final diagnoses:  Seizure-like activity [facility])    ED Discharge Orders    None  Lennice Sites, DO 12/27/17 2007

## 2017-12-27 NOTE — ED Notes (Signed)
ED TO INPATIENT HANDOFF REPORT  Name/Age/Gender Teresa Nguyen 67 y.o. female  Code Status   Home/SNF/Other Home  Chief Complaint Seizures  Level of Care/Admitting Diagnosis ED Disposition    ED Disposition Condition [facility] Area: [place] [100100]  Level of Care: Stepdown [14]  Diagnosis: Seizure ([place]) [242353]  Admitting Physician: RAI, [place]  Attending Physician: RAI, RIPUDEEP K [[id number]]  Estimated length of stay: past midnight tomorrow  Certification:: I certify this patient will need inpatient services for at least 2 midnights  PT Class (Do Not Modify): Inpatient [101]  PT Acc Code (Do Not Modify): Private [1]       Medical History Past Medical History:  Diagnosis Date  . Arthritis   . 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 Migraine   . Seizure disorder [facility])    since age 81-3  . Seizures ([place])    last sz 12/31/15  . Thyroid disease     Allergies Allergies  Allergen Reactions  . Peanut Oil Diarrhea    Nuts in general   . Other Diarrhea    Nuts and dark green vegetables cause diarrhea and dehydration  . Sulfa Antibiotics Rash    Red all over, itching    IV Location/Drains/Wounds Patient Lines/Drains/Airways Status   Active Line/Drains/Airways    Name:   Placement date:   Placement time:   Site:   Days:   Peripheral IV 12/27/17 Right Forearm   12/27/17    1741    Forearm   less than 1          Labs/Imaging No results found for this or any previous visit (from the past 48 hour(s)). No results found.  Pending Labs Unresulted Labs (From admission, onward)    Start     Ordered   12/27/17 1810  CBC with Differential/Platelet  Once,   STAT     12/27/17 1810   12/27/17 1751  Urine Culture  STAT,   R     12/27/17 1750   12/27/17 1751  Urinalysis, Routine w reflex microscopic  STAT,   R     12/27/17 1750   12/27/17 1656  CBC with Differential  STAT,   STAT     12/27/17 1655   12/27/17 1656   Comprehensive metabolic panel  STAT,   STAT     12/27/17 1655   Signed and Held  HIV antibody (Routine Testing)  Once,   R     Signed and Held   Signed and Held  Basic metabolic panel  Tomorrow morning,   R     Signed and Held   Signed and Held  CBC  Tomorrow morning,   R     Signed and Held   Signed and Held  CBC  (heparin)  Once,   R    Comments:  Baseline for heparin therapy IF NOT ALREADY DRAWN.  Notify MD if PLT < 100 K.    Signed and Held   Signed and Held  Creatinine, serum  (heparin)  Once,   R    Comments:  Baseline for heparin therapy IF NOT ALREADY DRAWN.    Signed and Held          Vitals/Pain Today's Vitals   12/27/17 1632 12/27/17 1633 12/27/17 1749 12/27/17 1807  BP:   (!) 182/95 (!) 161/95  Pulse:   66 70  Resp:   20 17  Temp:  TempSrc:      SpO2: 97%  99% 100%  Weight:      Height:      PainSc:  0-No pain 0-No pain     Isolation Precautions No active isolations  Medications Medications  hydrALAZINE (APRESOLINE) injection 10 mg (has no administration in time range)  LORazepam (ATIVAN) injection 1 mg (has no administration in time range)  0.9 %  sodium chloride infusion ( Intravenous New Bag/Given 12/27/17 1807)  LORazepam (ATIVAN) tablet 1 mg (1 mg Oral Given 12/27/17 1644)    Mobility walks

## 2017-12-27 NOTE — ED Triage Notes (Signed)
Pt to ed by [facility] with c/o of Focal seizures. PT has hx of this but today they have lasted longer than they normally do of 2 hours. Pt is tired and non verbal at the moment which is Normal for her post ictal period. When [facility] arrived patient was verbal and alert.

## 2017-12-27 NOTE — H&P (Signed)
History and Physical        Hospital Admission Note Date: 12/27/2017  Patient name: Teresa Nguyen Medical record number: [id number] Date of birth: [birth year] Age: 67 y.o. Gender: female  PCP: Nolene Ebbs, MD    Patient coming from: Home  I have reviewed all records in the [place].    Chief Complaint:  Seizures  HPI: Patient is a 67 year old female with a history of hypothyroidism, seizure disorder (frontal lobe epilepsy with complex partial epilepsy and nonepileptic convulsions, follows Dr. Leta Baptist), restless leg syndrome presented to ED with seizures today.  At the time of my encounter, patient is alert and oriented, somewhat tired.  Patient states that she woke up in the morning, felt seizure episodes yesterday when she was seen in ED, had reported about 30 seizures during the day.  She did take a dose of diazepam with temporary improvement.  This morning patient reported that and had similar feeling with symptoms, lip smacking and jaw tightening.  She took another diazepam this morning which helped however after couple of partial seizures returned.  She called her neighbor to help and was brought by EMS.  Currently she is on Topamax and Vimpat, states that she has not tolerated Tegretol in the past and was taken off.  She has upcoming appointment with neurology on 9/18. In ED, patient received Ativan 1 mg with improvement in her symptoms.  She states that her seizures are usually triggered by stress, infection (particularly diarrhea) or change in her medications.  States she had dysuria for 1 week.  ED work-up/course:  In ED, temp 98.3, pulse 77, respirate 18, BP 212/116  Review of Systems: Positives marked in 'bold' Constitutional: Denies fever, chills, diaphoresis, poor appetite and fatigue.  HEENT: Denies photophobia, eye pain, redness, hearing loss, ear  pain, congestion, sore throat, rhinorrhea, sneezing, mouth sores, trouble swallowing, neck pain, neck stiffness and tinnitus.   Respiratory: Denies SOB, DOE, cough, chest tightness,  and wheezing.   Cardiovascular: Denies chest pain, palpitations and leg swelling.  Gastrointestinal: Denies vomiting, abdominal pain, constipation, blood in stool and abdominal distention. + Nausea and diarrhea yesterday Genitourinary: Denies any hematuria, flank pain or difficulty urinating Musculoskeletal: Denies myalgias, back pain, joint swelling, arthralgias and gait problem.  Skin: Denies pallor, rash and wound.  Neurological: Please see HPI Hematological: Denies adenopathy. Easy bruising, personal or family bleeding history  Psychiatric/Behavioral: Denies suicidal ideation, mood changes, confusion, nervousness, sleep disturbance and agitation  Past Medical History: Past Medical History:  Diagnosis Date  . Arthritis   . 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 Migraine   . Seizure disorder [facility])    since age 74-3  . Seizures ([place])    last sz 12/31/15  . Thyroid disease     Past Surgical History:  Procedure Laterality Date  . BREAST LUMPECTOMY  1990  . TUBAL LIGATION      Medications: Prior to Admission medications   Medication Sig Start Date End Date Taking? Authorizing Provider  aspirin-acetaminophen-caffeine (EXCEDRIN MIGRAINE) [phone] MG per tablet Take 1 tablet by mouth every 6 (six) hours as needed for migraine.     [provider]  atorvastatin (  LIPITOR) 10 MG tablet Take 10 mg by mouth at bedtime.  05/04/13   [provider]  Calcium Carb-Cholecalciferol (CALCIUM + D3) 600-200 MG-UNIT TABS Take by mouth. Calcium 600 mg + Vit D, minerlas    [provider]  Carbamazepine (EQUETRO) 100 MG CP12 12 hr capsule Take by mouth. 200mg  po qhs for 2 wks and the 300mg  po qhs after that.    [provider]  diazepam (VALIUM) 10 MG tablet Take 1 tablet (10 mg total) by mouth  daily as needed for anxiety. 08/25/16   Penumalli, Earlean Polka, MD  diclofenac sodium (VOLTAREN) 1 % GEL Apply 4 g topically 4 (four) times daily as needed. 12/22/17   [provider]  docusate sodium (COLACE) 100 MG capsule Take 100 mg by mouth at bedtime.    [provider]  lacosamide (VIMPAT) 200 MG TABS tablet Take 1 tablet (200 mg total) by mouth 2 (two) times daily. 10/11/17   Penumalli, Earlean Polka, MD  levothyroxine (SYNTHROID, LEVOTHROID) 25 MCG tablet Take 25 mcg by mouth daily before breakfast.  05/04/13   [provider]  meloxicam (MOBIC) 7.5 MG tablet Take 7.5 mg by mouth 2 (two) times daily.     [provider]  methocarbamol (ROBAXIN) 750 MG tablet Take 750 mg by mouth 2 (two) times daily as needed. 11/30/14   [provider]  polyethylene glycol powder (GLYCOLAX/MIRALAX) powder Take 17 g by mouth 2 (two) times daily. Until daily soft stools  OTC Patient taking differently: Take 17 g by mouth 2 (two) times daily. Until daily soft stools  (takes one tsp of powder)  OTC 01/10/16   Montine Circle, PA-C  rOPINIRole (REQUIP) 1 MG tablet 1mg  in AM and 2mg  in PM 10/11/17   Penumalli, Earlean Polka, MD  topiramate (TOPAMAX) 100 MG tablet Take 1 tablet (100 mg total) by mouth 2 (two) times daily. 10/11/17   Penumalli, Earlean Polka, MD  vitamin E 200 UNIT capsule Take 200 Units by mouth at bedtime.     [provider]    Allergies:   Allergies  Allergen Reactions  . Peanut Oil Diarrhea    Nuts in general   . Other Diarrhea    Nuts and dark green vegetables cause diarrhea and dehydration  . Sulfa Antibiotics Rash    Red all over, itching    Social History:  reports that she quit smoking about 19 years ago. She has never used smokeless tobacco. She reports that she does not drink alcohol or use drugs.  Family History: Family History  Problem Relation Age of Onset  . Stroke Father   . Heart attack Father   . Throat cancer Sister   . Seizures  Sister   . Migraines Sister   . Diabetes Maternal Grandmother   . Diabetes Maternal Grandfather   . Kidney Stones Sister     Physical Exam: Blood pressure (!) 182/95, pulse 66, temperature 98.3 F (36.8 C), temperature source Oral, resp. rate 20, height 5\' 4"  (1.626 m), weight 82 kg, SpO2 99 %. General: Alert, awake, oriented x3, in no acute distress. Eyes: pink conjunctiva,anicteric sclera, pupils equal and reactive to light and accomodation, HEENT: normocephalic, atraumatic, oropharynx clear Neck: supple, no masses or lymphadenopathy, no goiter, no bruits, no JVD CVS: Regular rate and rhythm, without murmurs, rubs or gallops. No lower extremity edema Resp : Clear to auscultation bilaterally, no wheezing, rales or rhonchi. GI : Soft, nontender, nondistended, positive bowel sounds, no masses. No hepatomegaly.  No hernia.  Musculoskeletal: No clubbing or cyanosis, positive pedal pulses. No contracture. ROM intact  Neuro: Grossly intact, no focal neurological deficits, strength 5/5 upper and lower extremities bilaterally Psych: alert and oriented x 3, normal mood and affect Skin: no rashes or lesions, warm and dry   LABS on Admission: I have personally reviewed all the labs and imagings below    Basic Metabolic Panel: Recent Labs  Lab 12/21/17 0151  NA 144  K 3.6  CL 109  CO2 24  GLUCOSE 107*  BUN 24*  CREATININE 0.81  CALCIUM 8.7*   Liver Function Tests: Recent Labs  Lab 12/21/17 0151  AST 20  ALT 14  ALKPHOS 83  BILITOT 0.4  PROT 6.1*  ALBUMIN 3.8   No results for input(s): LIPASE, AMYLASE in the last 168 hours. No results for input(s): AMMONIA in the last 168 hours. CBC: Recent Labs  Lab 12/21/17 0151  WBC 5.5  NEUTROABS 2.9  HGB 12.8  HCT 39.8  MCV 94.5  PLT 164   Cardiac Enzymes: No results for input(s): CKTOTAL, CKMB, CKMBINDEX, TROPONINI in the last 168 hours. BNP: Invalid input(s): POCBNP CBG: No results for input(s): GLUCAP in the last 168  hours.  Radiological Exams on Admission:  No results found.    EKG: Independently reviewed.  None available from today  Assessment/Plan Principal Problem:   Seizure ([place]) with history of nonepileptic convulsions  and complex partial epilepsy -Given multiple seizure episodes today, will admit to stepdown at least overnight -Placed on Ativan 1 mg IV every 2 hours as needed as needed -Seizure precautions, continue Vimpat, Topamax -Discussed with neurology, Dr. Lorraine Lax, recommended transferred to [facility], will need prolonged EEG and further work-up.  Active Problems:   Restless leg syndrome -Continue Requip  Hypertensive urgency -BP 212/116 in ED, placed on hydralazine IV as needed with parameters and amlodipine 5 mg daily Currently patient is not on any antihypertensives outpatient  Dysuria -Follow UA and culture  Hyperlipidemia Continue Lipitor  Hypothyroidism Continue Synthroid, follow TSH   DVT prophylaxis: Heparin subcu  CODE STATUS: Full CODE STATUS  Consults called: Neurology, Dr. Lorraine Lax  Family Communication: Admission, patients condition and plan of care including tests being ordered have been discussed with the patient  who indicates understanding and agree with the plan and Code Status  Admission status: SDU, to [facility]  Disposition plan: Further plan will depend as patient's clinical course evolves and further radiologic and laboratory data become available.    At the time of admission, it appears that the appropriate admission status for this patient is INPATIENT . This is judged to be reasonable and necessary in order to provide the required intensity of service to ensure the patient's safety given the presenting symptoms multiple seizures, hypertensive urgency, physical exam findings, and initial radiographic and laboratory data in the context of their chronic comorbidities.  The medical decision making on this patient was of high complexity and the  patient is at high risk for clinical deterioration, therefore this is a level 3 visit.   Time Spent on Admission: 65 mins    Caidyn Henricksen M.D. [facility] 12/27/2017, 6:04 PM Pager: [phone]  If 7PM-7AM, please contact night-coverage [email or web address] Password [email or web address]

## 2017-12-27 NOTE — ED Notes (Signed)
Vitals and cardiac monitoring delayed.  RN at bedside placing USIV.

## 2017-12-27 NOTE — Progress Notes (Signed)
Pt admitted from [facility] ED with the c/o of seizures, pt alert and oriented, denies any pain at this time, pt settled in bed with call light at bedside, safety concern addressed accordingly, was however reassured and will continue to monitor, v/s stable. Teresa Nguyen, Jacquelyn Shadrick Efe

## 2017-12-28 ENCOUNTER — Encounter (HOSPITAL_COMMUNITY): Payer: Self-pay | Admitting: *Deleted

## 2017-12-28 ENCOUNTER — Inpatient Hospital Stay (HOSPITAL_COMMUNITY): Payer: Medicare HMO

## 2017-12-28 LAB — CREATININE, SERUM
Creatinine, Ser: 0.77 mg/dL (ref 0.44–1.00)
GFR calc Af Amer: >60 mL/min (ref >60)
GFR calc non Af Amer: >60 mL/min (ref >60)

## 2017-12-28 LAB — URINE CULTURE: CULTURE: NO GROWTH

## 2017-12-28 LAB — CBC
HEMATOCRIT: 41.1 % (ref 36.0–46.0)
HEMOGLOBIN: 13.2 g/dL (ref 12.0–15.0)
MCH: 30.1 pg (ref 26.0–34.0)
MCHC: 32.1 g/dL (ref 30.0–36.0)
MCV: 93.8 fL (ref 78.0–100.0)
Platelets: 164 10*3/uL (ref 150–400)
RBC: 4.38 MIL/uL (ref 3.87–5.11)
RDW: 12.2 % (ref 11.5–15.5)
WBC: 5.4 10*3/uL (ref 4.0–10.5)

## 2017-12-28 LAB — HIV ANTIBODY (ROUTINE TESTING W REFLEX): HIV Screen 4th Generation wRfx: NONREACTIVE

## 2017-12-28 MED ORDER — ROPINIROLE HCL 1 MG PO TABS: 1.0000 mg | ORAL_TABLET | Freq: Every day | ORAL | 0 refills | Status: AC

## 2017-12-28 MED ORDER — AMLODIPINE BESYLATE 5 MG PO TABS: 5.0000 mg | ORAL_TABLET | Freq: Every day | ORAL | 0 refills | Status: AC

## 2017-12-28 MED ORDER — TOPIRAMATE 25 MG PO TABS: 125.0000 mg | ORAL_TABLET | Freq: Two times a day (BID) | ORAL | 0 refills | Status: DC

## 2017-12-28 MED ORDER — TOPIRAMATE 25 MG PO TABS: 125.0000 mg | ORAL_TABLET | Freq: Two times a day (BID) | ORAL | Status: DC

## 2017-12-28 MED ORDER — METHOCARBAMOL 750 MG PO TABS: 750.0000 mg | ORAL_TABLET | Freq: Two times a day (BID) | ORAL | 0 refills | Status: AC | PRN

## 2017-12-28 MED ORDER — DOCUSATE SODIUM 100 MG PO CAPS: 100.0000 mg | ORAL_CAPSULE | Freq: Every day | ORAL | 0 refills | Status: DC

## 2017-12-28 NOTE — Procedures (Signed)
ELECTROENCEPHALOGRAM REPORT   Patient: Teresa Nguyen       Room #: 6Y40H EEG No. ID: [id number] Age: 67 y.o.        Sex: female Referring Physician: [place] Report Date:  12/28/2017        Interpreting Physician: Alexis Goodell  History: Teresa Nguyen is an 67 y.o. female with a history of seizures presenting with breakthrough seizures  Medications:  Norvasc, Lipitor, Colace, Vimpat, Synthroid, Requip, Topamax, Vitamin E  Conditions of Recording:  This is a 21 channel routine scalp EEG performed with bipolar and monopolar montages arranged in accordance to the international 10/20 system of electrode placement. One channel was dedicated to EKG recording.  The patient is in the awake, drowsy and asleep states.  Description:  The waking background activity consists of a low voltage, symmetrical, fairly well organized, 8 Hz alpha activity, seen from the parieto-occipital and posterior temporal regions.  Low voltage fast activity, poorly organized, is seen anteriorly and is at times superimposed on more posterior regions.  A mixture of theta and alpha rhythms are seen from the central and temporal regions. The patient drowses with slowing to irregular, low voltage theta and beta activity.   The patient goes in to a light sleep with symmetrical sleep spindles, vertex central sharp transients and irregular slow activity.   No epileptiform activity is noted.   Hyperventilation was not performed.  Intermittent photic stimulation was performed but failed to illicit any change in the tracing.    IMPRESSION: Normal electroencephalogram, awake, asleep and with activation procedures. There are no focal lateralizing or epileptiform features.   Alexis Goodell, MD Neurology [phone] 12/28/2017, 12:57 PM

## 2017-12-28 NOTE — Plan of Care (Signed)
Adequate for discharge.

## 2017-12-28 NOTE — Consult Note (Signed)
Neurology Consultation Reason for Consult: Seizures Referring Physician: Rai, R  CC: Seizures  History is obtained from: Patient  HPI: Teresa Nguyen is a 67 y.o. female with a history of seizures consisting of several different types who presents with recurrent seizures today.  She states that she typically sleeps very well, but last night had severe difficulty with sleeping and then today after getting up began having frequent seizures.  She estimates somewhere between 30 and 50 seizures today.  Seizures last 10 to 15 seconds and she describes them as jaw/chest tightening, associated with automatisms that she could not control.  She took 2 diazepam 10 mg over the course of about 6 hours but continued to have frequent seizures and therefore presented to the emergency department.  At baseline she does have seizures occurring relatively frequently, with the most recent being last Tuesday prior to this.    ROS: A 14 point ROS was performed and is negative except as noted in the HPI.   Past Medical History:  Diagnosis Date  . Arthritis   . 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 Migraine   . Seizure disorder [facility])    since age 76-3  . Seizures ([place])    last sz 12/31/15  . Thyroid disease      Family History  Problem Relation Age of Onset  . Stroke Father   . Heart attack Father   . Throat cancer Sister   . Seizures Sister   . Migraines Sister   . Diabetes Maternal Grandmother   . Diabetes Maternal Grandfather   . Kidney Stones Sister      Social History:  reports that she quit smoking about 19 years ago. She has never used smokeless tobacco. She reports that she does not drink alcohol or use drugs.   Exam: Current vital signs: BP 101/71 (BP Location: Right Arm)   Pulse 70   Temp 98.1 F (36.7 C) (Oral)   Resp 18   Ht 5\' 3"  (1.6 m)   Wt 82 kg   SpO2 91%   BMI 32.02 kg/m  Vital signs in last 24 hours: Temp:  [98.1 F (36.7 C)-98.3 F (36.8 C)] 98.1 F (36.7 C) (09/16  2044) Pulse Rate:  [66-77] 70 (09/16 1807) Resp:  [17-20] 18 (09/17 0100) BP: (101-212)/(71-116) 101/71 (09/17 0100) SpO2:  [91 %-100 %] 91 % (09/17 0100) Weight:  [82 kg] 82 kg (09/16 2044)   Physical Exam  Constitutional: Appears well-developed and well-nourished.  Psych: Affect appropriate to situation Eyes: No scleral injection HENT: No OP obstrucion Head: Normocephalic.  Cardiovascular: Normal rate and regular rhythm.  Respiratory: Effort normal, non-labored breathing GI: Soft.  No distension. There is no tenderness.  Skin: WDI  Neuro: Mental Status: Patient is awake, alert, oriented to person, place, month, year, and situation. Patient is able to give a clear and coherent history. No signs of aphasia or neglect Cranial Nerves: II: Visual Fields are full. Pupils are equal, round, and reactive to light.   III,IV, VI: EOMI without ptosis or diploplia.  V: Facial sensation is symmetric to temperature VII: Facial movement is symmetric.  VIII: hearing is intact to voice X: Uvula elevates symmetrically XI: Shoulder shrug is symmetric. XII: tongue is midline without atrophy or fasciculations.  Motor: Tone is normal. Bulk is normal. 5/5 strength was present in all four extremities.  Sensory: Sensation is symmetric to light touch and temperature in the arms and legs. Cerebellar: FNF and HKS are intact bilaterally  I have reviewed labs in epic and the results pertinent to this consultation are: UA-negative Normal creatinine  Impression: 67 year old female with breakthrough partial seizures in the setting of sleep deprivation.  It may be that sleep deprivation was enough to bring this on, and hopefully with the benzodiazepine she will be able to get rest at night.  I do think with the frequency of the seizures that she is describing I would favor increasing her antiepileptics.  She does have some room to move on topiramate  Recommendations: 1) continue Vimpat 200 mg twice  daily 2) increase topiramate to 125 mg twice daily 3) neurology will continue to follow  Roland Rack, MD [facility] [phone]  If 7pm- 7am, please page neurology on call as listed in [place].

## 2017-12-28 NOTE — Care Management Note (Signed)
Case Management Note  Patient Details  Name: Teresa Nguyen MRN: [id number] Date of Birth: 10/22/1950  Subjective/Objective:    Pt in with seizures. He is from home alone.                 Action/Plan: Pt discharging home with self care. Pt has PCP, insurance and transportation home.   Expected Discharge Date:  12/28/17               Expected Discharge Plan:  Home/Self Care  In-House Referral:     Discharge planning Services     Post Acute Care Choice:    Choice offered to:     DME Arranged:    DME Agency:     HH Arranged:    HH Agency:     Status of Service:  Completed, signed off  If discussed at [employer] of Stay Meetings, dates discussed:    Additional Comments:  Pollie Friar, RN 12/28/2017, 4:24 PM

## 2017-12-28 NOTE — Discharge Summary (Signed)
Physician Discharge Summary  Patient ID: Teresa Nguyen MRN: [id number] DOB/AGE: 65-Apr-1952 65 y.o.  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 Leta Baptist), restless leg syndrome admitted with seizures.   .  Patient reported having had 30 seizures prior to presentation to the hospital.    Patient was on Topamax and Vimpat prior to admission, and stated that she had not tolerated Tegretol in the past and was taken off Tegretol.    Patient was admitted for further assessment and management.  Neurology team directed patient's care.  Topamax has been increased from 100 mg p.o. twice daily to 125 mg p.o. twice daily.  No seizure activity was noted during the hospital stay.  EEG came back normal.  Patient has appointment with neurology team on 12/29/2017.  Neurologic team has cleared patient for discharge.  Patient will be discharge back to come to the care of the primary care provider and the neurology team.  Patient has been advised to avoid driving and to avoid climbing heights, until cleared by the primary care provider or the neurology team.  On presentation to the hospital, the blood pressure was 212/116 mmHg.  Blood pressure has been cautiously controlled.  Last documented blood pressure prior to discharge was 100/76 mmHg.  Consults: neurology  Significant Diagnostic Studies: EEG.  EEG came back normal as per neurologist.  Discharge Exam: Blood pressure 100/76, pulse 63, temperature 98.6 F (37 C), temperature source Oral, resp. rate 17, height 5\' 3"  (1.6 m), weight 82  kg, SpO2 91 %.  Disposition: Discharge disposition: 01-Home or Self Care.  Discharge Instructions    Diet - low sodium heart healthy   Complete by:  As directed    Increase activity slowly   Complete by:  As directed      Allergies as of 12/28/2017      Reactions   Peanut Oil Diarrhea   Nuts in general    Other Diarrhea   Nuts and dark green vegetables cause diarrhea and dehydration   Sulfa Antibiotics Rash   Red all over, itching      Medication List    STOP taking these medications   aspirin-acetaminophen-caffeine 250-250-65 MG tablet Commonly known as:  EXCEDRIN MIGRAINE   Calcium + D3 600-200 MG-UNIT Tabs   diazepam 10 MG tablet Commonly known as:  VALIUM   diclofenac sodium 1 % Gel Commonly known as:  VOLTAREN   EQUETRO 100 MG Cp12 12 hr capsule Generic drug:  Carbamazepine   meloxicam 7.5 MG tablet Commonly known as:  MOBIC   OVER THE COUNTER MEDICATION   polyethylene glycol powder powder Commonly known as:  GLYCOLAX/MIRALAX     TAKE these medications   amLODipine 5 MG tablet Commonly known as:  NORVASC Take 1 tablet (5 mg total) by mouth daily. Start taking on:  12/29/2017   atorvastatin 10 MG tablet Commonly known as:  LIPITOR Take 10 mg by mouth at bedtime.   docusate sodium 100 MG capsule Commonly known as:  COLACE Take 1 capsule (100 mg total) by mouth at bedtime.   lacosamide 200 MG Tabs tablet Commonly known as:  VIMPAT Take 1 tablet (  200 mg total) by mouth 2 (two) times daily.   levothyroxine 25 MCG tablet Commonly known as:  SYNTHROID, LEVOTHROID Take 25 mcg by mouth daily before breakfast.   methocarbamol 750 MG tablet Commonly known as:  ROBAXIN Take 1 tablet (750 mg total) by mouth 2 (two) times daily as needed for muscle spasms. What changed:    when to take this  reasons to take this   MULTIVITAMIN ADULT Tabs Take 1 tablet by mouth daily.   rOPINIRole 1 MG tablet Commonly known as:  REQUIP Take 1 tablet (1 mg total)  by mouth daily. Start taking on:  12/29/2017 What changed:    how much to take  how to take this  when to take this  additional instructions   topiramate 25 MG tablet Commonly known as:  TOPAMAX Take 5 tablets (125 mg total) by mouth 2 (two) times daily. What changed:    medication strength  how much to take   vitamin E 200 UNIT capsule Take 200 Units by mouth at bedtime.        SignedBonnell Public 12/28/2017, 3:37 PM

## 2017-12-28 NOTE — Progress Notes (Signed)
Reason for consult:   Subjective: Patient appears alert.  She no longer is having any seizures    ROS: negative except above   Examination  Vital signs in last 24 hours: Temp:  [98 F (36.7 C)-98.6 F (37 C)] 98.6 F (37 C) (09/17 0800) Pulse Rate:  [63-73] 73 (09/17 1214) Resp:  [15-18] 18 (09/17 1214) BP: (100-161)/(71-92) 108/72 (09/17 1214) SpO2:  [91 %-100 %] 100 % (09/17 1214) Weight:  [82 kg] 82 kg (09/16 2044)  General: Not in distress, cooperative CVS: pulse-normal rate and rhythm RS: breathing comfortably Extremities: normal   Neuro: MS: Alert, oriented, follows commands CN: pupils equal and reactive,  EOMI, face symmetric Motor: 5/5 strength in all 4 extremities Coordination: normal Gait: not tested  Basic Metabolic Panel: Recent Labs  Lab 12/27/17 1746 12/28/17 0223  NA 144  --   K 3.9  --   CL 110  --   CO2 27  --   GLUCOSE 111*  --   BUN 24*  --   CREATININE 0.69 0.77  CALCIUM 9.3  --     CBC: Recent Labs  Lab 12/28/17 0223  WBC 5.4  HGB 13.2  HCT 41.1  MCV 93.8  PLT 164     Coagulation Studies: No results for input(s): LABPROT, INR in the last 72 hours.  Imaging Reviewed:     ASSESSMENT AND PLAN  Frontal lobe epilepsy -No longer having seizures this morning -Patient can be discharged -Continue Vimpat, increased dose of Topamax to 125 mg BID  Follow up with Outpatient neurology, has appointment tomorrow.    Karena Addison Aroor [facility] Pager Number [phone] For questions after 7pm please refer to AMION to reach the Neurologist on call

## 2017-12-28 NOTE — Progress Notes (Signed)
EEG Completed; Results Pending  

## 2017-12-29 ENCOUNTER — Encounter: Payer: Self-pay | Admitting: Diagnostic Neuroimaging

## 2017-12-29 ENCOUNTER — Ambulatory Visit (INDEPENDENT_AMBULATORY_CARE_PROVIDER_SITE_OTHER): Payer: Medicare HMO | Admitting: Diagnostic Neuroimaging

## 2017-12-29 VITALS — BP 140/92 | HR 72 | Ht 63.0 in | Wt 179.8 lb

## 2017-12-29 DIAGNOSIS — R569 Unspecified convulsions: Secondary | ICD-10-CM | POA: Diagnosis not present

## 2017-12-29 DIAGNOSIS — G40802 Other epilepsy, not intractable, without status epilepticus: Secondary | ICD-10-CM

## 2017-12-29 DIAGNOSIS — G40219 Localization-related (focal) (partial) symptomatic epilepsy and epileptic syndromes with complex partial seizures, intractable, without status epilepticus: Secondary | ICD-10-CM | POA: Diagnosis not present

## 2017-12-29 MED ORDER — TOPIRAMATE 25 MG PO TABS: 25.0000 mg | ORAL_TABLET | Freq: Two times a day (BID) | ORAL | 12 refills | Status: DC

## 2017-12-29 MED ORDER — TOPIRAMATE 100 MG PO TABS: 100.0000 mg | ORAL_TABLET | Freq: Two times a day (BID) | ORAL | 12 refills | Status: DC

## 2017-12-29 MED ORDER — LACOSAMIDE 200 MG PO TABS: 200.0000 mg | ORAL_TABLET | Freq: Two times a day (BID) | ORAL | 5 refills | Status: DC

## 2017-12-29 NOTE — Progress Notes (Addendum)
[facility]  PATIENT: Teresa Nguyen DOB: Mar 06, 1951  REFERRING CLINICIAN:  HISTORY FROM: patient  REASON FOR VISIT: follow up    HISTORICAL  CHIEF COMPLAINT:  Chief Complaint  Patient presents with  . Follow-up    Rm 7, alone.  (has driver).    . Seizures    Seen in [facility] for seizures 9-10 and 12-27-17. 30 sz,EEG normal, increased topamax 125mg  po bid. No sz since increase.    HISTORY OF PRESENT ILLNESS:   UPDATE (12/29/17, VRP): Since last visit, doing well until 12/21/17 and 12/27/17 --> chest muscle spasm, difficulty talking, but no LOC. Each attack lasted 10-20 seconds, 30-50 attacks. Went to ER on 12/21/17. Then again to ER in 12/27/17. Had been taking CBZ for 3 months prior, but felt like it was triggering seizures, so she stopped in early Sept 2019. Mood is stable.   UPDATE (10/11/17, VRP): Since last visit, doing well until 1-2 weeks ago, woke up in night with chest muscle spasm; lasted 30 minutes. Then again over next 2 nights, 30-45 minutes. Also last night with similar events. No LOC or altered consciousness.  No aphasia. Tolerating meds. Having more GI issues (diarrhea) in last few months.   UPDATE (03/17/17, VRP): Since last visit, doing well. Tolerating meds. No alleviating or aggravating factors. No seizures. Has seen Dr. Noemi Chapel (psychiatry) and was started on Equetro (carbamazepine ER samples).   UPDATE 08/25/16: Since last visit, no seizures. Had some "aura" sensations which consistent of "pulse" in the head sensation, esp with stress. Tolerating meds.   UPDATE 12/31/15: Last night forgot vimpat dose; this morning at 4am, woke up, had to urinate, stood up and then fell back on to bed, may have had brief LOC. Thinks she had a seizure. No post-ictal confusion.  No tongue biting. Restless legs stable with medication.   UPDATE 12/31/14: Since last visit, doing well, no seizures. Used valium 10mg  x 1 time in the last year for a seizure aura. Tolerating  vimpat 200mg  BID + TPX 100mg  BID. Went to ER in Feb for syncope (dx with dehydration).   UPDATE 12/29/13 (LL): Since last visit, went to [facility] to have EMU monitoring. EMU report --> June 15-18, 2015: "Long-term video EEG monitoring captured multiple events; including electrographic frontal hypermotor seizures. She also exhibited episodes of oral dyskinetic activity which is not seizure (no EEG correlate)." It was concluded that her typical arousal events are consistent with frontal lobe epilepsy. Home medications Vimpat 200 mg twice a day,Topirimate 100 mg twice a day were resumed, but they recommended reducing home dose of Valium as she was taking 10-20 mg when necessary which likely to affects her affect and impairs her communication. She has had several close family members who have died in the last year, including her mother and her sister who died suddenly. Patient is very content on her current medication regimen, stating that she has felt better on Vimpat than ever before. She has not had many nocturnal seizures since starting Vimpat, and states that she was afraid to go to sleep prior to starting it. Her past AEDs have been phenobarbital, phenytoin, Lacosamide, lamotrigine, levetiracetam, Topirimate, valproic acid and zonisamide. Known seizure triggers are eating broccoli or eggplant. RLS symptoms are controlled on Ropinirole, although some nights she must take an extra 0.25 of a tablet to be comfortable.  UPDATE 12/28/12: Outside neurologist records received and reviewed. 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 Since last visit, no  more seizures or spells. Her RLS is getting worse, b/c she is running out of ropinirole and was reducing the dose. Still with high stress. Not interested in seeing psychiatry.   UPDATE 06/10/12: Since last visit, sz stopped, until Dec 2013. She was preparing for colonoscopy, and anticipated having diarrhea from bowel prep. Before she took  the bowel prep meds, she started with diarrhea. Soon after she had a seizure (no LOC, eye up, head turned, mouth twitching; no generalized convulsions. she could hear and move, but not talk). She took a valium, and her spell stopped. I still haven't received records from prior neurologist. Patient tells me that she was dx'd with "partial non-epileptic seizures". She was told to take valium prn to stop her seizures at home, in order to avoid freq ER trips.   PRIOR HPI (12/04/11): 67 year old right-handed female here for evaluation of seizure disorder. At age 89 years old, patient fell down a flight of stairs. Soon after she began to have "petit mall seizures". Patient states she was having inability to speak, convulsions and tongue biting. She started on Dilantin and phenobarbital early in life. Over the years she tried "many different seizure medications". She was living in [place] for most of her life and followed by neurology there. She had extensive testing with EEG, video EEG, sleep study, MRI, without any significant abnormalities. Unclear if any of these seizures were captured during EEG recordings. It sounds like no epileptiform discharges were ever noted. Unfortunately I do not have any of her prior records to review myself for this visit. Patient has been on Vimpat and topiramate over the past year, and now has one episode every 3 weeks of "seizure". Current episodes consist of gagging sensation, inability to speak, without loss of consciousness tongue biting or convulsions.    REVIEW OF SYSTEMS: Full 14 system review of systems performed and negative except: seizure drooling memory loss.   ALLERGIES: Allergies  Allergen Reactions  . Peanut Oil Diarrhea    Nuts in general   . Other Diarrhea    Nuts and dark green vegetables cause diarrhea and dehydration  . Sulfa Antibiotics Rash    Red all over, itching    HOME MEDICATIONS: Outpatient Medications Prior to Visit  Medication Sig Dispense  Refill  . amLODipine (NORVASC) 5 MG tablet Take 1 tablet (5 mg total) by mouth daily. 30 tablet 0  . atorvastatin (LIPITOR) 10 MG tablet Take 10 mg by mouth at bedtime.     . docusate sodium (COLACE) 100 MG capsule Take 1 capsule (100 mg total) by mouth at bedtime. 30 capsule 0  . lacosamide (VIMPAT) 200 MG TABS tablet Take 1 tablet (200 mg total) by mouth 2 (two) times daily. 60 tablet 5  . levothyroxine (SYNTHROID, LEVOTHROID) 25 MCG tablet Take 25 mcg by mouth daily before breakfast.     . methocarbamol (ROBAXIN) 750 MG tablet Take 1 tablet (750 mg total) by mouth 2 (two) times daily as needed for muscle spasms. 20 tablet 0  . Multiple Vitamins-Minerals (MULTIVITAMIN ADULT) TABS Take 1 tablet by mouth daily.    Marland Kitchen rOPINIRole (REQUIP) 1 MG tablet Take 1 tablet (1 mg total) by mouth daily. 30 tablet 0  . topiramate (TOPAMAX) 25 MG capsule Take 25 mg by mouth 2 (two) times daily.    Marland Kitchen topiramate (TOPAMAX) 25 MG tablet Take 5 tablets (125 mg total) by mouth 2 (two) times daily. 60 tablet 0  . vitamin E 200 UNIT capsule Take 200 Units  by mouth at bedtime.      No facility-administered medications prior to visit.     PAST MEDICAL HISTORY: Past Medical History:  Diagnosis Date  . Arthritis   . 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 Migraine   . Seizure disorder [facility])    since age 9-3  . Seizures ([place])    last sz 12/31/15  . Thyroid disease     PAST SURGICAL HISTORY: Past Surgical History:  Procedure Laterality Date  . BREAST LUMPECTOMY  1990  . TUBAL LIGATION      FAMILY HISTORY: Family History  Problem Relation Age of Onset  . Stroke Father   . Heart attack Father   . Throat cancer Sister   . Seizures Sister   . Migraines Sister   . Diabetes Maternal Grandmother   . Diabetes Maternal Grandfather   . Kidney Stones Sister     SOCIAL HISTORY:  Social History   Socioeconomic History  . Marital status: Divorced    Spouse name: Not on file  . Number of children: 2  . Years of  education: 12th  . Highest education level: Not on file  Occupational History  . Occupation: disabled  Social Needs  . Financial resource strain: Not on file  . Food insecurity:    Worry: Not on file    Inability: Not on file  . Transportation needs:    Medical: Not on file    Non-medical: Not on file  Tobacco Use  . Smoking status: Former Smoker    Last attempt to quit: 04/16/1998    Years since quitting: 19.7  . Smokeless tobacco: Never Used  Substance and Sexual Activity  . Alcohol use: No  . Drug use: No  . Sexual activity: Not Currently  Lifestyle  . Physical activity:    Days per week: Not on file    Minutes per session: Not on file  . Stress: Not on file  Relationships  . Social connections:    Talks on phone: Not on file    Gets together: Not on file    Attends religious service: Not on file    Active member of club or organization: Not on file    Attends meetings of clubs or organizations: Not on file    Relationship status: Not on file  . Intimate partner violence:    Fear of current or ex partner: Not on file    Emotionally abused: Not on file    Physically abused: Not on file    Forced sexual activity: Not on file  Other Topics Concern  . Not on file  Social History Narrative   Patient lives at home alone.   Caffeine Use: 1 cup of coffee daily     PHYSICAL EXAM  GENERAL EXAM/CONSTITUTIONAL: Vitals:  Vitals:   12/29/17 1243  BP: (!) 140/92  Pulse: 72  Weight: 179 lb 12.8 oz (81.6 kg)  Height: 5\' 3"  (1.6 m)   Body mass index is 31.85 kg/m. No exam data present  Patient is in no distress; well developed, nourished and groomed; neck is supple  MASKED FACIES  CARDIOVASCULAR:  Examination of carotid arteries is normal; no carotid bruits  Regular rate and rhythm, no murmurs  Examination of peripheral vascular system by observation and palpation is normal  EYES:  Ophthalmoscopic exam of optic discs and posterior segments is normal; no  papilledema or hemorrhages  MUSCULOSKELETAL:  Gait, strength, tone, movements noted in Neurologic exam below  NEUROLOGIC: MENTAL  STATUS:  No flowsheet data found.  awake, alert, oriented to person, place and time  recent and remote memory intact  normal attention and concentration  language fluent, comprehension intact, naming intact  fund of knowledge appropriate  CRANIAL NERVE:   2nd - no papilledema on fundoscopic exam  2nd, 3rd, 4th, 6th - pupils equal and reactive to light, visual fields full to confrontation, extraocular muscles intact, no nystagmus  5th - facial sensation symmetric  7th - facial strength symmetric  8th - hearing intact  9th - palate elevates symmetrically, uvula midline  11th - shoulder shrug symmetric  12th - tongue protrusion midline  MOTOR:   normal bulk and tone, full strength in the BUE, BLE  TRIGGER FINGER IN RIGHT HAND DIGIT 5  SENSORY:   normal and symmetric to light touch  COORDINATION:   finger-nose-finger, fine finger movements normal  REFLEXES:   deep tendon reflexes TRACE and symmetric  GAIT/STATION:   narrow based gait    DIAGNOSTIC DATA (LABS, IMAGING, TESTING) - I reviewed patient records, labs, notes, testing and imaging myself where available.  Lab Results  Component Value Date   WBC 5.4 12/28/2017   HGB 13.2 12/28/2017   HCT 41.1 12/28/2017   MCV 93.8 12/28/2017   PLT 164 12/28/2017      Component Value Date/Time   NA 144 12/27/2017 1746   K 3.9 12/27/2017 1746   CL 110 12/27/2017 1746   CO2 27 12/27/2017 1746   GLUCOSE 111 (H) 12/27/2017 1746   BUN 24 (H) 12/27/2017 1746   CREATININE 0.77 12/28/2017 0223   CALCIUM 9.3 12/27/2017 1746   PROT 7.5 12/27/2017 1746   ALBUMIN 4.4 12/27/2017 1746   AST 20 12/27/2017 1746   ALT 15 12/27/2017 1746   ALKPHOS 90 12/27/2017 1746   BILITOT 0.5 12/27/2017 1746   GFRNONAA >60 12/28/2017 0223   GFRAA >60 12/28/2017 0223   No results found for:  CHOL, HDL, LDLCALC, LDLDIRECT, TRIG, CHOLHDL No results found for: HGBA1C No results found for: VITAMINB12 No results found for: TSH  06/03/14 CT head [I reviewed images myself and agree with interpretation. -VRP]  - normal   05/24/13 MRI brain [I reviewed images myself and agree with interpretation. -VRP]  1. No acute intracranial abnormality. 2. Volume loss and signal changes in the left mesial temporal lobe compatible with mesial temporal sclerosis. 3. Otherwise negative MRI appearance of the brain.    ASSESSMENT AND PLAN  68 y.o. year old female here with history of seizure disorder since age 38-52 years old, diagnosed at [facility] in June 2015 with frontal lobe epilepsy through VEEG. Last seizure on June 2015 (during [facility] video EEG). Also with atypical event on 12/31/15 (syncope vs seizure). Also with some intermixed non-epileptic spells. Also with stress / anxiety issues.   New spells in June 2019, not clearly epileptic seizures.    Dx:  Partial symptomatic epilepsy with complex partial seizures, intractable, without status epilepticus ([place])  Frontal lobe epilepsy ([place])  Nonepileptic episode ([place])    PLAN:   SEIZURE DISORDER (gutteral sounds, slow response, frontal lobe localization) + non-epileptic spells (oral dyskinetic activity) - continue vimpat 200mg  twice a day  - continue topiramate 125mg  twice a day - refer back to [facility] epilepsy clinic for eval of seizure vs non-epileptic spells - According to [place] law, you can not drive unless you are seizure / syncope free for at least 6 months and under physician's care.  - Please maintain precautions. Do  not participate in activities where a loss of awareness could harm you or someone else. No swimming alone, no tub bathing, no hot tubs, no driving, no operating motorized vehicles (cars, ATVs, motocycles, etc), lawnmowers, power tools or firearms. No standing at heights, such as rooftops, ladders or stairs. Avoid hot  objects such as stoves, heaters, open fires. Wear a helmet when riding a bicycle, scooter, skateboard, etc. and avoid areas of traffic. Set your water heater to 120 degrees or less.   RESTLESS LEG SYNDROME - continue ropinirole (1mg  in AM, 2mg  in PM) for RLS  ANXIETY - follow up with psychiatry / psychology for stress/anxiety issues - no more diazepam refills from here (only per psychiatry)  Meds ordered this encounter  Medications  . topiramate (TOPAMAX) 25 MG tablet    Sig: Take 1 tablet (25 mg total) by mouth 2 (two) times daily.    Dispense:  60 tablet    Refill:  12  . topiramate (TOPAMAX) 100 MG tablet    Sig: Take 1 tablet (100 mg total) by mouth 2 (two) times daily.    Dispense:  60 tablet    Refill:  12  . lacosamide (VIMPAT) 200 MG TABS tablet    Sig: Take 1 tablet (200 mg total) by mouth 2 (two) times daily.    Dispense:  60 tablet    Refill:  5    This request is for a new prescription for a controlled substance as required by Federal/State law.   Orders Placed This Encounter  Procedures  . Ambulatory referral to Neurology   Return in about 6 months (around 06/29/2018).    Penni Bombard, MD 9/43/2761, 4:70 PM Certified in Neurology, Neurophysiology and Neuroimaging  [facility] [address], [place], [place] [address] [phone]

## 2017-12-29 NOTE — Patient Instructions (Signed)
SEIZURE DISORDER + non-epileptic spells - continue vimpat 200mg  twice a day  - continue topiramate 125mg  twice a day - refer back to [facility] epilepsy clinic for eval of seizure vs non-epileptic spells

## 2018-01-04 ENCOUNTER — Telehealth: Payer: Self-pay | Admitting: Diagnostic Neuroimaging

## 2018-01-04 NOTE — Telephone Encounter (Signed)
Pt requesting a call back stating her insurance is needing an explanation on the increased dosages for Topamax, in order to continue to pay for medication

## 2018-01-04 NOTE — Telephone Encounter (Signed)
Spoke to [employer], Software engineer, after speaking to pt.  Pt received letter about needing PA due to 25mg  tabs, [employer] ,  her  part D not approving refill. When spoke to [place].  She stated that previous pcp, did topamax 25mg  po tabs (5 tabs po bid) # 60 was filled 12-28-17 and pt was not able to receive our prescription since it was too early.  She did run this thru and now did state that 25mg  po tablets did go thru and pt can pick up. Pt had picked up 100mg  tablets 12/29/17.  Her [employer] PART D ID # [id number] RX BIN [id number] RX PCN [id number] RXGRP [id number].  [phone], fax [phone].  Pt informed that she would be able to pick up the 25mg  tablets with no problem.  She verbalized understanding.

## 2018-01-18 ENCOUNTER — Ambulatory Visit (INDEPENDENT_AMBULATORY_CARE_PROVIDER_SITE_OTHER): Payer: Medicare HMO | Admitting: Sports Medicine

## 2018-01-18 ENCOUNTER — Other Ambulatory Visit: Payer: Self-pay

## 2018-01-18 ENCOUNTER — Ambulatory Visit: Payer: Self-pay

## 2018-01-18 ENCOUNTER — Encounter: Payer: Self-pay | Admitting: Sports Medicine

## 2018-01-18 DIAGNOSIS — M779 Enthesopathy, unspecified: Principal | ICD-10-CM

## 2018-01-18 DIAGNOSIS — M79672 Pain in left foot: Secondary | ICD-10-CM

## 2018-01-18 DIAGNOSIS — G588 Other specified mononeuropathies: Secondary | ICD-10-CM | POA: Diagnosis not present

## 2018-01-18 DIAGNOSIS — M778 Other enthesopathies, not elsewhere classified: Secondary | ICD-10-CM

## 2018-01-18 MED ORDER — METHYLPREDNISOLONE 4 MG PO TBPK: ORAL_TABLET | ORAL | 0 refills | Status: DC
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
## 2018-02-08 ENCOUNTER — Encounter: Payer: Self-pay | Admitting: Sports Medicine

## 2018-02-08 ENCOUNTER — Ambulatory Visit (INDEPENDENT_AMBULATORY_CARE_PROVIDER_SITE_OTHER): Payer: Medicare HMO | Admitting: Sports Medicine

## 2018-02-08 DIAGNOSIS — G588 Other specified mononeuropathies: Secondary | ICD-10-CM | POA: Diagnosis not present

## 2018-02-08 DIAGNOSIS — M779 Enthesopathy, unspecified: Secondary | ICD-10-CM

## 2018-02-08 DIAGNOSIS — M79672 Pain in left foot: Secondary | ICD-10-CM

## 2018-02-08 MED ORDER — TRIAMCINOLONE ACETONIDE 10 MG/ML IJ SUSP: 10.0000 mg | Freq: Once | INTRAMUSCULAR | Status: DC

## 2018-02-08 NOTE — Progress Notes (Signed)
Subjective: Teresa Nguyen is a 67 y.o. female patient who returns office for follow-up evaluation of left foot pain.  Patient reports that she has no longer has pain at her left ankle especially with use of her supportive brace however did take a misstep out of bed 2 weeks ago and experienced increased pain to the ball of her foot states that the pain has slightly increased at the ball and is here for follow-up evaluation on this increased episode of pain.  Patient denies any redness warmth swelling or any other constitutional symptoms at this time.    Patient Active Problem List   Diagnosis Date Noted  . Seizure ([place]) 12/27/2017  . Frontal lobe epilepsy ([place]) 01/01/2014  . Complex partial epilepsy ([place]) 12/29/2013  . Localization-related (focal) (partial) epilepsy and epileptic syndromes with complex partial seizures, without mention of intractable epilepsy 05/09/2013  . Migraine 02/21/2013  . Right lower quadrant pain 02/21/2013  . Hypertension 02/21/2013  . Stress at home 02/21/2013  . Restless leg syndrome 12/28/2012  . 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 amLODipine (NORVASC) 5 MG tablet Take 1 tablet (5 mg total) by mouth daily. 30 tablet 0  . atorvastatin (LIPITOR) 10 MG tablet Take 10 mg by mouth at bedtime.     Marland Kitchen Desvenlafaxine Succinate ER 25 MG TB24 TAKE 1 TABLET EVERY MORNING WITH FOOD  1  . docusate sodium (COLACE) 100 MG capsule Take 1 capsule (100 mg total) by mouth at bedtime. 30 capsule 0  . lacosamide (VIMPAT) 200 MG TABS tablet Take 1 tablet (200 mg total) by mouth 2 (two) times daily. 60 tablet 5  . levothyroxine (SYNTHROID, LEVOTHROID) 25 MCG tablet Take 25 mcg by mouth daily before breakfast.     . meloxicam (MOBIC) 7.5 MG tablet TAKE 1 TABLET BY MOUTH TWICE A DAY WITH FOOD AS NEEDED FOR PAINS  5  . methocarbamol (ROBAXIN) 750 MG tablet Take 1 tablet (750 mg total)  by mouth 2 (two) times daily as needed for muscle spasms. 20 tablet 0  . methylPREDNISolone (MEDROL DOSEPAK) 4 MG TBPK tablet Take as directed 21 tablet 0  . Multiple Vitamins-Minerals (MULTIVITAMIN ADULT) TABS Take 1 tablet by mouth daily.    Marland Kitchen rOPINIRole (REQUIP) 1 MG tablet Take 1 tablet (1 mg total) by mouth daily. 30 tablet 0  . topiramate (TOPAMAX) 100 MG tablet Take 1 tablet (100 mg total) by mouth 2 (two) times daily. 60 tablet 12  . topiramate (TOPAMAX) 25 MG tablet Take 1 tablet (25 mg total) by mouth 2 (two) times daily. 60 tablet 12  . vitamin E 200 UNIT capsule Take 200 Units by mouth at bedtime.      No current facility-administered medications on file prior to visit.     Allergies  Allergen Reactions  . Peanut Oil Diarrhea    Nuts in general   . Other Diarrhea    Nuts and dark green vegetables cause diarrhea and dehydration  . Sulfa Antibiotics Rash    Red all over, itching    Objective:  General: Alert and oriented x3 in no acute distress  Dermatology: Old scar at left medial ankle from previous injury as a child, no open lesions bilateral lower extremities, no webspace macerations, no ecchymosis bilateral, all nails x 10 are well manicured.  Vascular: Dorsalis Pedis and Posterior Tibial pedal pulses palpable, Capillary Fill Time 3 seconds,(+) pedal hair growth bilateral,  varicosities bilateral lower extremities, Temperature gradient within normal limits.  Neurology: Johney Maine sensation intact via light touch bilateral  Musculoskeletal: Pain with palpation plantar forefoot at second and third interspace on left foot possible neuroma as previous that could be re-inflamed from misstep, there is no pain to palpation over the peroneal tendon course on the left.  No pain to palpation to calf bilateral.  Assessment and Plan: Problem List Items Addressed This Visit    None    Visit Diagnoses    Neuroma digital nerve    -  Primary   Relevant Medications   triamcinolone  acetonide (KENALOG) 10 MG/ML injection 10 mg (Start on 02/08/2018 11:00 PM)   Capsulitis       Relevant Medications   triamcinolone acetonide (KENALOG) 10 MG/ML injection 10 mg (Start on 02/08/2018 11:00 PM)   Left foot pain          -Complete examination performed -Discussed treatement options for likely capsulitis versus neuroma secondary to biomechanics and resolved ankle pain on left -After oral consent and aseptic prep, injected a mixture containing 1 ml of 2%  plain lidocaine, 1 ml 0.5% plain marcaine, 0.5 ml of kenalog 10 and 0.5 ml of dexamethasone phosphate into left second webspace without complication. Post-injection care discussed with patient.  -Advised good supportive shoes -Continue with plantar fascial brace on left ankle for at least 1 more months -Patient to return to office if pain is not better in 1 month or sooner if condition worsens.  Landis Martins, DPM

## 2018-03-21 ENCOUNTER — Other Ambulatory Visit: Payer: Self-pay | Admitting: Diagnostic Neuroimaging

## 2018-03-21 NOTE — Telephone Encounter (Signed)
[facility] Verified LR 03-01-18 Qty: 60 Pending appt- 07-06-2018

## 2018-05-31 ENCOUNTER — Other Ambulatory Visit: Payer: Self-pay | Admitting: Gastroenterology

## 2018-07-05 ENCOUNTER — Ambulatory Visit (HOSPITAL_COMMUNITY): Admit: 2018-07-05 | Payer: Medicare HMO | Admitting: Gastroenterology

## 2018-07-05 ENCOUNTER — Encounter (HOSPITAL_COMMUNITY): Payer: Self-pay

## 2018-07-05 SURGERY — COLONOSCOPY WITH PROPOFOL
Anesthesia: Monitor Anesthesia Care

## 2018-07-06 ENCOUNTER — Ambulatory Visit: Payer: Medicaid Other | Admitting: Diagnostic Neuroimaging

## 2018-08-01 ENCOUNTER — Other Ambulatory Visit: Payer: Self-pay | Admitting: Gastroenterology

## 2018-08-01 DIAGNOSIS — R634 Abnormal weight loss: Secondary | ICD-10-CM

## 2018-08-03 ENCOUNTER — Ambulatory Visit
Admission: RE | Admit: 2018-08-03 | Discharge: 2018-08-03 | Disposition: A | Discharge status: Home / Self Care | Payer: Medicare HMO | Source: Ambulatory Visit | Attending: Gastroenterology | Admitting: Gastroenterology

## 2018-08-03 ENCOUNTER — Other Ambulatory Visit: Payer: Self-pay

## 2018-08-03 DIAGNOSIS — R634 Abnormal weight loss: Secondary | ICD-10-CM

## 2018-08-03 MED ORDER — IOPAMIDOL (ISOVUE-300) INJECTION 61%
100.0000 mL | Freq: Once | INTRAVENOUS | Status: AC | PRN
  Administered 2018-08-03: 12:00:00 100 mL via INTRAVENOUS

## 2018-08-11 ENCOUNTER — Other Ambulatory Visit: Payer: Self-pay | Admitting: *Deleted

## 2018-08-11 MED ORDER — TOPIRAMATE 25 MG PO TABS: 25.0000 mg | ORAL_TABLET | Freq: Two times a day (BID) | ORAL | 1 refills | Status: DC

## 2018-08-11 MED ORDER — TOPIRAMATE 100 MG PO TABS: 100.0000 mg | ORAL_TABLET | Freq: Two times a day (BID) | ORAL | 1 refills | Status: DC

## 2018-08-11 MED ORDER — LACOSAMIDE 200 MG PO TABS: 200.0000 mg | ORAL_TABLET | Freq: Two times a day (BID) | ORAL | 5 refills | Status: DC

## 2018-08-11 NOTE — Telephone Encounter (Addendum)
Received fax from [facility] re: Vimpat refill. I called patient who stated she will be getting 90 day supply of meds by mail with [employer]. We scheduled her for a 1 year FU; I advised will send refill request to Dr Leta Baptist. She verbalized understanding, appreciation. Also received fax from [facility] requesting 90 day supply/refills on Topiramate 100 mg and 25 mg tabs. Esrcibed those refills. Called [facility], cancelled remaining refills on topiramate 100 mg and 25 mg tabs.

## 2018-08-15 ENCOUNTER — Telehealth: Payer: Self-pay | Admitting: *Deleted

## 2018-08-15 NOTE — Telephone Encounter (Signed)
Drug Registry checked.  Last fill vimpat 200mg  po bid 08-12-18.  Duplicate copy. Disregarded.

## 2018-09-28 ENCOUNTER — Other Ambulatory Visit: Payer: Self-pay | Admitting: Gastroenterology

## 2018-10-12 ENCOUNTER — Other Ambulatory Visit: Payer: Self-pay | Admitting: Gastroenterology

## 2018-10-13 ENCOUNTER — Other Ambulatory Visit: Payer: Self-pay

## 2018-10-13 ENCOUNTER — Encounter (HOSPITAL_COMMUNITY): Payer: Self-pay

## 2018-10-13 ENCOUNTER — Other Ambulatory Visit (HOSPITAL_COMMUNITY)
Admission: RE | Admit: 2018-10-13 | Discharge: 2018-10-13 | Disposition: A | Discharge status: Home / Self Care | Payer: Medicare HMO | Source: Ambulatory Visit | Attending: Gastroenterology | Admitting: Gastroenterology

## 2018-10-13 DIAGNOSIS — Z01812 Encounter for preprocedural laboratory examination: Secondary | ICD-10-CM | POA: Insufficient documentation

## 2018-10-13 DIAGNOSIS — Z1159 Encounter for screening for other viral diseases: Secondary | ICD-10-CM | POA: Insufficient documentation

## 2018-10-13 LAB — SARS CORONAVIRUS 2 (TAT 6-24 HRS): SARS Coronavirus 2: NEGATIVE

## 2018-10-13 NOTE — Progress Notes (Signed)
SPOKE W/  Teresa Nguyen     SCREENING SYMPTOMS OF COVID 19:   COUGH--NO  RUNNY NOSE--- NO  SORE THROAT---NO  NASAL CONGESTION----NO  SNEEZING----NO  SHORTNESS OF BREATH---NO  DIFFICULTY BREATHING---NO  TEMP >100.0 -----NO  UNEXPLAINED BODY ACHES------NO  CHILLS -------- NO  HEADACHES ---------NO  LOSS OF SMELL/ TASTE --------NO    HAVE YOU OR ANY FAMILY MEMBER TRAVELLED PAST 14 DAYS OUT OF THE   COUNTY---NO STATE----NO COUNTRY----NO  HAVE YOU OR ANY FAMILY MEMBER BEEN EXPOSED TO ANYONE WITH COVID 19? NO

## 2018-10-17 ENCOUNTER — Ambulatory Visit (HOSPITAL_COMMUNITY): Payer: Medicare HMO | Admitting: Anesthesiology

## 2018-10-17 ENCOUNTER — Encounter (HOSPITAL_COMMUNITY): Payer: Self-pay | Admitting: *Deleted

## 2018-10-17 ENCOUNTER — Ambulatory Visit: Payer: Medicaid Other | Admitting: Diagnostic Neuroimaging

## 2018-10-17 ENCOUNTER — Other Ambulatory Visit: Payer: Self-pay

## 2018-10-17 ENCOUNTER — Ambulatory Visit (HOSPITAL_COMMUNITY)
Admission: RE | Admit: 2018-10-17 | Discharge: 2018-10-17 | Disposition: A | Discharge status: Home / Self Care | Payer: Medicare HMO | Attending: Gastroenterology | Admitting: Gastroenterology

## 2018-10-17 ENCOUNTER — Encounter (HOSPITAL_COMMUNITY)
Admission: RE | Disposition: A | Discharge status: Home / Self Care | Payer: Self-pay | Source: Home / Self Care | Attending: Gastroenterology

## 2018-10-17 DIAGNOSIS — E039 Hypothyroidism, unspecified: Secondary | ICD-10-CM | POA: Diagnosis not present

## 2018-10-17 DIAGNOSIS — Z79899 Other long term (current) drug therapy: Secondary | ICD-10-CM | POA: Insufficient documentation

## 2018-10-17 DIAGNOSIS — K640 First degree hemorrhoids: Secondary | ICD-10-CM | POA: Diagnosis not present

## 2018-10-17 DIAGNOSIS — Z8249 Family history of ischemic heart disease and other diseases of the circulatory system: Secondary | ICD-10-CM | POA: Insufficient documentation

## 2018-10-17 DIAGNOSIS — K5900 Constipation, unspecified: Secondary | ICD-10-CM | POA: Insufficient documentation

## 2018-10-17 DIAGNOSIS — F329 Major depressive disorder, single episode, unspecified: Secondary | ICD-10-CM | POA: Diagnosis not present

## 2018-10-17 DIAGNOSIS — Z800 Family history of malignant neoplasm of digestive organs: Secondary | ICD-10-CM | POA: Diagnosis not present

## 2018-10-17 DIAGNOSIS — K317 Polyp of stomach and duodenum: Secondary | ICD-10-CM | POA: Insufficient documentation

## 2018-10-17 DIAGNOSIS — R51 Headache: Secondary | ICD-10-CM | POA: Diagnosis not present

## 2018-10-17 DIAGNOSIS — Z882 Allergy status to sulfonamides status: Secondary | ICD-10-CM | POA: Diagnosis not present

## 2018-10-17 DIAGNOSIS — M1990 Unspecified osteoarthritis, unspecified site: Secondary | ICD-10-CM | POA: Insufficient documentation

## 2018-10-17 DIAGNOSIS — D122 Benign neoplasm of ascending colon: Secondary | ICD-10-CM | POA: Insufficient documentation

## 2018-10-17 DIAGNOSIS — K5730 Diverticulosis of large intestine without perforation or abscess without bleeding: Secondary | ICD-10-CM | POA: Diagnosis not present

## 2018-10-17 DIAGNOSIS — K449 Diaphragmatic hernia without obstruction or gangrene: Secondary | ICD-10-CM | POA: Diagnosis not present

## 2018-10-17 DIAGNOSIS — D123 Benign neoplasm of transverse colon: Secondary | ICD-10-CM | POA: Insufficient documentation

## 2018-10-17 DIAGNOSIS — Z823 Family history of stroke: Secondary | ICD-10-CM | POA: Insufficient documentation

## 2018-10-17 DIAGNOSIS — R569 Unspecified convulsions: Secondary | ICD-10-CM | POA: Diagnosis not present

## 2018-10-17 DIAGNOSIS — K219 Gastro-esophageal reflux disease without esophagitis: Secondary | ICD-10-CM | POA: Diagnosis present

## 2018-10-17 DIAGNOSIS — F419 Anxiety disorder, unspecified: Secondary | ICD-10-CM | POA: Diagnosis not present

## 2018-10-17 DIAGNOSIS — Z8379 Family history of other diseases of the digestive system: Secondary | ICD-10-CM | POA: Diagnosis not present

## 2018-10-17 DIAGNOSIS — R195 Other fecal abnormalities: Secondary | ICD-10-CM | POA: Clinically undetermined

## 2018-10-17 DIAGNOSIS — R634 Abnormal weight loss: Secondary | ICD-10-CM | POA: Diagnosis present

## 2018-10-17 HISTORY — DX: Presence of dental prosthetic device (complete) (partial): Z97.2

## 2018-10-17 HISTORY — PX: POLYPECTOMY: SHX5525

## 2018-10-17 HISTORY — PX: ESOPHAGOGASTRODUODENOSCOPY (EGD) WITH PROPOFOL: SHX5813

## 2018-10-17 HISTORY — DX: Fracture of unspecified phalanx of unspecified finger, initial encounter for closed fracture: S62.609A

## 2018-10-17 HISTORY — DX: Major depressive disorder, single episode, unspecified: F32.9

## 2018-10-17 HISTORY — PX: BIOPSY: SHX5522

## 2018-10-17 HISTORY — DX: Anxiety disorder, unspecified: F41.9

## 2018-10-17 HISTORY — PX: COLONOSCOPY WITH PROPOFOL: SHX5780

## 2018-10-17 SURGERY — COLONOSCOPY WITH PROPOFOL
Anesthesia: Monitor Anesthesia Care

## 2018-10-17 MED ORDER — LACTATED RINGERS IV SOLN
INTRAVENOUS | Status: DC
  Administered 2018-10-17: 08:00:00 via INTRAVENOUS

## 2018-10-17 MED ORDER — PROPOFOL 500 MG/50ML IV EMUL
INTRAVENOUS | Status: DC | PRN
  Administered 2018-10-17: 150 ug/kg/min via INTRAVENOUS

## 2018-10-17 MED ORDER — SODIUM CHLORIDE 0.9 % IV SOLN: INTRAVENOUS | Status: DC

## 2018-10-17 MED ORDER — PROPOFOL 500 MG/50ML IV EMUL
INTRAVENOUS | Status: DC | PRN
  Administered 2018-10-17: 30 mg via INTRAVENOUS

## 2018-10-17 SURGICAL SUPPLY — 24 items

## 2018-10-17 NOTE — Interval H&P Note (Signed)
History and Physical Interval Note:  10/17/2018 9:47 AM  Teresa Nguyen  has presented today for surgery, with the diagnosis of Abdominal pain/Weight loss.  The various methods of treatment have been discussed with the patient and family. After consideration of risks, benefits and other options for treatment, the patient has consented to  Procedure(s): COLONOSCOPY WITH PROPOFOL (N/A) ESOPHAGOGASTRODUODENOSCOPY (EGD) WITH PROPOFOL (N/A) as a surgical intervention.  The patient's history has been reviewed, patient examined, no change in status, stable for surgery.  I have reviewed the patient's chart and labs.  Questions were answered to the patient's satisfaction.     [name]

## 2018-10-17 NOTE — Anesthesia Preprocedure Evaluation (Addendum)
Anesthesia Evaluation  Patient identified by MRN, date of birth, ID band Patient awake    Reviewed: Allergy & Precautions, NPO status , Patient's Chart, lab work & pertinent test results  Airway Mallampati: II  TM Distance: <3 FB Neck ROM: Full    Dental  (+) Dental Advisory Given, Partial Upper, Partial Lower   Pulmonary former smoker,    Pulmonary exam normal breath sounds clear to auscultation       Cardiovascular hypertension, Pt. on medications + DVT  Normal cardiovascular exam Rhythm:Regular Rate:Normal     Neuro/Psych  Headaches, Seizures - (since age 68-3, last sz 12/31/15), Well Controlled,  PSYCHIATRIC DISORDERS Anxiety Depression    GI/Hepatic Neg liver ROS, Abdominal pain/Weight loss      Endo/Other  Hypothyroidism   Renal/GU negative Renal ROS     Musculoskeletal  (+) Arthritis ,   Abdominal   Peds  Hematology negative hematology ROS (+)   Anesthesia Other Findings Day of surgery medications reviewed with the patient.  Reproductive/Obstetrics                            Anesthesia Physical Anesthesia Plan  ASA: III  Anesthesia Plan: MAC   Post-op Pain Management:    Induction: Intravenous  PONV Risk Score and Plan: 2 and Propofol infusion and Treatment may vary due to age or medical condition  Airway Management Planned: Nasal Cannula and Natural Airway  Additional Equipment:   Intra-op Plan:   Post-operative Plan:   Informed Consent: I have reviewed the patients History and Physical, chart, labs and discussed the procedure including the risks, benefits and alternatives for the proposed anesthesia with the patient or authorized representative who has indicated his/her understanding and acceptance.     Dental advisory given  Plan Discussed with: CRNA and Anesthesiologist  Anesthesia Plan Comments:         Anesthesia Quick Evaluation

## 2018-10-17 NOTE — Op Note (Signed)
[facility] Patient Name: Teresa Nguyen Procedure Date: 10/17/2018 MRN: [id number] Attending MD: [name] , MD Date of Birth: 01-04-1951 CSN: [id number] Age: 68 Admit Type: Outpatient Procedure:                Upper GI endoscopy Indications:              Esophageal reflux, Weight loss Providers:                [name], MD, Carlyn Reichert, RN, Truddie Coco, RN Referring MD:             Tarry Kos Avbuere MD Medicines:                Propofol per Anesthesia, Monitored Anesthesia Care Complications:            No immediate complications. Estimated Blood Loss:     Estimated blood loss: none. Procedure:                Pre-Anesthesia Assessment:                           - Prior to the procedure, a History and Physical                            was performed, and patient medications and                            allergies were reviewed. The patient's tolerance of                            previous anesthesia was also reviewed. The risks                            and benefits of the procedure and the sedation                            options and risks were discussed with the patient.                            All questions were answered, and informed consent                            was obtained. Prior Anticoagulants: The patient has                            taken no previous anticoagulant or antiplatelet                            agents. ASA Grade Assessment: III - A patient with                            severe systemic disease. After reviewing the risks  and benefits, the patient was deemed in                            satisfactory condition to undergo the procedure.                           After obtaining informed consent, the endoscope was                            passed under direct vision. Throughout the                            procedure, the patient's blood pressure, pulse, and                           oxygen saturations were monitored continuously. The                            [id number]-H190 ([id number]) Olympus gastroscope was                            introduced through the mouth, and advanced to the                            second part of duodenum. The upper GI endoscopy was                            accomplished without difficulty. The patient                            tolerated the procedure well. Scope In: 9:55:30 AM Scope Out: 10:02:36 AM Total Procedure Duration: 0 hours 7 minutes 6 seconds  Findings:      The examined esophagus was normal.      The Z-line was regular and was found 40 cm from the incisors.      A single 5 mm sessile polyp with no bleeding and no stigmata of recent       bleeding was found in the gastric fundus. The polyp was removed with a       hot snare. Resection and retrieval were complete. Estimated blood loss:       none.      A few small sessile polyps with no bleeding and no stigmata of recent       bleeding were found in the gastric fundus. Polypectomy was not attempted.      A medium-sized hiatal hernia was present.      The examined duodenum was normal. Impression:               - Normal esophagus.                           - Z-line regular, 40 cm from the incisors.                           - A single gastric polyp. Resected and retrieved.                           -  A few gastric polyps. Resection not attempted.                           - Medium-sized hiatal hernia.                           - Normal examined duodenum. Moderate Sedation:      Not Applicable - Patient had care per Anesthesia. Recommendation:           - Patient has a contact number available for                            emergencies. The signs and symptoms of potential                            delayed complications were discussed with the                            patient. Return to normal activities tomorrow.                            Written  discharge instructions were provided to the                            patient.                           - Await pathology results. Procedure Code(s):        --- Professional ---                           [phone], Esophagogastroduodenoscopy, flexible,                            transoral; with removal of tumor(s), polyp(s), or                            other lesion(s) by snare technique Diagnosis Code(s):        --- Professional ---                           K21.9, Gastro-esophageal reflux disease without                            esophagitis                           K31.7, Polyp of stomach and duodenum                           K44.9, Diaphragmatic hernia without obstruction or                            gangrene                           R63.4, Abnormal weight loss CPT copyright 2019 American Medical Association.  All rights reserved. The codes documented in this report are preliminary and upon coder review may  be revised to meet current compliance requirements. [name], MD 10/17/2018 10:43:13 AM This report has been signed electronically. Number of Addenda: 0

## 2018-10-17 NOTE — Anesthesia Postprocedure Evaluation (Signed)
Anesthesia Post Note  Patient: Teresa Nguyen  Procedure(s) Performed: COLONOSCOPY WITH PROPOFOL (N/A ) ESOPHAGOGASTRODUODENOSCOPY (EGD) WITH PROPOFOL (N/A ) POLYPECTOMY     Patient location during evaluation: Endoscopy Anesthesia Type: MAC Level of consciousness: awake and alert Pain management: pain level controlled Vital Signs Assessment: post-procedure vital signs reviewed and stable Respiratory status: spontaneous breathing, nonlabored ventilation, respiratory function stable and patient connected to nasal cannula oxygen Cardiovascular status: stable and blood pressure returned to baseline Postop Assessment: no apparent nausea or vomiting Anesthetic complications: no    Last Vitals:  Vitals:   10/17/18 1045 10/17/18 1053  BP: 101/62 105/62  Pulse: 67 68  Resp: (!) 28 17  Temp:    SpO2: 95% 99%    Last Pain:  Vitals:   10/17/18 1032  TempSrc: Oral  PainSc: 0-No pain                 Catalina Gravel

## 2018-10-17 NOTE — Transfer of Care (Signed)
Immediate Anesthesia Transfer of Care Note  Patient: Teresa Nguyen  Procedure(s) Performed: COLONOSCOPY WITH PROPOFOL (N/A ) ESOPHAGOGASTRODUODENOSCOPY (EGD) WITH PROPOFOL (N/A ) POLYPECTOMY  Patient Location: PACU  Anesthesia Type:MAC  Level of Consciousness: awake, alert  and oriented  Airway & Oxygen Therapy: Patient Spontanous Breathing and Patient connected to nasal cannula oxygen  Post-op Assessment: Report given to RN and Post -op Vital signs reviewed and stable  Post vital signs: Reviewed and stable  Last Vitals:  Vitals Value Taken Time  BP    Temp    Pulse    Resp    SpO2      Last Pain:  Vitals:   10/17/18 0820  TempSrc: Temporal  PainSc: 0-No pain         Complications: No apparent anesthesia complications

## 2018-10-17 NOTE — Op Note (Signed)
[facility] Patient Name: Teresa Nguyen Procedure Date: 10/17/2018 MRN: [id number] Attending MD: [name] , MD Date of Birth: 09-27-1950 CSN: [id number] Age: 68 Admit Type: Outpatient Procedure:                Colonoscopy Indications:              This is the patient's first colonoscopy, Change in                            bowel habits, Constipation, Weight loss Providers:                [name], MD, Carlyn Reichert, RN, Truddie Coco, RN Referring MD:             Tarry Kos Avbuere MD Medicines:                Propofol per Anesthesia, Monitored Anesthesia Care Complications:            No immediate complications. Estimated Blood Loss:     Estimated blood loss: none. Procedure:                Pre-Anesthesia Assessment:                           - Prior to the procedure, a History and Physical                            was performed, and patient medications and                            allergies were reviewed. The patient's tolerance of                            previous anesthesia was also reviewed. The risks                            and benefits of the procedure and the sedation                            options and risks were discussed with the patient.                            All questions were answered, and informed consent                            was obtained. Prior Anticoagulants: The patient has                            taken no previous anticoagulant or antiplatelet                            agents. ASA Grade Assessment: III - A patient with  severe systemic disease. After reviewing the risks                            and benefits, the patient was deemed in                            satisfactory condition to undergo the procedure.                           After obtaining informed consent, the colonoscope                            was passed under direct vision. Throughout the                             procedure, the patient's blood pressure, pulse, and                            oxygen saturations were monitored continuously. The                            [id number]-H190DL ([id number] Olympus pediatric colonscope                            was introduced through the anus and advanced to the                            the cecum, identified by appendiceal orifice and                            ileocecal valve. The colonoscopy was performed                            without difficulty. The patient tolerated the                            procedure well. The quality of the bowel                            preparation was adequate and good. The ileocecal                            valve, appendiceal orifice, and rectum were                            photographed. Scope In: 10:08:15 AM Scope Out: 10:30:34 AM Scope Withdrawal Time: 0 hours 17 minutes 56 seconds  Total Procedure Duration: 0 hours 22 minutes 19 seconds  Findings:      The perianal and digital rectal examinations were normal.      A 5 mm polyp was found in the ascending colon. The polyp was       semi-sessile. The polyp was removed with a hot snare. Resection and       retrieval were complete. Estimated blood loss: none.  A 5 mm polyp was found in the transverse colon. The polyp was       semi-sessile. The polyp was removed with a hot snare. Resection and       retrieval were complete. Estimated blood loss: none.      A few small-mouthed diverticula were found in the sigmoid colon.      Internal hemorrhoids were found during retroflexion. The hemorrhoids       were small and Grade I (internal hemorrhoids that do not prolapse). Impression:               - One 5 mm polyp in the ascending colon, removed                            with a hot snare. Resected and retrieved.                           - One 5 mm polyp in the transverse colon, removed                            with a hot snare. Resected and  retrieved.                           - Diverticulosis in the sigmoid colon.                           - Internal hemorrhoids. Moderate Sedation:      Not Applicable - Patient had care per Anesthesia. Recommendation:           - Patient has a contact number available for                            emergencies. The signs and symptoms of potential                            delayed complications were discussed with the                            patient. Return to normal activities tomorrow.                            Written discharge instructions were provided to the                            patient.                           - High fiber diet.                           - Await pathology results.                           - No aspirin, ibuprofen, naproxen, or other                            non-steroidal anti-inflammatory drugs for 2  weeks.                           - Repeat colonoscopy for surveillance based on                            pathology results. Procedure Code(s):        --- Professional ---                           [phone], Colonoscopy, flexible; with removal of                            tumor(s), polyp(s), or other lesion(s) by snare                            technique Diagnosis Code(s):        --- Professional ---                           R19.4, Change in bowel habit                           R63.4, Abnormal weight loss                           K63.5, Polyp of colon                           K64.0, First degree hemorrhoids                           K59.00, Constipation, unspecified                           K57.30, Diverticulosis of large intestine without                            perforation or abscess without bleeding CPT copyright 2019 American Medical Association. All rights reserved. The codes documented in this report are preliminary and upon coder review may  be revised to meet current compliance requirements. [name], MD 10/17/2018 10:47:40  AM This report has been signed electronically. Number of Addenda: 0

## 2018-10-17 NOTE — H&P (Signed)
Date of Initial H&P: 09/28/18  History reviewed, patient examined, no change in status, stable for surgery.

## 2018-10-17 NOTE — Discharge Instructions (Signed)

## 2018-10-18 ENCOUNTER — Encounter (HOSPITAL_COMMUNITY): Payer: Self-pay | Admitting: Gastroenterology

## 2018-10-31 ENCOUNTER — Telehealth: Payer: Self-pay | Admitting: Diagnostic Neuroimaging

## 2018-10-31 NOTE — Telephone Encounter (Addendum)
Spoke with Dr. Felecia Shelling, ok to provide samples for pt to pick up. Sample provided: Vimpat 200mg  #14 tabs x 2 boxes: Lot: [id number] Expiration: 12/2022 NDC: 0454-0981-19 and Lot: [id number] Expiration: 08/2022 NDC: 5621-3086-57. Provided directions pt info for medication.  I called pt and relayed we have samples. She is waiting on shipment from [facility] to come. She called and it is in transit but unsure when it will arrive. She only has a couple days of medication left.  She will come by today to pick up. Advised her to wear mask and her temp will be checked when she comes. She verbalized understanding.

## 2018-10-31 NOTE — Telephone Encounter (Signed)
Pt called stating that she has not received her lacosamide (VIMPAT) 200 MG TABS tablet yet and would like to know if she can get samples if available to cover her until the shipment came in. Please advise.

## 2018-12-20 ENCOUNTER — Other Ambulatory Visit: Payer: Self-pay | Admitting: Diagnostic Neuroimaging

## 2018-12-20 MED ORDER — LACOSAMIDE 200 MG PO TABS: 200.0000 mg | ORAL_TABLET | Freq: Two times a day (BID) | ORAL | 5 refills | Status: DC

## 2018-12-20 NOTE — Telephone Encounter (Signed)
Please call in refill vimpat to [place], [place] .  [employer] ID # [id number]

## 2018-12-20 NOTE — Addendum Note (Signed)
Addended by: Florian Buff C on: 12/20/2018 11:35 AM   Modules accepted: Orders

## 2018-12-27 NOTE — Progress Notes (Signed)
PATIENT: Teresa Nguyen DOB: 09-06-1950  REASON FOR VISIT: follow up HISTORY FROM: patient  Chief Complaint  Patient presents with   Follow-up    [facility] , Seizure f/u "Had 1 seizure 73mo ago. No concerns"     HISTORY OF PRESENT ILLNESS: Today 12/28/18 Teresa Nguyen is a 68 y.o. female here today for follow up for seizure disorder and RLS. She continues topiramate 125mg  twice daily and lacosamide 200mg  twice daily. She was seen by [facility] for evaluation of non epileptic events. No changes were made to regimen as she had no further events after increasing topiramate back to 125mg  twice daily. She reports that she had one event about 3 months ago. She was lying in beg and felt it coming on. She went to take a Valium but event was over before she could get to her medications. She denies tonic clonic movements. She denies incontinence or tongue injury. Event was very brief and she had been under significant stress that day. She continues ropinirole 1mg  at bedtime and feels it helps. She is seeing psychiatry regularly. She was last seen about a month ago. She does not drive.   HISTORY: (copied from Dr Gladstone Lighter note on 12/29/2017)  UPDATE (12/29/17, VRP): Since last visit, doing well until 12/21/17 and 12/27/17 --> chest muscle spasm, difficulty talking, but no LOC. Each attack lasted 10-20 seconds, 30-50 attacks. Went to ER on 12/21/17. Then again to ER in 12/27/17. Had been taking CBZ for 3 months prior, but felt like it was triggering seizures, so she stopped in early Sept 2019. Mood is stable.   UPDATE (10/11/17, VRP): Since last visit, doing well until 1-2 weeks ago, woke up in night with chest muscle spasm; lasted 30 minutes. Then again over next 2 nights, 30-45 minutes. Also last night with similar events. No LOC or altered consciousness.  No aphasia. Tolerating meds. Having more GI issues (diarrhea) in last few months.   UPDATE (03/17/17, VRP): Since last visit, doing well. Tolerating  meds. No alleviating or aggravating factors. No seizures. Has seen Dr. Noemi Chapel (psychiatry) and was started on Equetro (carbamazepine ER samples).   UPDATE 08/25/16: Since last visit, no seizures. Had some "aura" sensations which consistent of "pulse" in the head sensation, esp with stress. Tolerating meds.   UPDATE 12/31/15: Last night forgot vimpat dose; this morning at 4am, woke up, had to urinate, stood up and then fell back on to bed, may have had brief LOC. Thinks she had a seizure. No post-ictal confusion.  No tongue biting. Restless legs stable with medication.   UPDATE 12/31/14: Since last visit, doing well, no seizures. Used valium 10mg  x 1 time in the last year for a seizure aura. Tolerating vimpat 200mg  BID + TPX 100mg  BID. Went to ER in Feb for syncope (dx with dehydration).   UPDATE 12/29/13 (LL): Since last visit, went to [facility] to have EMU monitoring. EMU report --> June 15-18, 2015: "Long-term video EEG monitoring captured multiple events; including electrographic frontal hypermotor seizures. She also exhibited episodes of oral dyskinetic activity which is not seizure (no EEG correlate)." It was concluded that her typical arousal events are consistent with frontal lobe epilepsy. Home medications Vimpat 200 mg twice a day,Topirimate 100 mg twice a day were resumed, but they recommended reducing home dose of Valium as she was taking 10-20 mg when necessary which likely to affects her affect and impairs her communication. She has had several close family members who have died in  the last year, including her mother and her sister who died suddenly. Patient is very content on her current medication regimen, stating that she has felt better on Vimpat than ever before. She has not had many nocturnal seizures since starting Vimpat, and states that she was afraid to go to sleep prior to starting it. Her past AEDs have been phenobarbital, phenytoin, Lacosamide, lamotrigine, levetiracetam,  Topirimate, valproic acid and zonisamide. Known seizure triggers are eating broccoli or eggplant. RLS symptoms are controlled on Ropinirole, although some nights she must take an extra 0.25 of a tablet to be comfortable.  UPDATE 12/28/12: Outside neurologist records received and reviewed. 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 Since last visit, no more seizures or spells. Her RLS is getting worse, b/c she is running out of ropinirole and was reducing the dose. Still with high stress. Not interested in seeing psychiatry.   UPDATE 06/10/12: Since last visit, sz stopped, until Dec 2013. She was preparing for colonoscopy, and anticipated having diarrhea from bowel prep. Before she took the bowel prep meds, she started with diarrhea. Soon after she had a seizure (no LOC, eye up, head turned, mouth twitching; no generalized convulsions. she could hear and move, but not talk). She took a valium, and her spell stopped. I still haven't received records from prior neurologist. Patient tells me that she was dx'd with "partial non-epileptic seizures". She was told to take valium prn to stop her seizures at home, in order to avoid freq ER trips.   PRIOR HPI (12/04/11): 68 year old right-handed female here for evaluation of seizure disorder. At age 45 years old, patient fell down a flight of stairs. Soon after she began to have "petit mall seizures". Patient states she was having inability to speak, convulsions and tongue biting. She started on Dilantin and phenobarbital early in life. Over the years she tried "many different seizure medications". She was living in [place] for most of her life and followed by neurology there. She had extensive testing with EEG, video EEG, sleep study, MRI, without any significant abnormalities. Unclear if any of these seizures were captured during EEG recordings. It sounds like no epileptiform discharges were ever noted. Unfortunately I do not have any  of her prior records to review myself for this visit. Patient has been on Vimpat and topiramate over the past year, and now has one episode every 3 weeks of "seizure". Current episodes consist of gagging sensation, inability to speak, without loss of consciousness tongue biting or convulsions.    REVIEW OF SYSTEMS: Out of a complete 14 system review of symptoms, the patient complains only of the following symptoms, headaches, seizure and speech difficulty and all other reviewed systems are negative.   ALLERGIES: Allergies  Allergen Reactions   Peanut Oil Diarrhea    Nuts in general    Other Diarrhea    Nuts and dark green vegetables cause diarrhea and dehydration   Sulfa Antibiotics Itching and Rash    Red all over    HOME MEDICATIONS: Outpatient Medications Prior to Visit  Medication Sig Dispense Refill   acetaminophen (TYLENOL) 500 MG tablet Take 500 mg by mouth 2 (two) times daily as needed for moderate pain or headache.     amLODipine (NORVASC) 5 MG tablet Take 1 tablet (5 mg total) by mouth daily. 30 tablet 0   atorvastatin (LIPITOR) 10 MG tablet Take 10 mg by mouth at bedtime.      Calcium Carbonate-Vitamin D (CALCIUM-D PO) Take  2 tablets by mouth daily.      Carboxymethylcellulose Sodium (REFRESH LIQUIGEL OP) Place 1 drop into both eyes daily as needed (dry eyes).     Desvenlafaxine Succinate ER 25 MG TB24 Take 25 mg by mouth daily.   1   diazepam (VALIUM) 10 MG tablet Take 10 mg by mouth daily as needed (seizure).     diclofenac sodium (VOLTAREN) 1 % GEL Apply 1 application topically 4 (four) times daily as needed for pain.     Digestive Enzyme CAPS Take 1 capsule by mouth daily as needed (eating something that may have dark green veggies).     fluticasone (FLONASE) 50 MCG/ACT nasal spray Place 2 sprays into both nostrils at bedtime.     levothyroxine (SYNTHROID, LEVOTHROID) 25 MCG tablet Take 25 mcg by mouth daily before breakfast.      loperamide (IMODIUM A-D)  2 MG tablet Take 1 mg by mouth 4 (four) times daily as needed for diarrhea or loose stools.     methocarbamol (ROBAXIN) 750 MG tablet Take 1 tablet (750 mg total) by mouth 2 (two) times daily as needed for muscle spasms. (Patient taking differently: Take 750 mg by mouth at bedtime. ) 20 tablet 0   Multiple Vitamins-Minerals (MULTIVITAMIN ADULT) TABS Take 1 tablet by mouth daily.     polyethylene glycol (MIRALAX / GLYCOLAX) packet Take 4.25 g by mouth daily.      rOPINIRole (REQUIP) 1 MG tablet Take 1 tablet (1 mg total) by mouth daily. 30 tablet 0   sodium chloride (OCEAN) 0.65 % SOLN nasal spray Place 1 spray into both nostrils as needed for congestion.     vitamin E 400 UNIT capsule Take 400 Units by mouth at bedtime.     lacosamide (VIMPAT) 200 MG TABS tablet Take 1 tablet (200 mg total) by mouth 2 (two) times daily. 60 tablet 5   topiramate (TOPAMAX) 100 MG tablet Take 1 tablet (100 mg total) by mouth 2 (two) times daily. (Patient taking differently: Take 100 mg by mouth 2 (two) times daily. Take with 25 mg to equal 125 twice daily) 180 tablet 1   topiramate (TOPAMAX) 25 MG tablet Take 1 tablet (25 mg total) by mouth 2 (two) times daily. (Patient taking differently: Take 25 mg by mouth 2 (two) times daily. Take with 100 mg to equal 125 twice daily) 180 tablet 1   No facility-administered medications prior to visit.     PAST MEDICAL HISTORY: Past Medical History:  Diagnosis Date   Anxiety    Arthritis    DVT (deep venous thrombosis) ([place]) 2008   L leg   Finger fracture, right    small   Major depressive disorder    pt denies   Migraine    Seizure disorder [facility])    since age 63-3, last sz 12/31/15   Thyroid disease    Wears partial dentures     PAST SURGICAL HISTORY: Past Surgical History:  Procedure Laterality Date   BIOPSY  10/17/2018   Procedure: BIOPSY;  Surgeon: Wilford Corner, MD;  Location: [facility];  Service: Endoscopy;;   BREAST LUMPECTOMY  Bilateral 1990   COLONOSCOPY WITH PROPOFOL N/A 10/17/2018   Procedure: COLONOSCOPY WITH PROPOFOL;  Surgeon: Wilford Corner, MD;  Location: [facility];  Service: Endoscopy;  Laterality: N/A;   ESOPHAGOGASTRODUODENOSCOPY (EGD) WITH PROPOFOL N/A 10/17/2018   Procedure: ESOPHAGOGASTRODUODENOSCOPY (EGD) WITH PROPOFOL;  Surgeon: Wilford Corner, MD;  Location: [facility];  Service: Endoscopy;  Laterality: N/A;   NASAL SINUS  SURGERY     POLYPECTOMY  10/17/2018   Procedure: POLYPECTOMY;  Surgeon: Wilford Corner, MD;  Location: [facility];  Service: Endoscopy;;   TONSILLECTOMY     TUBAL LIGATION      FAMILY HISTORY: Family History  Problem Relation Age of Onset   Stroke Father    Heart attack Father    Throat cancer Sister    Seizures Sister    Migraines Sister    Diabetes Maternal Grandmother    Diabetes Maternal Grandfather    Kidney Stones Sister     SOCIAL HISTORY: Social History   Socioeconomic History   Marital status: Divorced    Spouse name: Not on file   Number of children: 2   Years of education: 12th   Highest education level: Not on file  Occupational History   Occupation: disabled  Social Designer, fashion/clothing strain: Not on file   Food insecurity    Worry: Not on file    Inability: Not on file   Transportation needs    Medical: Not on file    Non-medical: Not on file  Tobacco Use   Smoking status: Former Smoker    Quit date: 04/16/1998    Years since quitting: 20.7   Smokeless tobacco: Never Used  Substance and Sexual Activity   Alcohol use: Yes    Comment: occ. social   Drug use: Not Currently   Sexual activity: Not Currently    Birth control/protection: Post-menopausal  Lifestyle   Physical activity    Days per week: Not on file    Minutes per session: Not on file   Stress: Not on file  Relationships   Social connections    Talks on phone: Not on file    Gets together: Not on file    Attends religious  service: Not on file    Active member of club or organization: Not on file    Attends meetings of clubs or organizations: Not on file    Relationship status: Not on file   Intimate partner violence    Fear of current or ex partner: Not on file    Emotionally abused: Not on file    Physically abused: Not on file    Forced sexual activity: Not on file  Other Topics Concern   Not on file  Social History Narrative   Patient lives at home alone.   Caffeine Use: 1 cup of coffee daily    PHYSICAL EXAM  Vitals:   12/28/18 1417  BP: 130/70  Pulse: (!) 54  Temp: (!) 97.5 F (36.4 C)  Weight: 158 lb (71.7 kg)  Height: 5\' 3"  (1.6 m)   Body mass index is 27.99 kg/m.  Generalized: Well developed, in no acute distress  Neurological examination  Mentation: Alert oriented to time, place, history taking. Follows all commands speech and language fluent Cranial nerve II-XII: Pupils were equal round reactive to light. Extraocular movements were full, visual field were full on confrontational test. Facial sensation and strength were normal. Uvula tongue midline. Head turning and shoulder shrug  were normal and symmetric. Motor: The motor testing reveals 5 over 5 strength of all 4 extremities. Good symmetric motor tone is noted throughout.  Sensory: Sensory testing is intact to soft touch on all 4 extremities. No evidence of extinction is noted.  Coordination: Cerebellar testing reveals good finger-nose-finger and heel-to-shin bilaterally.  Gait and station: Gait is normal.   DIAGNOSTIC DATA (LABS, IMAGING, TESTING) - I reviewed patient records, labs,  notes, testing and imaging myself where available.  No flowsheet data found.   Lab Results  Component Value Date   WBC 5.4 12/28/2017   HGB 13.2 12/28/2017   HCT 41.1 12/28/2017   MCV 93.8 12/28/2017   PLT 164 12/28/2017      Component Value Date/Time   NA 144 12/27/2017 1746   K 3.9 12/27/2017 1746   CL 110 12/27/2017 1746   CO2 27  12/27/2017 1746   GLUCOSE 111 (H) 12/27/2017 1746   BUN 24 (H) 12/27/2017 1746   CREATININE 0.77 12/28/2017 0223   CALCIUM 9.3 12/27/2017 1746   PROT 7.5 12/27/2017 1746   ALBUMIN 4.4 12/27/2017 1746   AST 20 12/27/2017 1746   ALT 15 12/27/2017 1746   ALKPHOS 90 12/27/2017 1746   BILITOT 0.5 12/27/2017 1746   GFRNONAA >60 12/28/2017 0223   GFRAA >60 12/28/2017 0223   No results found for: CHOL, HDL, LDLCALC, LDLDIRECT, TRIG, CHOLHDL No results found for: HGBA1C No results found for: VITAMINB12 No results found for: TSH    ASSESSMENT AND PLAN 68 y.o. year old female  has a past medical history of Anxiety, Arthritis, DVT (deep venous thrombosis) ([place]) (2008), Finger fracture, right, Major depressive disorder, Migraine, Seizure disorder ([place]), Thyroid disease, and Wears partial dentures. here with     ICD-10-CM   1. Partial symptomatic epilepsy with complex partial seizures, intractable, without status epilepticus ([place])  G40.219   2. Restless leg syndrome  G25.81   3. Nonepileptic episode ([place])  R56.9     Breashia continues to do well on current treatment regimen. Nonepileptic events have significantly decreased. She is tolerating medications. We will continue topiramate 125mg  twice daily and Vimpat 200mg  twice daily. She will also continue ropinirole for RLS. I have advised close follow up with psychiatry and PCP. She will follow up in 6 months, then anticipate yearly follow up if appropriate. She verbalizes understanding and agreement with this plan.    No orders of the defined types were placed in this encounter.    Meds ordered this encounter  Medications   topiramate (TOPAMAX) 100 MG tablet    Sig: Take 1 tablet (100 mg total) by mouth 2 (two) times daily.    Dispense:  180 tablet    Refill:  1    Order Specific Question:   Supervising Provider    Answer:   Melvenia Beam [id number]   topiramate (TOPAMAX) 25 MG tablet    Sig: Take 1 tablet (25 mg total) by mouth 2 (two)  times daily.    Dispense:  180 tablet    Refill:  1    Order Specific Question:   Supervising Provider    Answer:   Melvenia Beam [id number]   lacosamide (VIMPAT) 200 MG TABS tablet    Sig: Take 1 tablet (200 mg total) by mouth 2 (two) times daily.    Dispense:  60 tablet    Refill:  5    This request is for a new prescription for a controlled substance as required by Federal/State law.    Order Specific Question:   Supervising Provider    Answer:   Melvenia Beam [id number]      I spent 15 minutes with the patient. 50% of this time was spent counseling and educating patient on plan of care and medications.    Debbora Presto, FNP-C 12/28/2018, 3:05 PM [facility] [address] [place], [place] [address] [phone]

## 2018-12-27 NOTE — Patient Instructions (Addendum)
Continue topiramate 125mg  twice daily as well as lacosamide 200mg  twice daily.   Follow up in 6 months, sooner if needed   Seizure, Adult A seizure is a sudden burst of abnormal electrical activity in the brain. Seizures usually last from 30 seconds to 2 minutes. They can cause many different symptoms. Usually, seizures are not harmful unless they last a long time. What are the causes? Common causes of this condition include:  Fever or infection.  Conditions that affect the brain, such as: ? A brain abnormality that you were born with. ? A brain or head injury. ? Bleeding in the brain. ? A tumor. ? Stroke. ? Brain disorders such as autism or cerebral palsy.  Low blood sugar.  Conditions that are passed from parent to child (are inherited).  Problems with substances, such as: ? Having a reaction to a drug or a medicine. ? Suddenly stopping the use of a substance (withdrawal). In some cases, the cause may not be known. A person who has repeated seizures over time without a clear cause has a condition called epilepsy. What increases the risk? You are more likely to get this condition if you have:  A family history of epilepsy.  Had a seizure in the past.  A brain disorder.  A history of head injury, lack of oxygen at birth, or strokes. What are the signs or symptoms? There are many types of seizures. The symptoms vary depending on the type of seizure you have. Examples of symptoms during a seizure include:  Shaking (convulsions).  Stiffness in the body.  Passing out (losing consciousness).  Head nodding.  Staring.  Not responding to sound or touch.  Loss of bladder control and bowel control. Some people have symptoms right before and right after a seizure happens. Symptoms before a seizure may include:  Fear.  Worry (anxiety).  Feeling like you may vomit (nauseous).  Feeling like the room is spinning (vertigo).  Feeling like you saw or heard something  before (dj vu).  Odd tastes or smells.  Changes in how you see. You may see flashing lights or spots. Symptoms after a seizure happens can include:  Confusion.  Sleepiness.  Headache.  Weakness on one side of the body. How is this treated? Most seizures will stop on their own in under 5 minutes. In these cases, no treatment is needed. Seizures that last longer than 5 minutes will usually need treatment. Treatment can include:  Medicines given through an IV tube.  Avoiding things that are known to cause your seizures. These can include medicines that you take for another condition.  Medicines to treat epilepsy.  Surgery to stop the seizures. This may be needed if medicines do not help. Follow these instructions at home: Medicines  Take over-the-counter and prescription medicines only as told by your doctor.  Do not eat or drink anything that may keep your medicine from working, such as alcohol. Activity  Do not do any activities that would be dangerous if you had another seizure, like driving or swimming. Wait until your doctor says it is safe for you to do them.  If you live in the [place] ask your local DMV (department of motor vehicles) when you can drive.  Get plenty of rest. Teaching others Teach friends and family what to do when you have a seizure. They should:  Lay you on the ground.  Protect your head and body.  Loosen any tight clothing around your neck.  Turn you on your  side.  Not hold you down.  Not put anything into your mouth.  Know whether or not you need emergency care.  Stay with you until you are better.  General instructions  Contact your doctor each time you have a seizure.  Avoid anything that gives you seizures.  Keep a seizure diary. Write down: ? What you think caused each seizure. ? What you remember about each seizure.  Keep all follow-up visits as told by your doctor. This is important. Contact a doctor if:  You have  another seizure.  You have seizures more often.  There is any change in what happens during your seizures.  You keep having seizures with treatment.  You have symptoms of being sick or having an infection. Get help right away if:  You have a seizure that: ? Lasts longer than 5 minutes. ? Is different than seizures you had before. ? Makes it harder to breathe. ? Happens after you hurt your head.  You have any of these symptoms after a seizure: ? Not being able to speak. ? Not being able to use a part of your body. ? Confusion. ? A bad headache.  You have two or more seizures in a row.  You do not wake up right after a seizure.  You get hurt during a seizure. These symptoms may be an emergency. Do not wait to see if the symptoms will go away. Get medical help right away. Call your local emergency services (911 in the [place] Do not drive yourself to the hospital. Summary  Seizures usually last from 30 seconds to 2 minutes. Usually, they are not harmful unless they last a long time.  Do not eat or drink anything that may keep your medicine from working, such as alcohol.  Teach friends and family what to do when you have a seizure.  Contact your doctor each time you have a seizure. This information is not intended to replace advice given to you by your health care provider. Make sure you discuss any questions you have with your health care provider. Document Released: 09/16/2007 Document Revised: 06/17/2018 Document Reviewed: 06/17/2018 Elsevier Patient Education  [place].

## 2018-12-28 ENCOUNTER — Encounter: Payer: Self-pay | Admitting: Family Medicine

## 2018-12-28 ENCOUNTER — Ambulatory Visit: Payer: Self-pay | Admitting: Diagnostic Neuroimaging

## 2018-12-28 ENCOUNTER — Ambulatory Visit (INDEPENDENT_AMBULATORY_CARE_PROVIDER_SITE_OTHER): Payer: Medicare HMO | Admitting: Family Medicine

## 2018-12-28 ENCOUNTER — Other Ambulatory Visit: Payer: Self-pay

## 2018-12-28 VITALS — BP 130/70 | HR 54 | Temp 97.5°F | Ht 63.0 in | Wt 158.0 lb

## 2018-12-28 DIAGNOSIS — G2581 Restless legs syndrome: Secondary | ICD-10-CM | POA: Diagnosis not present

## 2018-12-28 DIAGNOSIS — R569 Unspecified convulsions: Secondary | ICD-10-CM

## 2018-12-28 DIAGNOSIS — G40219 Localization-related (focal) (partial) symptomatic epilepsy and epileptic syndromes with complex partial seizures, intractable, without status epilepticus: Secondary | ICD-10-CM

## 2018-12-28 MED ORDER — TOPIRAMATE 100 MG PO TABS: 100.0000 mg | ORAL_TABLET | Freq: Two times a day (BID) | ORAL | 1 refills | Status: DC

## 2018-12-28 MED ORDER — LACOSAMIDE 200 MG PO TABS: 200.0000 mg | ORAL_TABLET | Freq: Two times a day (BID) | ORAL | 5 refills | Status: DC

## 2018-12-28 MED ORDER — TOPIRAMATE 25 MG PO TABS: 25.0000 mg | ORAL_TABLET | Freq: Two times a day (BID) | ORAL | 1 refills | Status: DC

## 2019-01-26 ENCOUNTER — Other Ambulatory Visit: Payer: Self-pay

## 2019-01-26 ENCOUNTER — Ambulatory Visit (INDEPENDENT_AMBULATORY_CARE_PROVIDER_SITE_OTHER): Payer: Medicare HMO | Admitting: Nurse Practitioner

## 2019-01-26 ENCOUNTER — Encounter: Payer: Self-pay | Admitting: Nurse Practitioner

## 2019-01-26 VITALS — BP 117/80 | HR 75 | Wt 158.0 lb

## 2019-01-26 DIAGNOSIS — Z01419 Encounter for gynecological examination (general) (routine) without abnormal findings: Secondary | ICD-10-CM | POA: Diagnosis not present

## 2019-01-26 NOTE — Patient Instructions (Signed)

## 2019-01-26 NOTE — Progress Notes (Signed)
GYNECOLOGY ANNUAL PREVENTATIVE CARE ENCOUNTER NOTE  Subjective:   Teresa Nguyen is a 68 y.o. [phone] female here for a routine annual gynecologic exam.  Current complaints: No problems.  Has intentionally lost weight - 30 pounds primarily by walking daily. Currently has a PCP and a neurologist which she is seeing for seizure activity that has recurred since she changed seizure medication.  Does not drive.  Comes today for Pap smear but did not realize that with no previous abnormal pap smears and at age 39, a pap smear is not indicated.  Has been having so pain with one nipple that she has attributed to a new bra.   Denies abnormal vaginal bleeding, discharge, pelvic pain, problems with intercourse or other gynecologic concerns.    Gynecologic History No LMP recorded. Patient is postmenopausal. Contraception: none Last Pap: 2014. Results were: normal Last mammogram: 2019. Results were: normal.  Is scheduled for next mammogram in January 2021.  Obstetric History OB History  Gravida Para Term Preterm AB Living  4       1 3   SAB TAB Ectopic Multiple Live Births          4    # Outcome Date GA Lbr Len/2nd Weight Sex Delivery Anes PTL Lv  4 Gravida           3 Gravida           2 Gravida           1 AB             Past Medical History:  Diagnosis Date  . Anxiety   . Arthritis   . DVT (deep venous thrombosis) ([place]) 2008   L leg  . Finger fracture, right    small  . Major depressive disorder    pt denies  . Migraine   . Seizure disorder [facility])    since age 47-3, last sz 12/31/15  . Thyroid disease   . Wears partial dentures     Past Surgical History:  Procedure Laterality Date  . BIOPSY  10/17/2018   Procedure: BIOPSY;  Surgeon: Wilford Corner, MD;  Location: [facility];  Service: Endoscopy;;  . BREAST LUMPECTOMY Bilateral 1990  . COLONOSCOPY WITH PROPOFOL N/A 10/17/2018   Procedure: COLONOSCOPY WITH PROPOFOL;  Surgeon: Wilford Corner, MD;  Location: [facility];   Service: Endoscopy;  Laterality: N/A;  . ESOPHAGOGASTRODUODENOSCOPY (EGD) WITH PROPOFOL N/A 10/17/2018   Procedure: ESOPHAGOGASTRODUODENOSCOPY (EGD) WITH PROPOFOL;  Surgeon: Wilford Corner, MD;  Location: [facility];  Service: Endoscopy;  Laterality: N/A;  . NASAL SINUS SURGERY    . POLYPECTOMY  10/17/2018   Procedure: POLYPECTOMY;  Surgeon: Wilford Corner, MD;  Location: [facility];  Service: Endoscopy;;  . TONSILLECTOMY    . TUBAL LIGATION      Current Outpatient Medications on File Prior to Visit  Medication Sig Dispense Refill  . acetaminophen (TYLENOL) 500 MG tablet Take 500 mg by mouth 2 (two) times daily as needed for moderate pain or headache.    Marland Kitchen amLODipine (NORVASC) 5 MG tablet Take 1 tablet (5 mg total) by mouth daily. 30 tablet 0  . atorvastatin (LIPITOR) 10 MG tablet Take 10 mg by mouth at bedtime.     . Calcium Carbonate-Vitamin D (CALCIUM-D PO) Take 2 tablets by mouth daily.     Marland Kitchen levothyroxine (SYNTHROID, LEVOTHROID) 25 MCG tablet Take 25 mcg by mouth daily before breakfast.     . methocarbamol (ROBAXIN) 750 MG tablet Take 1  tablet (750 mg total) by mouth 2 (two) times daily as needed for muscle spasms. (Patient taking differently: Take 750 mg by mouth at bedtime. ) 20 tablet 0  . Multiple Vitamins-Minerals (MULTIVITAMIN ADULT) TABS Take 1 tablet by mouth daily.    Marland Kitchen topiramate (TOPAMAX) 100 MG tablet Take 1 tablet (100 mg total) by mouth 2 (two) times daily. 180 tablet 1  . topiramate (TOPAMAX) 25 MG tablet Take 1 tablet (25 mg total) by mouth 2 (two) times daily. 180 tablet 1  . vitamin E 400 UNIT capsule Take 400 Units by mouth at bedtime.    . Carboxymethylcellulose Sodium (REFRESH LIQUIGEL OP) Place 1 drop into both eyes daily as needed (dry eyes).    . Desvenlafaxine Succinate ER 25 MG TB24 Take 25 mg by mouth daily.   1  . diazepam (VALIUM) 10 MG tablet Take 10 mg by mouth daily as needed (seizure).    . diclofenac sodium (VOLTAREN) 1 % GEL Apply 1 application  topically 4 (four) times daily as needed for pain.    . Digestive Enzyme CAPS Take 1 capsule by mouth daily as needed (eating something that may have dark green veggies).    . fluticasone (FLONASE) 50 MCG/ACT nasal spray Place 2 sprays into both nostrils at bedtime.    Marland Kitchen lacosamide (VIMPAT) 200 MG TABS tablet Take 1 tablet (200 mg total) by mouth 2 (two) times daily. 60 tablet 5  . loperamide (IMODIUM A-D) 2 MG tablet Take 1 mg by mouth 4 (four) times daily as needed for diarrhea or loose stools.    . polyethylene glycol (MIRALAX / GLYCOLAX) packet Take 4.25 g by mouth daily.     Marland Kitchen rOPINIRole (REQUIP) 1 MG tablet Take 1 tablet (1 mg total) by mouth daily. 30 tablet 0  . sodium chloride (OCEAN) 0.65 % SOLN nasal spray Place 1 spray into both nostrils as needed for congestion.     No current facility-administered medications on file prior to visit.     Allergies  Allergen Reactions  . Peanut Oil Diarrhea    Nuts in general   . Other Diarrhea    Nuts and dark green vegetables cause diarrhea and dehydration  . Sulfa Antibiotics Itching and Rash    Red all over    Social History   Socioeconomic History  . Marital status: Divorced    Spouse name: Not on file  . Number of children: 2  . Years of education: 12th  . Highest education level: Not on file  Occupational History  . Occupation: disabled  Social Needs  . Financial resource strain: Not on file  . Food insecurity    Worry: Not on file    Inability: Not on file  . Transportation needs    Medical: Not on file    Non-medical: Not on file  Tobacco Use  . Smoking status: Former Smoker    Quit date: 04/16/1998    Years since quitting: 20.7  . Smokeless tobacco: Never Used  Substance and Sexual Activity  . Alcohol use: Yes    Comment: occ. social  . Drug use: Not Currently  . Sexual activity: Not Currently    Birth control/protection: Post-menopausal  Lifestyle  . Physical activity    Days per week: Not on file    Minutes  per session: Not on file  . Stress: Not on file  Relationships  . Social Herbalist on phone: Not on file    Gets together: Not  on file    Attends religious service: Not on file    Active member of club or organization: Not on file    Attends meetings of clubs or organizations: Not on file    Relationship status: Not on file  . Intimate partner violence    Fear of current or ex partner: Not on file    Emotionally abused: Not on file    Physically abused: Not on file    Forced sexual activity: Not on file  Other Topics Concern  . Not on file  Social History Narrative   Patient lives at home alone.   Caffeine Use: 1 cup of coffee daily    Family History  Problem Relation Age of Onset  . Stroke Father   . Heart attack Father   . Throat cancer Sister   . Seizures Sister   . Migraines Sister   . Diabetes Maternal Grandmother   . Diabetes Maternal Grandfather   . Kidney Stones Sister     The following portions of the patient's history were reviewed and updated as appropriate: allergies, current medications, past family history, past medical history, past social history, past surgical history and problem list.  Review of Systems Pertinent items noted in HPI and remainder of comprehensive ROS otherwise negative.   Objective:  BP 117/80   Pulse 75   Wt 158 lb (71.7 kg)   BMI 27.99 kg/m  CONSTITUTIONAL: Well-developed, well-nourished female in no acute distress.  HENT:  Normocephalic, atraumatic, External right and left ear normal.  EYES: Conjunctivae and EOM are normal. Pupils are equal, round.  No scleral icterus.  NECK: Normal range of motion, supple, no masses.  Normal thyroid.  SKIN: Skin is warm and dry. No rash noted. Not diaphoretic. No erythema. No pallor. NEUROLOGIC: Alert and oriented to person, place, and time. Normal reflexes, muscle tone coordination. No cranial nerve deficit noted. PSYCHIATRIC: Normal mood and affect. Normal behavior. Normal judgment  and thought content. CARDIOVASCULAR: Normal heart rate noted, regular rhythm RESPIRATORY: Clear to auscultation bilaterally. Effort and breath sounds normal, no problems with respiration noted. BREASTS: Symmetric in size. No masses, skin changes, nipple drainage, or lymphadenopathy. Scar noted on left upper breast from previous lumpectomy. ABDOMEN: Soft, no distention noted.  No tenderness, rebound or guarding.  PELVIC:Deferred MUSCULOSKELETAL: Normal range of motion. No tenderness.  No cyanosis, clubbing, or edema.    Assessment and Plan:  There are no diagnoses linked to this encounter. Will follow up results of pap smear and manage accordingly. Mammogram scheduled Routine preventative health maintenance measures emphasized. Please refer to After Visit Summary for other counseling recommendations.    Earlie Server, RN, MSN, NP-BC Nurse Practitioner, [place] for [facility]

## 2019-02-06 ENCOUNTER — Encounter (HOSPITAL_COMMUNITY): Payer: Self-pay | Admitting: Emergency Medicine

## 2019-02-06 ENCOUNTER — Emergency Department (HOSPITAL_COMMUNITY)
Admission: EM | Admit: 2019-02-06 | Discharge: 2019-02-06 | Disposition: A | Discharge status: Home / Self Care | Payer: Medicare HMO | Attending: Emergency Medicine | Admitting: Emergency Medicine

## 2019-02-06 ENCOUNTER — Other Ambulatory Visit: Payer: Self-pay

## 2019-02-06 DIAGNOSIS — K644 Residual hemorrhoidal skin tags: Secondary | ICD-10-CM | POA: Diagnosis not present

## 2019-02-06 DIAGNOSIS — K648 Other hemorrhoids: Secondary | ICD-10-CM | POA: Diagnosis not present

## 2019-02-06 DIAGNOSIS — Z87891 Personal history of nicotine dependence: Secondary | ICD-10-CM | POA: Diagnosis not present

## 2019-02-06 DIAGNOSIS — K649 Unspecified hemorrhoids: Secondary | ICD-10-CM | POA: Diagnosis present

## 2019-02-06 LAB — CBC WITH DIFFERENTIAL/PLATELET
Abs Immature Granulocytes: 0 10*3/uL (ref 0.00–0.07)
Basophils Absolute: 0.1 10*3/uL (ref 0.0–0.1)
Basophils Relative: 1 %
Eosinophils Absolute: 0.1 10*3/uL (ref 0.0–0.5)
Eosinophils Relative: 2 %
HCT: 39.1 % (ref 36.0–46.0)
Hemoglobin: 12.6 g/dL (ref 12.0–15.0)
Immature Granulocytes: 0 %
Lymphocytes Relative: 29 %
Lymphs Abs: 1.1 10*3/uL (ref 0.7–4.0)
MCH: 30.4 pg (ref 26.0–34.0)
MCHC: 32.2 g/dL (ref 30.0–36.0)
MCV: 94.4 fL (ref 80.0–100.0)
Monocytes Absolute: 0.4 10*3/uL (ref 0.1–1.0)
Monocytes Relative: 11 %
Neutro Abs: 2.2 10*3/uL (ref 1.7–7.7)
Neutrophils Relative %: 57 %
Platelets: 170 10*3/uL (ref 150–400)
RBC: 4.14 MIL/uL (ref 3.87–5.11)
RDW: 12.1 % (ref 11.5–15.5)
WBC: 3.9 10*3/uL — ABNORMAL LOW (ref 4.0–10.5)
nRBC: 0 % (ref 0.0–0.2)

## 2019-02-06 LAB — COMPREHENSIVE METABOLIC PANEL
ALT: 14 U/L (ref 0–44)
AST: 19 U/L (ref 15–41)
Albumin: 4.3 g/dL (ref 3.5–5.0)
Alkaline Phosphatase: 65 U/L (ref 38–126)
Anion gap: 8 (ref 5–15)
BUN: 16 mg/dL (ref 8–23)
CO2: 27 mmol/L (ref 22–32)
Calcium: 9.5 mg/dL (ref 8.9–10.3)
Chloride: 109 mmol/L (ref 98–111)
Creatinine, Ser: 0.76 mg/dL (ref 0.44–1.00)
GFR calc Af Amer: >60 mL/min (ref >60)
GFR calc non Af Amer: >60 mL/min (ref >60)
Glucose, Bld: 132 mg/dL — ABNORMAL HIGH (ref 70–99)
Potassium: 4.7 mmol/L (ref 3.5–5.1)
Sodium: 144 mmol/L (ref 135–145)
Total Bilirubin: 0.2 mg/dL — ABNORMAL LOW (ref 0.3–1.2)
Total Protein: 6.8 g/dL (ref 6.5–8.1)

## 2019-02-06 NOTE — Discharge Instructions (Addendum)
You were evaluated in the Emergency Department and after careful evaluation, we did not find any emergent condition requiring admission or further testing in the hospital.  Your exam/testing today was overall reassuring.  Your blood levels were normal.  We recommend follow-up with your GI doctor.  Increasing fiber in your diet could help prevent bleeding.  Please return to the Emergency Department if you experience any worsening of your condition.  We encourage you to follow up with a primary care provider.  Thank you for allowing [place] to be a part of your care.

## 2019-02-06 NOTE — Progress Notes (Signed)
I reviewed note and agree with plan.   Penni Bombard, MD [id number], [id number] AM Certified in Neurology, Neurophysiology and Neuroimaging  [facility] [address], [place], [place] [address] [phone]

## 2019-02-06 NOTE — ED Triage Notes (Signed)
Pt complaint of "just a little bit" of bright red bleeding when wiping for a week; hx of hemorrhoids. Denies pain. Cannot see GI until after Thanksgiving.

## 2019-02-06 NOTE — ED Provider Notes (Signed)
[facility] Emergency Department Provider Note MRN:  [id number]  Arrival date & time: 02/06/19     Chief Complaint   Hemorrhoids   History of Present Illness   Teresa Nguyen is a 68 y.o. year-old female with a history of DVT, hemorrhoids presenting to the ED with chief complaint of hemorrhoids.  Small amount of bright red blood per rectum with wiping over the past week.  Her GI doctor is unable to see her for the next few weeks.  Denies rectal pain, no abdominal pain, no lightheadedness, no chest pain or shortness of breath, no fever, no other complaints.  Has known hemorrhoids but they are not painful.  Review of Systems  A complete 10 system review of systems was obtained and all systems are negative except as noted in the HPI and PMH.   Patient's Health History    Past Medical History:  Diagnosis Date  . Anxiety   . Arthritis   . DVT (deep venous thrombosis) ([place]) 2008   L leg  . Finger fracture, right    small  . Major depressive disorder    pt denies  . Migraine   . Seizure disorder [facility])    since age 47-3, last sz 12/31/15  . Thyroid disease   . Wears partial dentures     Past Surgical History:  Procedure Laterality Date  . BIOPSY  10/17/2018   Procedure: BIOPSY;  Surgeon: Wilford Corner, MD;  [facility]: [facility];  Service: Endoscopy;;  . BREAST LUMPECTOMY Bilateral 1990  . COLONOSCOPY WITH PROPOFOL N/A 10/17/2018   Procedure: COLONOSCOPY WITH PROPOFOL;  Surgeon: Wilford Corner, MD;  Location: [facility];  Service: Endoscopy;  Laterality: N/A;  . ESOPHAGOGASTRODUODENOSCOPY (EGD) WITH PROPOFOL N/A 10/17/2018   Procedure: ESOPHAGOGASTRODUODENOSCOPY (EGD) WITH PROPOFOL;  Surgeon: Wilford Corner, MD;  [facility]: [facility];  Service: Endoscopy;  Laterality: N/A;  . NASAL SINUS SURGERY    . POLYPECTOMY  10/17/2018   Procedure: POLYPECTOMY;  Surgeon: Wilford Corner, MD;  Location: [facility];  Service: Endoscopy;;  . TONSILLECTOMY     . TUBAL LIGATION      Family History  Problem Relation Age of Onset  . Stroke Father   . Heart attack Father   . Throat cancer Sister   . Seizures Sister   . Migraines Sister   . Diabetes Maternal Grandmother   . Diabetes Maternal Grandfather   . Kidney Stones Sister     Social History   Socioeconomic History  . Marital status: Divorced    Spouse name: Not on file  . Number of children: 2  . Years of education: 12th  . Highest education level: Not on file  Occupational History  . Occupation: disabled  Social Needs  . Financial resource strain: Not on file  . Food insecurity    Worry: Not on file    Inability: Not on file  . Transportation needs    Medical: Not on file    Non-medical: Not on file  Tobacco Use  . Smoking status: Former Smoker    Quit date: 04/16/1998    Years since quitting: 20.8  . Smokeless tobacco: Never Used  Substance and Sexual Activity  . Alcohol use: Yes    Comment: occ. social  . Drug use: Not Currently  . Sexual activity: Not Currently    Birth control/protection: Post-menopausal  Lifestyle  . Physical activity    Days per week: Not on file    Minutes per session: Not on file  .  Stress: Not on file  Relationships  . Social Herbalist on phone: Not on file    Gets together: Not on file    Attends religious service: Not on file    Active member of club or organization: Not on file    Attends meetings of clubs or organizations: Not on file    Relationship status: Not on file  . Intimate partner violence    Fear of current or ex partner: Not on file    Emotionally abused: Not on file    Physically abused: Not on file    Forced sexual activity: Not on file  Other Topics Concern  . Not on file  Social History Narrative   Patient lives at home alone.   Caffeine Use: 1 cup of coffee daily     Physical Exam  Vital Signs and Nursing Notes reviewed Vitals:   02/06/19 1255  BP: 124/79  Pulse: 79  Resp: 16  Temp: 98.2 F  (36.8 C)  SpO2: 95%    CONSTITUTIONAL: Well-appearing, NAD NEURO:  Alert and oriented x 3, no focal deficits EYES:  eyes equal and reactive ENT/NECK:  no LAD, no JVD CARDIO: Regular rate, well-perfused, normal S1 and S2 PULM:  CTAB no wheezing or rhonchi GI/GU:  normal bowel sounds, non-distended, non-tender; multiple external hemorrhoids, hemostatic, normal rectal tone, no gross blood, 2 visible internal hemorrhoids on anoscopy MSK/SPINE:  No gross deformities, no edema SKIN:  no rash, atraumatic PSYCH:  Appropriate speech and behavior  Diagnostic and Interventional Summary    EKG Interpretation  Date/Time:    Ventricular Rate:    PR Interval:    QRS Duration:   QT Interval:    QTC Calculation:   R Axis:     Text Interpretation:        Labs Reviewed  COMPREHENSIVE METABOLIC PANEL - Abnormal; Notable for the following components:      Result Value   Glucose, Bld 132 (*)    Total Bilirubin 0.2 (*)    All other components within normal limits  CBC WITH DIFFERENTIAL/PLATELET - Abnormal; Notable for the following components:   WBC 3.9 (*)    All other components within normal limits    No orders to display    Medications - No data to display   LOWER ENDOSCOPY  Date/Time: 02/06/2019 1:28 PM Performed by: Maudie Flakes, MD Authorized by: Maudie Flakes, MD  Consent: Verbal consent obtained. Risks and benefits: risks, benefits and alternatives were discussed Consent given by: patient Patient understanding: patient states understanding of the procedure being performed Indications: hemorrhoids and rectal bleeding Scope type: anoscope External exam performed: yes Positive external exam findings: external hemorrhoids External hemorrhoid position: twelve o'clock, eleven o'clock, ten o'clock and nine o'clock Positive internal exam findings: internal hemorrhoid Internal hemorrhoid position: eleven o'clock and six o'clock Procedure termination: procedure complete  Patient tolerance: patient tolerated the procedure well with no immediate complications Comments: Multiple internal and external hemorrhoids, hemostatic, single internal hemorrhoid at the 6 o'clock position with some evidence to suggest recent bleeding.      /  Critical Care  ED Course and Medical Decision Making  I have reviewed the triage vital signs and the nursing notes.  Pertinent labs & imaging results that were available during my care of the patient were reviewed by me and considered in my medical decision making (see below for details).  Favoring bright red blood per rectum due to hemorrhoids, reassuring exam, doubt GI bleed,  anoscopy described above with possible detection of culprit bleeding hemorrhoid.  Will screen with CBC to ensure no significant blood loss, anticipating discharged.  2:30 PM update: Labs reassuring, appropriate for discharge.    Barth Kirks. [place] Small, [place] [email or web address]  Final Clinical Impressions(s) / ED Diagnoses     ICD-10-CM   1. External hemorrhoids  K64.4   2. Internal hemorrhoids  K64.8     ED Discharge Orders    None      Discharge Instructions Discussed with and Provided to Patient:   Discharge Instructions     You were evaluated in the Emergency Department and after careful evaluation, we did not find any emergent condition requiring admission or further testing in the hospital.  Your exam/testing today was overall reassuring.  Your blood levels were normal.  We recommend follow-up with your GI doctor.  Increasing fiber in your diet could help prevent bleeding.  Please return to the Emergency Department if you experience any worsening of your condition.  We encourage you to follow up with a primary care provider.  Thank you for allowing [place] to be a part of your care.        Maudie Flakes, MD 02/06/19 1440

## 2019-02-08 ENCOUNTER — Other Ambulatory Visit: Payer: Self-pay

## 2019-02-08 ENCOUNTER — Emergency Department (HOSPITAL_COMMUNITY)
Admission: EM | Admit: 2019-02-08 | Discharge: 2019-02-09 | Disposition: A | Discharge status: Home / Self Care | Payer: Medicare HMO | Attending: Emergency Medicine | Admitting: Emergency Medicine

## 2019-02-08 ENCOUNTER — Emergency Department (HOSPITAL_COMMUNITY): Payer: Medicare HMO

## 2019-02-08 ENCOUNTER — Encounter (HOSPITAL_COMMUNITY): Payer: Self-pay | Admitting: Emergency Medicine

## 2019-02-08 ENCOUNTER — Telehealth: Payer: Self-pay | Admitting: Family Medicine

## 2019-02-08 DIAGNOSIS — Z87891 Personal history of nicotine dependence: Secondary | ICD-10-CM | POA: Diagnosis not present

## 2019-02-08 DIAGNOSIS — W19XXXA Unspecified fall, initial encounter: Secondary | ICD-10-CM | POA: Insufficient documentation

## 2019-02-08 DIAGNOSIS — R42 Dizziness and giddiness: Secondary | ICD-10-CM | POA: Insufficient documentation

## 2019-02-08 DIAGNOSIS — S82832A Other fracture of upper and lower end of left fibula, initial encounter for closed fracture: Secondary | ICD-10-CM | POA: Insufficient documentation

## 2019-02-08 DIAGNOSIS — R55 Syncope and collapse: Secondary | ICD-10-CM | POA: Diagnosis not present

## 2019-02-08 DIAGNOSIS — I10 Essential (primary) hypertension: Secondary | ICD-10-CM | POA: Insufficient documentation

## 2019-02-08 DIAGNOSIS — Y998 Other external cause status: Secondary | ICD-10-CM | POA: Insufficient documentation

## 2019-02-08 DIAGNOSIS — S99912A Unspecified injury of left ankle, initial encounter: Secondary | ICD-10-CM | POA: Diagnosis present

## 2019-02-08 DIAGNOSIS — Y92018 Other place in single-family (private) house as the place of occurrence of the external cause: Secondary | ICD-10-CM | POA: Insufficient documentation

## 2019-02-08 DIAGNOSIS — Y9301 Activity, walking, marching and hiking: Secondary | ICD-10-CM | POA: Insufficient documentation

## 2019-02-08 LAB — URINALYSIS, ROUTINE W REFLEX MICROSCOPIC
Glucose, UA: NEGATIVE mg/dL
Hgb urine dipstick: NEGATIVE
Ketones, ur: 5 mg/dL — AB
Leukocytes,Ua: NEGATIVE
Nitrite: NEGATIVE
Protein, ur: NEGATIVE mg/dL
Specific Gravity, Urine: 1.025 (ref 1.005–1.030)
pH: 5 (ref 5.0–8.0)

## 2019-02-08 LAB — CBC
HCT: 42.5 % (ref 36.0–46.0)
Hemoglobin: 13.7 g/dL (ref 12.0–15.0)
MCH: 30.4 pg (ref 26.0–34.0)
MCHC: 32.2 g/dL (ref 30.0–36.0)
MCV: 94.4 fL (ref 80.0–100.0)
Platelets: 197 10*3/uL (ref 150–400)
RBC: 4.5 MIL/uL (ref 3.87–5.11)
RDW: 12.2 % (ref 11.5–15.5)
WBC: 8 10*3/uL (ref 4.0–10.5)
nRBC: 0 % (ref 0.0–0.2)

## 2019-02-08 LAB — BASIC METABOLIC PANEL
Anion gap: 11 (ref 5–15)
BUN: 12 mg/dL (ref 8–23)
CO2: 20 mmol/L — ABNORMAL LOW (ref 22–32)
Calcium: 9 mg/dL (ref 8.9–10.3)
Chloride: 112 mmol/L — ABNORMAL HIGH (ref 98–111)
Creatinine, Ser: 0.74 mg/dL (ref 0.44–1.00)
GFR calc Af Amer: >60 mL/min (ref >60)
GFR calc non Af Amer: >60 mL/min (ref >60)
Glucose, Bld: 113 mg/dL — ABNORMAL HIGH (ref 70–99)
Potassium: 4.1 mmol/L (ref 3.5–5.1)
Sodium: 143 mmol/L (ref 135–145)

## 2019-02-08 LAB — CBG MONITORING, ED: Glucose-Capillary: 100 mg/dL — ABNORMAL HIGH (ref 70–99)

## 2019-02-08 MED ORDER — SODIUM CHLORIDE 0.9% FLUSH: 3.0000 mL | Freq: Once | INTRAVENOUS | Status: DC

## 2019-02-08 MED ORDER — FENTANYL CITRATE (PF) 100 MCG/2ML IJ SOLN
50.0000 ug | Freq: Once | INTRAMUSCULAR | Status: AC
  Administered 2019-02-09: 50 ug via INTRAVENOUS
  Filled 2019-02-08: qty 2

## 2019-02-08 MED ORDER — LACTATED RINGERS IV BOLUS
1000.0000 mL | Freq: Once | INTRAVENOUS | Status: AC
  Administered 2019-02-09: 1000 mL via INTRAVENOUS
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
## 2019-02-08 NOTE — Telephone Encounter (Signed)
Pt has gone to ED for evaluation.

## 2019-02-08 NOTE — ED Provider Notes (Signed)
Emergency Department Provider Note   I have reviewed the triage vital signs and the nursing notes.   HISTORY  Chief Complaint Weakness   HPI Teresa Nguyen is a 68 y.o. female who presents the emergency department today after a near syncopal episode.  Patient states that she woke up from sleep and had a split second episode of a sharp pain that radiated down her arm and then immediately resolved.  She had no other associated symptoms with that but then when she went to stand up she was walking towards the door felt a little bit lightheaded and felt her hands and knees.  She did not syncopized all the way she remembers the whole fall.  She had pain in her left ankle since that time and also in her left ribs.  No other associated symptoms.  No chest pain or palpitations.  No shortness of breath.  No fever.     No other associated or modifying symptoms.    Past Medical History:  Diagnosis Date  . Anxiety   . Arthritis   . DVT (deep venous thrombosis) ([place]) 2008   L leg  . Finger fracture, right    small  . Major depressive disorder    pt denies  . Migraine   . Seizure disorder [facility])    since age 40-3, last sz 12/31/15  . Thyroid disease   . Wears partial dentures     Patient Active Problem List   Diagnosis Date Noted  . Abnormal loss of weight 10/17/2018  . GERD (gastroesophageal reflux disease) 10/17/2018  . Change in stool 10/17/2018  . Seizure ([place]) 12/27/2017  . Frontal lobe epilepsy ([place]) 01/01/2014  . Complex partial epilepsy ([place]) 12/29/2013  . Localization-related (focal) (partial) epilepsy and epileptic syndromes with complex partial seizures, without mention of intractable epilepsy 05/09/2013  . Migraine 02/21/2013  . Right lower quadrant pain 02/21/2013  . Hypertension 02/21/2013  . Stress at home 02/21/2013  . Restless leg syndrome 12/28/2012  . 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 BIOPSY   10/17/2018   Procedure: BIOPSY;  Surgeon: Wilford Corner, MD;  Location: [facility];  Service: Endoscopy;;  . BREAST LUMPECTOMY Bilateral 1990  . COLONOSCOPY WITH PROPOFOL N/A 10/17/2018   Procedure: COLONOSCOPY WITH PROPOFOL;  Surgeon: Wilford Corner, MD;  Location: [facility];  Service: Endoscopy;  Laterality: N/A;  . ESOPHAGOGASTRODUODENOSCOPY (EGD) WITH PROPOFOL N/A 10/17/2018   Procedure: ESOPHAGOGASTRODUODENOSCOPY (EGD) WITH PROPOFOL;  Surgeon: Wilford Corner, MD;  Location: [facility];  Service: Endoscopy;  Laterality: N/A;  . NASAL SINUS SURGERY    . POLYPECTOMY  10/17/2018   Procedure: POLYPECTOMY;  Surgeon: Wilford Corner, MD;  Location: [facility];  Service: Endoscopy;;  . TONSILLECTOMY    . TUBAL LIGATION      Current Outpatient Rx  . Order #: [id number] Class: Historical Med  . Order #: [id number] Class: Normal  . Order #: [id number] Class: Historical Med  . Order #: [id number] Class: Historical Med  . Order #: [id number] Class: Historical Med  . Order #: [id number] Class: Historical Med  . Order #: [id number] Class: Historical Med  . Order #: [id number] Class: Historical Med  . Order #: [id number] Class: Historical Med  . Order #: [id number] Class: Historical Med  . Order #: [id number] Class: Normal  . Order #: [id number] Class: Historical Med  . Order #: [id number] Class: Historical Med  . Order #: [id number] Class: Normal  . Order #: [id number] Class: Historical Med  . Order #: [id number] Class: Historical Med  .  Order #: [id number] Class: Normal  . Order #: [id number] Class: Historical Med  . Order #: [id number] Class: Normal  . Order #: [id number] Class: Normal  . Order #: [id number] Class: Historical Med    Allergies Peanut oil, Other, and Sulfa antibiotics  Family History  Problem Relation Age of Onset  . Stroke Father   . Heart attack Father   . Throat cancer Sister   . Seizures Sister   . Migraines Sister   . Diabetes Maternal Grandmother   . Diabetes Maternal Grandfather    . Kidney Stones Sister     Social History Social History   Tobacco Use  . Smoking status: Former Smoker    Quit date: 04/16/1998    Years since quitting: 20.8  . Smokeless tobacco: Never Used  Substance Use Topics  . Alcohol use: Yes    Comment: occ. social  . Drug use: Not Currently    Review of Systems  All other systems negative except as documented in the HPI. All pertinent positives and negatives as reviewed in the HPI. ____________________________________________   PHYSICAL EXAM:  VITAL SIGNS: ED Triage Vitals  Enc Vitals Group     BP 02/08/19 1229 (!) 142/80     Pulse Rate 02/08/19 1229 76     Resp 02/08/19 1229 18     Temp 02/08/19 1229 99.2 F (37.3 C)     Temp Source 02/08/19 1229 Oral     SpO2 02/08/19 1229 97 %    Constitutional: Alert and oriented. Well appearing and in no acute distress. Eyes: Conjunctivae are normal. PERRL. EOMI. Head: Atraumatic. Nose: No congestion/rhinnorhea. Mouth/Throat: Mucous membranes are moist.  Oropharynx non-erythematous. Neck: No stridor.  No meningeal signs.   Cardiovascular: Normal rate, regular rhythm. Good peripheral circulation. Grossly normal heart sounds.   Respiratory: Normal respiratory effort.  No retractions. Lungs CTAB. Gastrointestinal: Soft and nontender. No distention.  Musculoskeletal: edema to left ankle, pain with palpation and ROM of left ankle, no proximal fibular pain Neurologic:  Normal speech and language. No gross focal neurologic deficits are appreciated.  Skin:  Skin is warm, dry and intact. No rash noted. Abrasion to left posterior back  ____________________________________________   LABS (all labs ordered are listed, but only abnormal results are displayed)  Labs Reviewed  BASIC METABOLIC PANEL - Abnormal; Notable for the following components:      Result Value   Chloride 112 (*)    CO2 20 (*)    Glucose, Bld 113 (*)    All other components within normal limits  URINALYSIS, ROUTINE W  REFLEX MICROSCOPIC - Abnormal; Notable for the following components:   APPearance CLOUDY (*)    Bilirubin Urine MODERATE (*)    Ketones, ur 5 (*)    All other components within normal limits  CBG MONITORING, ED - Abnormal; Notable for the following components:   Glucose-Capillary 100 (*)    All other components within normal limits  CBC  MAGNESIUM  TROPONIN I (HIGH SENSITIVITY)   ____________________________________________  EKG   EKG Interpretation  Date/Time:  Wednesday February 08 2019 12:36:36 EDT Ventricular Rate:  71 PR Interval:  158 QRS Duration: 72 QT Interval:  380 QTC Calculation: 412 R Axis:   -22 Text Interpretation: Normal sinus rhythm Anterolateral infarct , age undetermined Abnormal ECG No significant change since last tracing Confirmed by Merrily Pew [phone]) on 02/08/2019 11:10:39 PM       ____________________________________________  RADIOLOGY  Ct Head Wo Contrast  Result Date: 02/08/2019 CLINICAL DATA:  Focal neural deficit.  EXAM: CT HEAD WITHOUT CONTRAST TECHNIQUE: Contiguous axial images were obtained from the base of the skull through the vertex without intravenous contrast. COMPARISON:  June 03, 2014. FINDINGS: Brain: No evidence of acute infarction, hemorrhage, hydrocephalus, extra-axial collection or mass lesion/mass effect. Vascular: No hyperdense vessel or unexpected calcification. Skull: Normal. Negative for fracture or focal lesion. Sinuses/Orbits: No acute finding. Other: None. IMPRESSION: Normal head CT. Electronically Signed   By: Marijo Conception M.D.   On: 02/08/2019 14:10    ____________________________________________   PROCEDURES  Procedure(s) performed:   Procedures   ____________________________________________   INITIAL IMPRESSION / ASSESSMENT AND PLAN / ED COURSE  Ultimate found to have a nondisplaced fibular fracture.  Rest of imaging was negative.  Rest of work-up was negative.  She was given some fluids and of the  pain medicine patient was able ambulate around the department at her baseline.  Suspect orthostatic syncopal episode.  Stable for discharge w/ PCP follow up for ankle or orthopedics if they are not comfortable. Cam walker placed. Rx for short course of pain meds provided.     Pertinent labs & imaging results that were available during my care of the patient were reviewed by me and considered in my medical decision making (see chart for details).  A medical screening exam was performed and I feel the patient has had an appropriate workup for their chief complaint at this time and likelihood of emergent condition existing is low. They have been counseled on decision, discharge, follow up and which symptoms necessitate immediate return to the emergency department. They or their family verbally stated understanding and agreement with plan and discharged in stable condition.   ____________________________________________  FINAL CLINICAL IMPRESSION(S) / ED DIAGNOSES  Final diagnoses:  None     MEDICATIONS GIVEN DURING THIS VISIT:  Medications  sodium chloride flush (NS) 0.9 % injection 3 mL (3 mLs Intravenous Not Given 02/08/19 2316)     NEW OUTPATIENT MEDICATIONS STARTED DURING THIS VISIT:  New Prescriptions   No medications on file    Note:  This note was prepared with assistance of Dragon voice recognition software. Occasional wrong-word or sound-a-like substitutions may have occurred due to the inherent limitations of voice recognition software.   Aswad Wandrey, Corene Cornea, MD 02/09/19 [phone]

## 2019-02-08 NOTE — ED Triage Notes (Addendum)
Felt an electric feeling in left arm , she grabbed it with her rt hand and felt the electrict in her arm and  And  She  She had to pee and she fell on her knee and twisted her ankle about 530  Am ,   She is sore and hurts everywhere , her butt her back  Ankle , was [facility]  On the 26 , called ems and was eval for stroke at 6 am and  Did not want to come then  VAN id neg

## 2019-02-08 NOTE — ED Notes (Signed)
Patient transported to X-ray 

## 2019-02-09 DIAGNOSIS — S82832A Other fracture of upper and lower end of left fibula, initial encounter for closed fracture: Secondary | ICD-10-CM | POA: Diagnosis not present

## 2019-02-09 LAB — MAGNESIUM: Magnesium: 2 mg/dL (ref 1.7–2.4)

## 2019-02-09 LAB — TROPONIN I (HIGH SENSITIVITY): Troponin I (High Sensitivity): 6 ng/L (ref <18)

## 2019-02-09 MED ORDER — OXYCODONE-ACETAMINOPHEN 5-325 MG PO TABS: 1.0000 | ORAL_TABLET | Freq: Three times a day (TID) | ORAL | 0 refills | Status: DC | PRN

## 2019-02-09 NOTE — Progress Notes (Signed)
Orthopedic Tech Progress Note Patient Details:  Teresa Nguyen 1950-10-17 [id number]  Ortho Devices Type of Ortho Device: CAM walker Ortho Device/Splint Interventions: Adjustment, Application, Ordered   Post Interventions Patient Tolerated: Well Instructions Provided: Care of device, Adjustment of device, Poper ambulation with device   Melony Overly T 02/09/2019, 2:11 AM

## 2019-02-09 NOTE — ED Notes (Signed)
Pt ambulating well in the hallway with cane she uses at home

## 2019-02-09 NOTE — Telephone Encounter (Signed)
I called pt and she is resting with boot on her foot due to ankle fx from falling when woke from having electrical shocks in her L arm. Painful made her passed out and fall where she fx'd fibia.  I relayed that f/u pcp, if feels like needs neurologist, please refer back to [place] for evaluation.  She will do this if needed.  Appreciated call back.

## 2019-02-09 NOTE — ED Notes (Signed)
Discharge instructions discussed with pt. Pt verbalized understanding. Pt stable and ambulatory. No signature pad available. 

## 2019-05-04 ENCOUNTER — Ambulatory Visit: Payer: Medicare HMO | Attending: Internal Medicine

## 2019-05-04 DIAGNOSIS — Z23 Encounter for immunization: Secondary | ICD-10-CM

## 2019-05-04 NOTE — Progress Notes (Signed)
   Covid-19 Vaccination Clinic  Name:  Teresa Nguyen    MRN: [id number] DOB: 12-09-1950  05/04/2019  [name] was observed post Covid-19 immunization for 15 minutes without incidence. She was provided with Vaccine Information Sheet and instruction to access the V-Safe system.   [name] was instructed to call 911 with any severe reactions post vaccine: Marland Kitchen Difficulty breathing  . Swelling of your face and throat  . A fast heartbeat  . A bad rash all over your body  . Dizziness and weakness    Immunizations Administered    Name Date Dose VIS Date Route   Pfizer COVID-19 Vaccine 05/04/2019 10:56 AM 0.3 mL 03/24/2019 Intramuscular   Manufacturer: [place]   Lot: [id number]   [place]: [id number]

## 2019-05-06 ENCOUNTER — Ambulatory Visit: Payer: Medicare HMO

## 2019-05-08 ENCOUNTER — Other Ambulatory Visit: Payer: Self-pay | Admitting: Family Medicine

## 2019-05-08 NOTE — Telephone Encounter (Signed)
Called Steve w/[employer], LVM advising him they may send patient a replacement of Vimpat. Left # for questions.

## 2019-05-08 NOTE — Telephone Encounter (Signed)
Richardson Landry from [employer] called in and stated the patient Teresa Nguyen was sent out on 01/18 patient received the package damaged and medication was missing, patient received an apology letter from postal service. Richardson Landry is requesting verbal permission to resend medication  CB# [phone] ext [phone]

## 2019-05-24 ENCOUNTER — Ambulatory Visit: Payer: Medicare HMO | Attending: Internal Medicine

## 2019-05-24 DIAGNOSIS — Z23 Encounter for immunization: Secondary | ICD-10-CM

## 2019-05-24 NOTE — Progress Notes (Signed)
   Covid-19 Vaccination Clinic  Name:  Teresa Nguyen    MRN: [id number] DOB: [birth year]  05/24/2019  Teresa Nguyen was observed post Covid-19 immunization for 15 minutes without incidence. She was provided with Vaccine Information Sheet and instruction to access the V-Safe system.   Teresa Nguyen was instructed to call 911 with any severe reactions post vaccine: Marland Kitchen Difficulty breathing  . Swelling of your face and throat  . A fast heartbeat  . A bad rash all over your body  . Dizziness and weakness    Immunizations Administered    Name Date Dose VIS Date Route   Pfizer COVID-19 Vaccine 05/24/2019  4:34 PM 0.3 mL 03/24/2019 Intramuscular   Manufacturer: [place]   Lot: [id number]   [place]: [id number]

## 2019-06-27 ENCOUNTER — Encounter: Payer: Self-pay | Admitting: Family Medicine

## 2019-06-27 ENCOUNTER — Other Ambulatory Visit: Payer: Self-pay

## 2019-06-27 ENCOUNTER — Ambulatory Visit (INDEPENDENT_AMBULATORY_CARE_PROVIDER_SITE_OTHER): Payer: Medicare HMO | Admitting: Family Medicine

## 2019-06-27 VITALS — BP 121/81 | HR 77 | Temp 97.2°F | Ht 63.0 in | Wt 152.0 lb

## 2019-06-27 DIAGNOSIS — G40219 Localization-related (focal) (partial) symptomatic epilepsy and epileptic syndromes with complex partial seizures, intractable, without status epilepticus: Secondary | ICD-10-CM | POA: Diagnosis not present

## 2019-06-27 DIAGNOSIS — R569 Unspecified convulsions: Secondary | ICD-10-CM | POA: Diagnosis not present

## 2019-06-27 DIAGNOSIS — G2581 Restless legs syndrome: Secondary | ICD-10-CM | POA: Diagnosis not present

## 2019-06-27 MED ORDER — TOPIRAMATE 25 MG PO TABS: 25.0000 mg | ORAL_TABLET | Freq: Two times a day (BID) | ORAL | 3 refills | Status: DC

## 2019-06-27 MED ORDER — TOPIRAMATE 100 MG PO TABS: 100.0000 mg | ORAL_TABLET | Freq: Two times a day (BID) | ORAL | 3 refills | Status: DC

## 2019-06-27 MED ORDER — LACOSAMIDE 200 MG PO TABS: 200.0000 mg | ORAL_TABLET | Freq: Two times a day (BID) | ORAL | 5 refills | Status: DC

## 2019-06-27 NOTE — Progress Notes (Signed)
PATIENT: Teresa Nguyen DOB: 1951-04-02  REASON FOR VISIT: follow up HISTORY FROM: patient  Chief Complaint  Patient presents with  . Follow-up    Rm 2, alone. no seizures.  Had fall, broke ankle, coccyx.  . Seizures     HISTORY OF PRESENT ILLNESS: Today 06/27/19 Teresa Nguyen is a 69 y.o. female here today for follow up for seizures and RLS. She continues topiramate 125mg  twice daily and lacosamide 200mg  twice daily. She also continues ropinirole 1mg  at bedtime. She is doing well. She denies seizures. RLS is well managed. She is recovering from a fall in 01/2019 where she broke her ankle and coccyx. She fell after rolling her ankle while trying to cut on a light in her room. She is living alone. She does not drive due to not having a car. She is able to perform ADL's independently.   HISTORY: (copied from my note on 12/28/2018)  Teresa Nguyen is a 69 y.o. female here today for follow up for seizure disorder and RLS. She continues topiramate 125mg  twice daily and lacosamide 200mg  twice daily. She was seen by [facility] for evaluation of non epileptic events. No changes were made to regimen as she had no further events after increasing topiramate back to 125mg  twice daily. She reports that she had one event about 3 months ago. She was lying in beg and felt it coming on. She went to take a Valium but event was over before she could get to her medications. She denies tonic clonic movements. She denies incontinence or tongue injury. Event was very brief and she had been under significant stress that day. She continues ropinirole 1mg  at bedtime and feels it helps. She is seeing psychiatry regularly. She was last seen about a month ago. She does not drive.   HISTORY: (copied from Dr Gladstone Lighter note on 12/29/2017)  UPDATE (12/29/17, VRP): Since last visit, doingwell until 12/21/17 and 12/27/17 --> chest muscle spasm, difficulty talking, but no LOC. Each attack lasted 10-20 seconds, 30-50 attacks.  Went to ER on 12/21/17. Then again to ER in 12/27/17. Had been taking CBZ for 3 months prior, but felt like it was triggering seizures, so she stopped in early Sept 2019. Mood is stable.  UPDATE (10/11/17, VRP): Since last visit, doing well until 1-2 weeks ago, woke up in night with chest muscle spasm; lasted 30 minutes. Then again over next 2 nights, 30-45 minutes. Also last night with similar events. No LOC or altered consciousness. No aphasia. Tolerating meds. Having more GI issues (diarrhea) in last few months.   UPDATE (03/17/17, VRP): Since last visit, doing well. Tolerating meds. No alleviating or aggravating factors. No seizures. Has seen Dr. Noemi Chapel (psychiatry) and was started on Equetro (carbamazepine ER samples).   UPDATE 08/25/16: Since last visit, no seizures. Had some "aura" sensations which consistent of "pulse" in the head sensation, esp with stress. Tolerating meds.   UPDATE 12/31/15: Last night forgot vimpat dose; this morning at 4am, woke up, had to urinate, stood up and then fell back on to bed, may have had brief LOC. Thinks she had a seizure. No post-ictal confusion. No tongue biting. Restless legs stable with medication.   UPDATE 12/31/14: Since last visit, doing well, no seizures. Used valium 10mg  x 1 time in the last year for a seizure aura. Tolerating vimpat 200mg  BID + TPX 100mg  BID. Went to ER in Feb for syncope (dx with dehydration).   UPDATE 12/29/13 (LL): Since last visit,  went to [facility] to have EMU monitoring. EMU report -->June 15-18, 2015: "Long-term video EEG monitoring captured multiple events; including electrographic frontal hypermotor seizures. She also exhibited episodes of oral dyskinetic activity which is not seizure (no EEG correlate)." It was concluded that her typical arousal events are consistent with frontal lobe epilepsy. Home medications Vimpat 200 mg twice a day,Topirimate 100 mg twice a day were resumed, but they recommended reducing home dose of  Valium as she was taking 10-20 mg when necessary which likely to affects her affect and impairs her communication. She has had several close family members who have died in the last year, including her mother and her sister who died suddenly. Patient is very content on her current medication regimen, stating that she has felt better on Vimpat than ever before. She has not had many nocturnal seizures since starting Vimpat, and states that she was afraid to go to sleep prior to starting it. Her past AEDs have been phenobarbital, phenytoin, Lacosamide, lamotrigine, levetiracetam, Topirimate, valproic acid and zonisamide. Known seizure triggers are eating broccoli or eggplant. RLS symptoms are controlled on Ropinirole, although some nights she must take an extra 0.25 of a tablet to be comfortable.  UPDATE 12/28/12: Outside neurologist records received and reviewed. 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 Since last visit, no more seizures or spells. Her RLS is getting worse, b/c she is running out of ropinirole and was reducing the dose. Still with high stress. Not interested in seeing psychiatry.   UPDATE 06/10/12: Since last visit, sz stopped, until Dec 2013. She was preparing for colonoscopy, and anticipated having diarrhea from bowel prep. Before she took the bowel prep meds, she started with diarrhea. Soon after she had a seizure (no LOC, eye up, head turned, mouth twitching; no generalized convulsions. she could hear and move, but not talk). She took a valium, and her spell stopped. I still haven't received records from prior neurologist. Patient tells me that she was dx'd with "partial non-epileptic seizures". She was told to take valium prn to stop her seizures at home, in order to avoid freq ER trips.   PRIOR HPI (12/04/11): 69 year old right-handed female here for evaluation of seizure disorder. At age 13 years old, patient fell down a flight of stairs. Soon after  she began to have "petit mall seizures". Patient states she was having inability to speak, convulsions and tongue biting. She started on Dilantin and phenobarbital early in life. Over the years she tried "many different seizure medications". She was living in [place] for most of her life and followed by neurology there. She had extensive testing with EEG, video EEG, sleep study, MRI, without any significant abnormalities. Unclear if any of these seizures were captured during EEG recordings. It sounds like no epileptiform discharges were ever noted. Unfortunately I do not have any of her prior records to review myself for this visit. Patient has been on Vimpat and topiramate over the past year, and now has one episode every 3 weeks of "seizure". Current episodes consist of gagging sensation, inability to speak, without loss of consciousness tongue biting or convulsions.    REVIEW OF SYSTEMS: Out of a complete 14 system review of symptoms, the patient complains only of the following symptoms, and all other reviewed systems are negative.  ALLERGIES: Allergies  Allergen Reactions  . Peanut Oil Diarrhea    Nuts in general   . Other Diarrhea    Nuts and dark green vegetables cause diarrhea  and dehydration  . Sulfa Antibiotics Itching and Rash    Red all over    HOME MEDICATIONS: Outpatient Medications Prior to Visit  Medication Sig Dispense Refill  . acetaminophen (TYLENOL) 500 MG tablet Take 500 mg by mouth 2 (two) times daily as needed for moderate pain or headache.    Marland Kitchen amLODipine (NORVASC) 5 MG tablet Take 1 tablet (5 mg total) by mouth daily. 30 tablet 0  . atorvastatin (LIPITOR) 10 MG tablet Take 10 mg by mouth at bedtime.     . Calcium Carbonate-Vitamin D (CALCIUM-D PO) Take 2 tablets by mouth daily.     . Carboxymethylcellulose Sodium (REFRESH LIQUIGEL OP) Place 1 drop into both eyes daily as needed (dry eyes).    . Desvenlafaxine Succinate ER 25 MG TB24 Take 25 mg by mouth daily.   1  .  diazepam (VALIUM) 10 MG tablet Take 10 mg by mouth daily as needed (seizure).    . diclofenac sodium (VOLTAREN) 1 % GEL Apply 1 application topically 4 (four) times daily as needed for pain.    . fluticasone (FLONASE) 50 MCG/ACT nasal spray Place 2 sprays into both nostrils at bedtime as needed for allergies.     Marland Kitchen levothyroxine (SYNTHROID, LEVOTHROID) 25 MCG tablet Take 25 mcg by mouth daily before breakfast.     . loperamide (IMODIUM A-D) 2 MG tablet Take 1 mg by mouth 4 (four) times daily as needed for diarrhea or loose stools.    . methocarbamol (ROBAXIN) 750 MG tablet Take 1 tablet (750 mg total) by mouth 2 (two) times daily as needed for muscle spasms. (Patient taking differently: Take 750 mg by mouth at bedtime. ) 20 tablet 0  . Multiple Vitamins-Minerals (MULTIVITAMIN ADULT) TABS Take 1 tablet by mouth daily.    . polyethylene glycol (MIRALAX / GLYCOLAX) packet Take 4.25 g by mouth daily.     Marland Kitchen rOPINIRole (REQUIP) 1 MG tablet Take 1 tablet (1 mg total) by mouth daily. 30 tablet 0  . sodium chloride (OCEAN) 0.65 % SOLN nasal spray Place 1 spray into both nostrils as needed for congestion.    . vitamin E 400 UNIT capsule Take 400 Units by mouth at bedtime.    Marland Kitchen lacosamide (VIMPAT) 200 MG TABS tablet Take 1 tablet (200 mg total) by mouth 2 (two) times daily. 60 tablet 5  . topiramate (TOPAMAX) 100 MG tablet Take 1 tablet (100 mg total) by mouth 2 (two) times daily. (Patient taking differently: Take 100 mg by mouth 2 (two) times daily. Take with topiramate 25 mg to equal 125 mg) 180 tablet 1  . topiramate (TOPAMAX) 25 MG tablet Take 1 tablet (25 mg total) by mouth 2 (two) times daily. (Patient taking differently: Take 25 mg by mouth 2 (two) times daily. Take with topiramate 100 mg to equal 150 mg) 180 tablet 1  . oxyCODONE-acetaminophen (PERCOCET) 5-325 MG tablet Take 1 tablet by mouth every 8 (eight) hours as needed for severe pain. 10 tablet 0   No facility-administered medications prior to  visit.    PAST MEDICAL HISTORY: Past Medical History:  Diagnosis Date  . Anxiety   . Arthritis   . DVT (deep venous thrombosis) ([place]) 2008   L leg  . Finger fracture, right    small  . Major depressive disorder    pt denies  . Migraine   . Seizure disorder [facility])    since age 42-3, last sz 12/31/15  . Thyroid disease   . Wears partial  dentures     PAST SURGICAL HISTORY: Past Surgical History:  Procedure Laterality Date  . BIOPSY  10/17/2018   Procedure: BIOPSY;  Surgeon: Wilford Corner, MD;  Location: [facility] ENDOSCOPY;  Service: Endoscopy;;  . BREAST LUMPECTOMY Bilateral 1990  . COLONOSCOPY WITH PROPOFOL N/A 10/17/2018   Procedure: COLONOSCOPY WITH PROPOFOL;  Surgeon: Wilford Corner, MD;  Location: [facility] ENDOSCOPY;  Service: Endoscopy;  Laterality: N/A;  . ESOPHAGOGASTRODUODENOSCOPY (EGD) WITH PROPOFOL N/A 10/17/2018   Procedure: ESOPHAGOGASTRODUODENOSCOPY (EGD) WITH PROPOFOL;  Surgeon: Wilford Corner, MD;  Location: [facility] ENDOSCOPY;  Service: Endoscopy;  Laterality: N/A;  . NASAL SINUS SURGERY    . POLYPECTOMY  10/17/2018   Procedure: POLYPECTOMY;  Surgeon: Wilford Corner, MD;  Location: [facility] ENDOSCOPY;  Service: Endoscopy;;  . TONSILLECTOMY    . TUBAL LIGATION      FAMILY HISTORY: Family History  Problem Relation Age of Onset  . Stroke Father   . Heart attack Father   . Throat cancer Sister   . Seizures Sister   . Migraines Sister   . Diabetes Maternal Grandmother   . Diabetes Maternal Grandfather   . Kidney Stones Sister     SOCIAL HISTORY: Social History   Socioeconomic History  . Marital status: Divorced    Spouse name: Not on file  . Number of children: 2  . Years of education: 12th  . Highest education level: Not on file  Occupational History  . Occupation: disabled  Tobacco Use  . Smoking status: Former Smoker    Quit date: 04/16/1998    Years since quitting: 21.2  . Smokeless tobacco: Never Used  Substance and Sexual Activity  . Alcohol use: Yes     Comment: occ. social  . Drug use: Not Currently  . Sexual activity: Not Currently    Birth control/protection: Post-menopausal  Other Topics Concern  . Not on file  Social History Narrative   Patient lives at home alone.   Caffeine Use: 1 cup of coffee daily   Social Determinants of Health   Financial Resource Strain:   . Difficulty of Paying Living Expenses:   Food Insecurity:   . Worried About Charity fundraiser in the Last Year:   . Arboriculturist in the Last Year:   Transportation Needs:   . Film/video editor (Medical):   Marland Kitchen Lack of Transportation (Non-Medical):   Physical Activity:   . Days of Exercise per Week:   . Minutes of Exercise per Session:   Stress:   . Feeling of Stress :   Social Connections:   . Frequency of Communication with Friends and Family:   . Frequency of Social Gatherings with Friends and Family:   . Attends Religious Services:   . Active Member of Clubs or Organizations:   . Attends Archivist Meetings:   Marland Kitchen Marital Status:   Intimate Partner Violence:   . Fear of Current or Ex-Partner:   . Emotionally Abused:   Marland Kitchen Physically Abused:   . Sexually Abused:       PHYSICAL EXAM  Vitals:   06/27/19 1301  BP: 121/81  Pulse: 77  Temp: (!) 97.2 F (36.2 C)  Weight: 152 lb (68.9 kg)  Height: 5\' 3"  (1.6 m)   Body mass index is 26.93 kg/m.  Generalized: Well developed, in no acute distress  Cardiology: normal rate and rhythm, no murmur noted Neurological examination  Mentation: Alert oriented to time, place, history taking. Follows all commands speech and language fluent Cranial nerve  II-XII: Pupils were equal round reactive to light. Extraocular movements were full, visual field were full on confrontational test. Facial sensation and strength were normal. Uvula tongue midline. Head turning and shoulder shrug  were normal and symmetric. Motor: The motor testing reveals 5 over 5 strength of all 4 extremities. Good symmetric  motor tone is noted throughout.  Sensory: Sensory testing is intact to soft touch on all 4 extremities. No evidence of extinction is noted.  Coordination: Cerebellar testing reveals good finger-nose-finger and heel-to-shin bilaterally.  Gait and station: Gait is normal. Tandem gait is normal. Romberg is negative. No drift is seen.  Reflexes: Deep tendon reflexes are symmetric and normal bilaterally.   DIAGNOSTIC DATA (LABS, IMAGING, TESTING) - I reviewed patient records, labs, notes, testing and imaging myself where available.  No flowsheet data found.   Lab Results  Component Value Date   WBC 8.0 02/08/2019   HGB 13.7 02/08/2019   HCT 42.5 02/08/2019   MCV 94.4 02/08/2019   PLT 197 02/08/2019      Component Value Date/Time   NA 143 02/08/2019 1228   K 4.1 02/08/2019 1228   CL 112 (H) 02/08/2019 1228   CO2 20 (L) 02/08/2019 1228   GLUCOSE 113 (H) 02/08/2019 1228   BUN 12 02/08/2019 1228   CREATININE 0.74 02/08/2019 1228   CALCIUM 9.0 02/08/2019 1228   PROT 6.8 02/06/2019 1341   ALBUMIN 4.3 02/06/2019 1341   AST 19 02/06/2019 1341   ALT 14 02/06/2019 1341   ALKPHOS 65 02/06/2019 1341   BILITOT 0.2 (L) 02/06/2019 1341   GFRNONAA >60 02/08/2019 1228   GFRAA >60 02/08/2019 1228   No results found for: CHOL, HDL, LDLCALC, LDLDIRECT, TRIG, CHOLHDL No results found for: HGBA1C No results found for: VITAMINB12 No results found for: TSH     ASSESSMENT AND PLAN 69 y.o. year old female  has a past medical history of Anxiety, Arthritis, DVT (deep venous thrombosis) ([place]) (2008), Finger fracture, right, Major depressive disorder, Migraine, Seizure disorder ([place]), Thyroid disease, and Wears partial dentures. here with     ICD-10-CM   1. Partial symptomatic epilepsy with complex partial seizures, intractable, without status epilepticus ([place])  G40.219   2. Nonepileptic episode ([place])  R56.9   3. Restless leg syndrome  G25.81     She is doing well on topiramate and lacosamide. We  will continue current therapy. PCP is prescribing ropinirole and she reports RLS is well managed. She was encouraged to stay well hydrated and work on healthy lifestyle habits. She will follow up with me in 1 year, sooner if needed. She verbalizes understanding and agreement with this plan.    No orders of the defined types were placed in this encounter.    Meds ordered this encounter  Medications  . lacosamide (VIMPAT) 200 MG TABS tablet    Sig: Take 1 tablet (200 mg total) by mouth 2 (two) times daily.    Dispense:  60 tablet    Refill:  5    This request is for a new prescription for a controlled substance as required by Federal/State law.    Order Specific Question:   Supervising Provider    Answer:   Melvenia Beam [id number]  . topiramate (TOPAMAX) 100 MG tablet    Sig: Take 1 tablet (100 mg total) by mouth 2 (two) times daily.    Dispense:  180 tablet    Refill:  3    Order Specific Question:   Supervising Provider  AnswerMelvenia Beam [id number]  . topiramate (TOPAMAX) 25 MG tablet    Sig: Take 1 tablet (25 mg total) by mouth 2 (two) times daily.    Dispense:  180 tablet    Refill:  3    Order Specific Question:   Supervising Provider    Answer:   Melvenia Beam [id number]      I spent 15 minutes with the patient. 50% of this time was spent counseling and educating patient on plan of care and medications.    Debbora Presto, FNP-C 06/27/2019, 2:28 PM [facility] [address], [place], [place] [address] [phone]

## 2019-06-27 NOTE — Patient Instructions (Signed)
Continue topiramate, Vimpat and ropinirole as prescribed   Follow up in 1 year  Seizure, Adult A seizure is a sudden burst of abnormal electrical activity in the brain. Seizures usually last from 30 seconds to 2 minutes. They can cause many different symptoms. Usually, seizures are not harmful unless they last a long time. What are the causes? Common causes of this condition include:  Fever or infection.  Conditions that affect the brain, such as: ? A brain abnormality that you were born with. ? A brain or head injury. ? Bleeding in the brain. ? A tumor. ? Stroke. ? Brain disorders such as autism or cerebral palsy.  Low blood sugar.  Conditions that are passed from parent to child (are inherited).  Problems with substances, such as: ? Having a reaction to a drug or a medicine. ? Suddenly stopping the use of a substance (withdrawal). In some cases, the cause may not be known. A person who has repeated seizures over time without a clear cause has a condition called epilepsy. What increases the risk? You are more likely to get this condition if you have:  A family history of epilepsy.  Had a seizure in the past.  A brain disorder.  A history of head injury, lack of oxygen at birth, or strokes. What are the signs or symptoms? There are many types of seizures. The symptoms vary depending on the type of seizure you have. Examples of symptoms during a seizure include:  Shaking (convulsions).  Stiffness in the body.  Passing out (losing consciousness).  Head nodding.  Staring.  Not responding to sound or touch.  Loss of bladder control and bowel control. Some people have symptoms right before and right after a seizure happens. Symptoms before a seizure may include:  Fear.  Worry (anxiety).  Feeling like you may vomit (nauseous).  Feeling like the room is spinning (vertigo).  Feeling like you saw or heard something before (dj vu).  Odd tastes or  smells.  Changes in how you see. You may see flashing lights or spots. Symptoms after a seizure happens can include:  Confusion.  Sleepiness.  Headache.  Weakness on one side of the body. How is this treated? Most seizures will stop on their own in under 5 minutes. In these cases, no treatment is needed. Seizures that last longer than 5 minutes will usually need treatment. Treatment can include:  Medicines given through an IV tube.  Avoiding things that are known to cause your seizures. These can include medicines that you take for another condition.  Medicines to treat epilepsy.  Surgery to stop the seizures. This may be needed if medicines do not help. Follow these instructions at home: Medicines  Take over-the-counter and prescription medicines only as told by your doctor.  Do not eat or drink anything that may keep your medicine from working, such as alcohol. Activity  Do not do any activities that would be dangerous if you had another seizure, like driving or swimming. Wait until your doctor says it is safe for you to do them.  If you live in the [place] ask your local DMV (department of motor vehicles) when you can drive.  Get plenty of rest. Teaching others Teach friends and family what to do when you have a seizure. They should:  Lay you on the ground.  Protect your head and body.  Loosen any tight clothing around your neck.  Turn you on your side.  Not hold you down.  Not put  anything into your mouth.  Know whether or not you need emergency care.  Stay with you until you are better.  General instructions  Contact your doctor each time you have a seizure.  Avoid anything that gives you seizures.  Keep a seizure diary. Write down: ? What you think caused each seizure. ? What you remember about each seizure.  Keep all follow-up visits as told by your doctor. This is important. Contact a doctor if:  You have another seizure.  You have seizures  more often.  There is any change in what happens during your seizures.  You keep having seizures with treatment.  You have symptoms of being sick or having an infection. Get help right away if:  You have a seizure that: ? Lasts longer than 5 minutes. ? Is different than seizures you had before. ? Makes it harder to breathe. ? Happens after you hurt your head.  You have any of these symptoms after a seizure: ? Not being able to speak. ? Not being able to use a part of your body. ? Confusion. ? A bad headache.  You have two or more seizures in a row.  You do not wake up right after a seizure.  You get hurt during a seizure. These symptoms may be an emergency. Do not wait to see if the symptoms will go away. Get medical help right away. Call your local emergency services (911 in the [place] Do not drive yourself to the hospital. Summary  Seizures usually last from 30 seconds to 2 minutes. Usually, they are not harmful unless they last a long time.  Do not eat or drink anything that may keep your medicine from working, such as alcohol.  Teach friends and family what to do when you have a seizure.  Contact your doctor each time you have a seizure. This information is not intended to replace advice given to you by your health care provider. Make sure you discuss any questions you have with your health care provider. Document Revised: 06/17/2018 Document Reviewed: 06/17/2018 Elsevier Patient Education  [place].

## 2019-07-10 NOTE — Progress Notes (Signed)
I reviewed note and agree with plan.   Penni Bombard, MD [id number], 0000000 PM Certified in Neurology, Neurophysiology and Neuroimaging  [facility] [address] [place], [place] [address] [phone]

## 2019-07-21 ENCOUNTER — Other Ambulatory Visit: Payer: Self-pay

## 2019-07-21 ENCOUNTER — Encounter (HOSPITAL_COMMUNITY): Payer: Self-pay | Admitting: Emergency Medicine

## 2019-07-21 ENCOUNTER — Emergency Department (HOSPITAL_COMMUNITY)
Admission: EM | Admit: 2019-07-21 | Discharge: 2019-07-21 | Disposition: A | Discharge status: Home / Self Care | Payer: Medicare HMO | Attending: Emergency Medicine | Admitting: Emergency Medicine

## 2019-07-21 DIAGNOSIS — Z91010 Allergy to peanuts: Secondary | ICD-10-CM | POA: Insufficient documentation

## 2019-07-21 DIAGNOSIS — R55 Syncope and collapse: Secondary | ICD-10-CM | POA: Diagnosis present

## 2019-07-21 DIAGNOSIS — M25512 Pain in left shoulder: Secondary | ICD-10-CM | POA: Diagnosis not present

## 2019-07-21 DIAGNOSIS — R42 Dizziness and giddiness: Secondary | ICD-10-CM | POA: Insufficient documentation

## 2019-07-21 DIAGNOSIS — Z87891 Personal history of nicotine dependence: Secondary | ICD-10-CM | POA: Insufficient documentation

## 2019-07-21 DIAGNOSIS — Z79899 Other long term (current) drug therapy: Secondary | ICD-10-CM | POA: Insufficient documentation

## 2019-07-21 DIAGNOSIS — I10 Essential (primary) hypertension: Secondary | ICD-10-CM | POA: Diagnosis not present

## 2019-07-21 DIAGNOSIS — M79602 Pain in left arm: Secondary | ICD-10-CM | POA: Diagnosis not present

## 2019-07-21 LAB — CBC WITH DIFFERENTIAL/PLATELET
Abs Immature Granulocytes: 0.02 10*3/uL (ref 0.00–0.07)
Basophils Absolute: 0 10*3/uL (ref 0.0–0.1)
Basophils Relative: 1 %
Eosinophils Absolute: 0.1 10*3/uL (ref 0.0–0.5)
Eosinophils Relative: 2 %
HCT: 39.5 % (ref 36.0–46.0)
Hemoglobin: 12.6 g/dL (ref 12.0–15.0)
Immature Granulocytes: 0 %
Lymphocytes Relative: 23 %
Lymphs Abs: 1.4 10*3/uL (ref 0.7–4.0)
MCH: 30.6 pg (ref 26.0–34.0)
MCHC: 31.9 g/dL (ref 30.0–36.0)
MCV: 95.9 fL (ref 80.0–100.0)
Monocytes Absolute: 0.6 10*3/uL (ref 0.1–1.0)
Monocytes Relative: 10 %
Neutro Abs: 4 10*3/uL (ref 1.7–7.7)
Neutrophils Relative %: 64 %
Platelets: 188 10*3/uL (ref 150–400)
RBC: 4.12 MIL/uL (ref 3.87–5.11)
RDW: 12.1 % (ref 11.5–15.5)
WBC: 6.1 10*3/uL (ref 4.0–10.5)
nRBC: 0 % (ref 0.0–0.2)

## 2019-07-21 LAB — BASIC METABOLIC PANEL
Anion gap: 7 (ref 5–15)
BUN: 20 mg/dL (ref 8–23)
CO2: 26 mmol/L (ref 22–32)
Calcium: 8.8 mg/dL — ABNORMAL LOW (ref 8.9–10.3)
Chloride: 111 mmol/L (ref 98–111)
Creatinine, Ser: 0.74 mg/dL (ref 0.44–1.00)
GFR calc Af Amer: >60 mL/min (ref >60)
GFR calc non Af Amer: >60 mL/min (ref >60)
Glucose, Bld: 109 mg/dL — ABNORMAL HIGH (ref 70–99)
Potassium: 4 mmol/L (ref 3.5–5.1)
Sodium: 144 mmol/L (ref 135–145)

## 2019-07-21 LAB — CBG MONITORING, ED: Glucose-Capillary: 77 mg/dL (ref 70–99)

## 2019-07-21 LAB — TROPONIN I (HIGH SENSITIVITY)
Troponin I (High Sensitivity): 3 ng/L (ref <18)
Troponin I (High Sensitivity): 2 ng/L (ref <18)

## 2019-07-21 MED ORDER — SODIUM CHLORIDE 0.9 % IV BOLUS
500.0000 mL | Freq: Once | INTRAVENOUS | Status: AC
  Administered 2019-07-21: 500 mL via INTRAVENOUS

## 2019-07-21 NOTE — ED Provider Notes (Signed)
[facility] DEPT Provider Note   CSN: [id number] Arrival date & time: 07/21/19  [id number]     History Chief Complaint  Patient presents with  . Near Syncope    Teresa Nguyen is a 69 y.o. female.  Patient is a 69 year old female who presents with a near syncopal event.  She has a history of prior left leg DVT, seizure disorder, thyroid disease and prior syncope.  She reports that she was sleeping on her left side which she does not normally do.  She noticed that her shoulder was hurting.  She turned over and had more pain from her shoulder shooting down her arm.  She said it was a sharp intense pain.  She got up to start walking to the bathroom and the pain intensified.  She got dizzy and started seeing stars.  She felt like she might pass out and fell down onto her knees.  She did not hit her head.  There was no complete loss of consciousness.  She had no associated nausea or vomiting.  No chest pain or shortness of breath.  She says that the pain has subsequently resolved.  She says it was worse with movement at the time.  She has currently no neck or back pain.  No chest pain.  She has had no recent illnesses.  No fevers.  No cough or cold symptoms.  She has a history of partial seizures but has not had a seizure within about a year.  No recent seizure activity.  No recent head trauma.  No palpitations during this event.        Past Medical History:  Diagnosis Date  . Anxiety   . Arthritis   . DVT (deep venous thrombosis) ([place]) 2008   L leg  . Finger fracture, right    small  . Major depressive disorder    pt denies  . Migraine   . Seizure disorder [facility])    since age 40-3, last sz 12/31/15  . Thyroid disease   . Wears partial dentures     Patient Active Problem List   Diagnosis Date Noted  . Abnormal loss of weight 10/17/2018  . GERD (gastroesophageal reflux disease) 10/17/2018  . Change in stool 10/17/2018  . Seizure ([place]) 12/27/2017  . Frontal  lobe epilepsy ([place]) 01/01/2014  . Complex partial epilepsy ([place]) 12/29/2013  . Localization-related (focal) (partial) epilepsy and epileptic syndromes with complex partial seizures, without mention of intractable epilepsy 05/09/2013  . Migraine 02/21/2013  . Right lower quadrant pain 02/21/2013  . Hypertension 02/21/2013  . Stress at home 02/21/2013  . Restless leg syndrome 12/28/2012  . 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 BIOPSY  10/17/2018   Procedure: BIOPSY;  Surgeon: Wilford Corner, MD;  Location: [facility]COPY;  Service: Endoscopy;;  . BREAST LUMPECTOMY Bilateral 1990  . COLONOSCOPY WITH PROPOFOL N/A 10/17/2018   Procedure: COLONOSCOPY WITH PROPOFOL;  Surgeon: Wilford Corner, MD;  Location: [facility];  Service: Endoscopy;  Laterality: N/A;  . ESOPHAGOGASTRODUODENOSCOPY (EGD) WITH PROPOFOL N/A 10/17/2018   Procedure: ESOPHAGOGASTRODUODENOSCOPY (EGD) WITH PROPOFOL;  Surgeon: Wilford Corner, MD;  Location: [facility];  Service: Endoscopy;  Laterality: N/A;  . NASAL SINUS SURGERY    . POLYPECTOMY  10/17/2018   Procedure: POLYPECTOMY;  Surgeon: Wilford Corner, MD;  Location: [facility];  Service: Endoscopy;;  . TONSILLECTOMY    . TUBAL LIGATION       OB History  Gravida  4   Para      Term      Preterm      AB  1   Living  3     SAB      TAB      Ectopic      Multiple      Live Births  4           Family History  Problem Relation Age of Onset  . Stroke Father   . Heart attack Father   . Throat cancer Sister   . Seizures Sister   . Migraines Sister   . Diabetes Maternal Grandmother   . Diabetes Maternal Grandfather   . Kidney Stones Sister     Social History   Tobacco Use  . Smoking status: Former Smoker    Quit date: 04/16/1998    Years since quitting: 21.2  . Smokeless tobacco: Never Used  Substance Use Topics  . Alcohol use: Yes    Comment: occ. social  . Drug use:  Not Currently    Home Medications Prior to Admission medications   Medication Sig Start Date End Date Taking? Authorizing Provider  acetaminophen (TYLENOL) 500 MG tablet Take 500 mg by mouth 2 (two) times daily as needed for moderate pain or headache.   Yes [provider]  amLODipine (NORVASC) 5 MG tablet Take 1 tablet (5 mg total) by mouth daily. 12/29/17  Yes Dana Allan I, MD  atorvastatin (LIPITOR) 10 MG tablet Take 10 mg by mouth at bedtime.  05/04/13  Yes [provider]  Calcium Carbonate-Vitamin D (CALCIUM-D PO) Take 2 tablets by mouth daily.    Yes [provider]  Carboxymethylcellulose Sodium (REFRESH LIQUIGEL OP) Place 1 drop into both eyes daily as needed (dry eyes).   Yes [provider]  Desvenlafaxine Succinate ER 25 MG TB24 Take 25 mg by mouth daily.  01/11/18  Yes [provider]  diazepam (VALIUM) 10 MG tablet Take 10 mg by mouth daily as needed (seizure).   Yes [provider]  diclofenac sodium (VOLTAREN) 1 % GEL Apply 1 application topically at bedtime.  02/07/18  Yes [provider]  fluticasone (FLONASE) 50 MCG/ACT nasal spray Place 2 sprays into both nostrils at bedtime as needed for allergies.    Yes [provider]  lacosamide (VIMPAT) 200 MG TABS tablet Take 1 tablet (200 mg total) by mouth 2 (two) times daily. 06/27/19  Yes Lomax, Amy, NP  levothyroxine (SYNTHROID, LEVOTHROID) 25 MCG tablet Take 25 mcg by mouth daily before breakfast.  05/04/13  Yes [provider]  loperamide (IMODIUM A-D) 2 MG tablet Take 1 mg by mouth 4 (four) times daily as needed for diarrhea or loose stools.   Yes [provider]  methocarbamol (ROBAXIN) 750 MG tablet Take 1 tablet (750 mg total) by mouth 2 (two) times daily as needed for muscle spasms. Patient taking differently: Take 750 mg by mouth at bedtime.  12/28/17  Yes Bonnell Public, MD  Multiple Vitamins-Minerals (MULTIVITAMIN ADULT) TABS  Take 1 tablet by mouth daily.   Yes [provider]  polyethylene glycol (MIRALAX / GLYCOLAX) packet Take 4.25 g by mouth daily.    Yes [provider]  rOPINIRole (REQUIP) 1 MG tablet Take 1 tablet (1 mg total) by mouth daily. 12/29/17  Yes Dana Allan I, MD  sodium chloride (OCEAN) 0.65 % SOLN nasal spray Place 1 spray into both nostrils as needed for congestion.  Yes [provider]  topiramate (TOPAMAX) 100 MG tablet Take 1 tablet (100 mg total) by mouth 2 (two) times daily. 06/27/19  Yes Lomax, Amy, NP  topiramate (TOPAMAX) 25 MG tablet Take 1 tablet (25 mg total) by mouth 2 (two) times daily. 06/27/19  Yes Lomax, Amy, NP  vitamin E 400 UNIT capsule Take 400 Units by mouth at bedtime.   Yes [provider]    Allergies    Peanut oil, Other, and Sulfa antibiotics  Review of Systems   Review of Systems  Constitutional: Negative for chills, diaphoresis, fatigue and fever.  HENT: Negative for congestion, rhinorrhea and sneezing.   Eyes: Negative.   Respiratory: Negative for cough, chest tightness and shortness of breath.   Cardiovascular: Negative for chest pain and leg swelling.  Gastrointestinal: Negative for abdominal pain, blood in stool, diarrhea, nausea and vomiting.  Genitourinary: Negative for difficulty urinating, flank pain, frequency and hematuria.  Musculoskeletal: Positive for arthralgias. Negative for back pain.  Skin: Negative for rash.  Neurological: Positive for syncope (Near syncope). Negative for dizziness, speech difficulty, weakness, numbness and headaches.    Physical Exam Updated Vital Signs BP 125/71   Pulse 66   Temp 98 F (36.7 C) (Oral)   Resp (!) 35   Ht 5\' 3"  (1.6 m)   Wt 66.7 kg   SpO2 97%   BMI 26.04 kg/m   Physical Exam Constitutional:      Appearance: She is well-developed.  HENT:     Head: Normocephalic and atraumatic.  Eyes:     Pupils: Pupils are equal, round, and reactive to light.    Cardiovascular:     Rate and Rhythm: Normal rate and regular rhythm.     Heart sounds: Normal heart sounds.  Pulmonary:     Effort: Pulmonary effort is normal. No respiratory distress.     Breath sounds: Normal breath sounds. No wheezing or rales.  Chest:     Chest wall: No tenderness.  Abdominal:     General: Bowel sounds are normal.     Palpations: Abdomen is soft.     Tenderness: There is no abdominal tenderness. There is no guarding or rebound.  Musculoskeletal:        General: Normal range of motion.     Cervical back: Normal range of motion and neck supple.     Comments: No pain along the cervical spine or trapezius muscles.  No pain on palpation or range of motion the shoulder or left arm.  Radial pulses are intact.  She has normal sensation and motor function distally  Lymphadenopathy:     Cervical: No cervical adenopathy.  Skin:    General: Skin is warm and dry.     Findings: No rash.  Neurological:     Mental Status: She is alert and oriented to person, place, and time.     Comments: Motor 5/5 all extremities Sensation grossly intact to LT all extremities Finger to Nose intact, no pronator drift CN II-XII grossly intact       ED Results / Procedures / Treatments   Labs (all labs ordered are listed, but only abnormal results are displayed) Labs Reviewed  BASIC METABOLIC PANEL - Abnormal; Notable for the following components:      Result Value   Glucose, Bld 109 (*)    Calcium 8.8 (*)    All other components within normal limits  CBC WITH DIFFERENTIAL/PLATELET  CBG MONITORING, ED  TROPONIN I (HIGH SENSITIVITY)  TROPONIN I (HIGH  SENSITIVITY)    EKG EKG Interpretation  Date/Time:  Friday July 21 2019 09:18:26 EDT Ventricular Rate:  60 PR Interval:    QRS Duration: 97 QT Interval:  422 QTC Calculation: 422 R Axis:   -18 Text Interpretation: Sinus rhythm Borderline left axis deviation Low voltage, precordial leads since last tracing no significant  change Confirmed by Malvin Johns [phone]) on 07/21/2019 11:07:06 AM   Radiology No results found.  Procedures Procedures (including critical care time)  Medications Ordered in ED Medications  sodium chloride 0.9 % bolus 500 mL (0 mLs Intravenous Stopped 07/21/19 1306)    ED Course  I have reviewed the triage vital signs and the nursing notes.  Pertinent labs & imaging results that were available during my care of the patient were reviewed by me and considered in my medical decision making (see chart for details).    MDM Rules/Calculators/A&P                      Patient is a 69 year old female who presents after near syncopal event.  It started after some sharp pain in her arm.  Her arm pain has resolved.  She has no chest pain.  No shortness of breath.  She has no neurologic deficits.  She is able to ambulate without ataxia in the ED.  She has no dizziness.  She is completely symptom-free now.  She did not have any of her typical seizure-like activity.  Her labs are nonconcerning.  Her respiratory rate is noted to be elevated on documented vital signs although I do not appreciate this on exam.  She does not have any reports of shortness of breath or increased work of breathing.  No visualized tachypnea on my exam.  She was discharged home in good condition.  She was encouraged to have a follow-up appointment with her PCP.  Return precautions were given. Final Clinical Impression(s) / ED Diagnoses Final diagnoses:  Near syncope    Rx / DC Orders ED Discharge Orders    None       Malvin Johns, MD 07/21/19 1521

## 2019-07-21 NOTE — Discharge Instructions (Signed)
You were seen here for an episode of near syncope.  This likely was related from a vasovagal episode.  Your labs look okay.  You need to follow-up with your primary care doctor for recheck.  Return here as needed for any worsening symptoms.

## 2019-07-21 NOTE — ED Notes (Signed)
Pt ambulated to bathroom with no help.

## 2019-07-21 NOTE — ED Triage Notes (Signed)
Patient arrived by EMS from home. Patient has one syncopal episode. Patient denies LOC.   Hx of syncope, seizures, and HTN.

## 2019-07-24 ENCOUNTER — Telehealth: Payer: Self-pay | Admitting: Family Medicine

## 2019-07-24 NOTE — Telephone Encounter (Signed)
Noted. I have reviewed ER note. I agree with PCP follow up. Could have been related to BP or heart rate/rhythm. Does not sound like seizure. No LOC or typical symptoms.

## 2019-07-24 NOTE — Telephone Encounter (Signed)
I called pt and she relayed that her visit to ED fpr pre syncope spell (she relayed a multitude of sx : balance off, visio, sleepier, breathing is faster, neck hurts hard to turn, small sharp headache pains, has bump on her head.  Initially did not think hit her head, but she may have.  She has tried to contact her pcp, I relayed to perservere in contacting him.  She wanted AL/NP to know.

## 2019-07-24 NOTE — Telephone Encounter (Signed)
Pt called requesting a CB from RN to discuss ED visit with "near syncope"

## 2019-07-25 NOTE — Telephone Encounter (Signed)
Noted  

## 2019-08-01 ENCOUNTER — Other Ambulatory Visit: Payer: Self-pay

## 2019-08-01 ENCOUNTER — Ambulatory Visit (INDEPENDENT_AMBULATORY_CARE_PROVIDER_SITE_OTHER): Payer: Medicare HMO | Admitting: Sports Medicine

## 2019-08-01 ENCOUNTER — Encounter: Payer: Self-pay | Admitting: Sports Medicine

## 2019-08-01 VITALS — Temp 97.9°F

## 2019-08-01 DIAGNOSIS — M79674 Pain in right toe(s): Secondary | ICD-10-CM | POA: Diagnosis not present

## 2019-08-01 DIAGNOSIS — L600 Ingrowing nail: Secondary | ICD-10-CM | POA: Diagnosis not present

## 2019-08-01 MED ORDER — NEOMYCIN-POLYMYXIN-HC 3.5-10000-1 OT SOLN: OTIC | 0 refills | Status: DC

## 2019-08-01 NOTE — [place] Notes (Signed)
Subjective: Teresa Nguyen is a 69 y.o. female patient presents to office today complaining of a moderately painful incurvated medial nail border of the 1st toe on the right foot. This has been present for 1 year. Patient has treated this by pedicures. Patient denies fever/chills/nausea/vomitting/any other related constitutional symptoms at this time.  Patient Active Problem List   Diagnosis Date Noted  . Abnormal loss of weight 10/17/2018  . GERD (gastroesophageal reflux disease) 10/17/2018  . Change in stool 10/17/2018  . Seizure ([place]) 12/27/2017  . Frontal lobe epilepsy ([place]) 01/01/2014  . Complex partial epilepsy ([place]) 12/29/2013  . Localization-related (focal) (partial) epilepsy and epileptic syndromes with complex partial seizures, without mention of intractable epilepsy 05/09/2013  . Migraine 02/21/2013  . Right lower quadrant pain 02/21/2013  . Hypertension 02/21/2013  . Stress at home 02/21/2013  . Restless leg syndrome 12/28/2012  . 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 acetaminophen (TYLENOL) 500 MG tablet Take 500 mg by mouth 2 (two) times daily as needed for moderate pain or headache.    Marland Kitchen amLODipine (NORVASC) 5 MG tablet Take 1 tablet (5 mg total) by mouth daily. 30 tablet 0  . atorvastatin (LIPITOR) 10 MG tablet Take 10 mg by mouth at bedtime.     . Calcium Carbonate-Vitamin D (CALCIUM-D PO) Take 2 tablets by mouth daily.     . Carboxymethylcellulose Sodium (REFRESH LIQUIGEL OP) Place 1 drop into both eyes daily as needed (dry eyes).    . Desvenlafaxine Succinate ER 25 MG TB24 Take 25 mg by mouth daily.   1  . diazepam (VALIUM) 10 MG tablet Take 10 mg by mouth daily as needed (seizure).    . diclofenac sodium (VOLTAREN) 1 % GEL Apply 1 application topically at bedtime.     . fluticasone (FLONASE) 50 MCG/ACT nasal spray Place 2 sprays into both nostrils at bedtime as needed  for allergies.     Marland Kitchen lacosamide (VIMPAT) 200 MG TABS tablet Take 1 tablet (200 mg total) by mouth 2 (two) times daily. 60 tablet 5  . levothyroxine (SYNTHROID, LEVOTHROID) 25 MCG tablet Take 25 mcg by mouth daily before breakfast.     . loperamide (IMODIUM A-D) 2 MG tablet Take 1 mg by mouth 4 (four) times daily as needed for diarrhea or loose stools.    . meloxicam (MOBIC) 7.5 MG tablet     . methocarbamol (ROBAXIN) 750 MG tablet Take 1 tablet (750 mg total) by mouth 2 (two) times daily as needed for muscle spasms. (Patient taking differently: Take 750 mg by mouth at bedtime. ) 20 tablet 0  . Multiple Vitamins-Minerals (MULTIVITAMIN ADULT) TABS Take 1 tablet by mouth daily.    . polyethylene glycol (MIRALAX / GLYCOLAX) packet Take 4.25 g by mouth daily.     Marland Kitchen rOPINIRole (REQUIP) 1 MG tablet Take 1 tablet (1 mg total) by mouth daily. 30 tablet 0  . sodium chloride (OCEAN) 0.65 % SOLN nasal spray Place 1 spray into both nostrils as needed for congestion.    . topiramate (TOPAMAX) 100 MG tablet Take 1 tablet (100 mg total) by mouth 2 (two) times daily. 180 tablet 3  . topiramate (TOPAMAX) 25 MG tablet Take 1 tablet (25 mg total) by mouth 2 (two) times daily. 180 tablet 3  . vitamin E 400 UNIT capsule Take 400 Units by mouth at bedtime.     No current facility-administered medications on  file prior to visit.    Allergies  Allergen Reactions  . Peanut Oil Diarrhea    Nuts in general   . Other Diarrhea    Nuts and dark green vegetables cause diarrhea and dehydration  . Sulfa Antibiotics Itching and Rash    Red all over    Objective:  There were no vitals filed for this visit.  General: Well developed, nourished, in no acute distress, alert and oriented x3   Dermatology: Skin is warm, dry and supple bilateral. Right hallux nail appears to be mildly incurvated with hyperkeratosis formation at the distal aspects of  the medial nail border. (-) Erythema. (+) Edema. (-) serosanguous  drainage  present. The remaining nails appear unremarkable at this time. There are no open sores, lesions or other signs of infection present.  Vascular: Dorsalis Pedis artery 2/4 and Posterior Tibial artery pedal pulses are 1/4 bilateral with immedate capillary fill time. Pedal hair growth present. No lower extremity edema.   Neruologic: Grossly intact via light touch bilateral.  Musculoskeletal: Tenderness to palpation of the Right hallux medial nail fold. Muscular strength within normal limits in all groups bilateral.   Assesement and Plan: Problem List Items Addressed This Visit    None    Visit Diagnoses    Ingrown nail    -  Primary   Toe pain, right          -Discussed treatment alternatives and plan of care; Explained permanent/temporary nail avulsion and post procedure course to patient. Patient elects for R PNA - After a verbal and written consent, injected 3 ml of a 50:50 mixture of 2% plain  lidocaine and 0.5% plain marcaine in a normal hallux block fashion. Next, a  betadine prep was performed. Anesthesia was tested and found to be appropriate.  The offending Right hallux medial nail border was then incised from the hyponychium to the epinychium. The offending nail border was removed and cleared from the field. The area was curretted for any remaining nail or spicules. Phenol application performed and the area was then flushed with alcohol and dressed with antibiotic cream and a dry sterile dressing. -Patient was instructed to leave the dressing intact for today and begin soaking  in a weak solution of betadine or Epsom salt and water tomorrow. Patient was instructed to  soak for 15-20 minutes each day and apply neosporin/corticosporin and a gauze or bandaid dressing each day. -Patient was instructed to monitor the toe for signs of infection and return to office if toe becomes red, hot or swollen. -Advised ice, elevation, and tylenol or motrin if needed for pain.  -Patient is to return  in 2 weeks for follow up care/nail check or sooner if problems arise.  Landis Martins, DPM

## 2019-08-15 ENCOUNTER — Ambulatory Visit (INDEPENDENT_AMBULATORY_CARE_PROVIDER_SITE_OTHER): Payer: Medicare HMO | Admitting: Sports Medicine

## 2019-08-15 ENCOUNTER — Encounter: Payer: Self-pay | Admitting: Sports Medicine

## 2019-08-15 ENCOUNTER — Other Ambulatory Visit: Payer: Self-pay

## 2019-08-15 VITALS — Temp 97.8°F

## 2019-08-15 DIAGNOSIS — Z9889 Other specified postprocedural states: Secondary | ICD-10-CM

## 2019-08-15 DIAGNOSIS — M79674 Pain in right toe(s): Secondary | ICD-10-CM

## 2019-08-15 NOTE — [place] Notes (Signed)
Subjective: Teresa Nguyen is a 69 y.o. female patient returns to office today for follow up evaluation after having Right Hallux medial permanent nail avulsion performed on (08-01-19). Patient has been soaking using epsom salt and applying topical antibiotic covered with bandaid daily. Reports no pain and feels good. Patient denies fever/chills/nausea/vomitting/any other related constitutional symptoms at this time.  Patient Active Problem List   Diagnosis Date Noted  . Abnormal loss of weight 10/17/2018  . GERD (gastroesophageal reflux disease) 10/17/2018  . Change in stool 10/17/2018  . Seizure ([place]) 12/27/2017  . Frontal lobe epilepsy ([place]) 01/01/2014  . Complex partial epilepsy ([place]) 12/29/2013  . Localization-related (focal) (partial) epilepsy and epileptic syndromes with complex partial seizures, without mention of intractable epilepsy 05/09/2013  . Migraine 02/21/2013  . Right lower quadrant pain 02/21/2013  . Hypertension 02/21/2013  . Stress at home 02/21/2013  . Restless leg syndrome 12/28/2012  . 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 acetaminophen (TYLENOL) 500 MG tablet Take 500 mg by mouth 2 (two) times daily as needed for moderate pain or headache.    Marland Kitchen amLODipine (NORVASC) 5 MG tablet Take 1 tablet (5 mg total) by mouth daily. 30 tablet 0  . atorvastatin (LIPITOR) 10 MG tablet Take 10 mg by mouth at bedtime.     . Calcium Carbonate-Vitamin D (CALCIUM-D PO) Take 2 tablets by mouth daily.     . Carboxymethylcellulose Sodium (REFRESH LIQUIGEL OP) Place 1 drop into both eyes daily as needed (dry eyes).    . Desvenlafaxine Succinate ER 25 MG TB24 Take 25 mg by mouth daily.   1  . diazepam (VALIUM) 10 MG tablet Take 10 mg by mouth daily as needed (seizure).    . diclofenac sodium (VOLTAREN) 1 % GEL Apply 1 application topically at bedtime.     . fluticasone (FLONASE) 50 MCG/ACT  nasal spray Place 2 sprays into both nostrils at bedtime as needed for allergies.     Marland Kitchen lacosamide (VIMPAT) 200 MG TABS tablet Take 1 tablet (200 mg total) by mouth 2 (two) times daily. 60 tablet 5  . levothyroxine (SYNTHROID, LEVOTHROID) 25 MCG tablet Take 25 mcg by mouth daily before breakfast.     . loperamide (IMODIUM A-D) 2 MG tablet Take 1 mg by mouth 4 (four) times daily as needed for diarrhea or loose stools.    . meloxicam (MOBIC) 7.5 MG tablet     . methocarbamol (ROBAXIN) 750 MG tablet Take 1 tablet (750 mg total) by mouth 2 (two) times daily as needed for muscle spasms. (Patient taking differently: Take 750 mg by mouth at bedtime. ) 20 tablet 0  . Multiple Vitamins-Minerals (MULTIVITAMIN ADULT) TABS Take 1 tablet by mouth daily.    Marland Kitchen neomycin-polymyxin-hydrocortisone (CORTISPORIN) OTIC solution Apply 1 to 2 drops to toe BID after soaking 10 mL 0  . polyethylene glycol (MIRALAX / GLYCOLAX) packet Take 4.25 g by mouth daily.     Marland Kitchen rOPINIRole (REQUIP) 1 MG tablet Take 1 tablet (1 mg total) by mouth daily. 30 tablet 0  . sodium chloride (OCEAN) 0.65 % SOLN nasal spray Place 1 spray into both nostrils as needed for congestion.    . topiramate (TOPAMAX) 100 MG tablet Take 1 tablet (100 mg total) by mouth 2 (two) times daily. 180 tablet 3  . topiramate (TOPAMAX) 25 MG tablet Take 1 tablet (25 mg total) by mouth 2 (two) times daily. [place]  tablet 3  . vitamin E 400 UNIT capsule Take 400 Units by mouth at bedtime.     No current facility-administered medications on file prior to visit.    Allergies  Allergen Reactions  . Peanut Oil Diarrhea    Nuts in general   . Other Diarrhea    Nuts and dark green vegetables cause diarrhea and dehydration  . Sulfa Antibiotics Itching and Rash    Red all over    Objective:  General: Well developed, nourished, in no acute distress, alert and oriented x3   Dermatology: Skin is warm, dry and supple bilateral. Right hallux medial nail bed appears to be  clean, dry, with mild granular tissue and surrounding eschar/scab. (-) Erythema. (-) Edema. (-) serosanguous drainage present. The remaining nails appear unremarkable at this time. There are no other lesions or other signs of infection  present.  Neurovascular status: Intact. No lower extremity swelling; No pain with calf compression bilateral.  Musculoskeletal: Decreased tenderness to palpation of the Right hallux nail fold. Muscular strength within normal limits bilateral.   Assesement and Plan: Problem List Items Addressed This Visit    None    Visit Diagnoses    S/P nail surgery    -  Primary   Toe pain, right          -Examined patient  -Cleansed right hallux medial nail fold and gently scrubbed with peroxide and q-tip/curetted away eschar at site and applied antibiotic cream covered with bandaid.  -Discussed plan of care with patient. -May d/c soaking -May leave open to air -Educated patient on long term care after nail surgery. -Patient was instructed to monitor the toe for reoccurrence and signs of infection; Patient advised to return to office or go to ER if toe becomes red, hot or swollen. -Patient is to return as needed or sooner if problems arise.  Landis Martins, DPM

## 2019-12-06 ENCOUNTER — Other Ambulatory Visit: Payer: Self-pay | Admitting: *Deleted

## 2019-12-06 MED ORDER — LACOSAMIDE 200 MG PO TABS: 200.0000 mg | ORAL_TABLET | Freq: Two times a day (BID) | ORAL | 1 refills | Status: DC

## 2019-12-06 NOTE — Telephone Encounter (Signed)
Received request from [facility] for 90-day supply x 1 of Vimpat 200mg  tablets. Pt last seen 07/07/19 and is followed yearly. [place] narcotic registry checked. Last filled for #60 on 11/02/19.

## 2020-01-01 ENCOUNTER — Other Ambulatory Visit: Payer: Self-pay

## 2020-01-01 ENCOUNTER — Ambulatory Visit (INDEPENDENT_AMBULATORY_CARE_PROVIDER_SITE_OTHER): Payer: Medicare HMO | Admitting: Podiatry

## 2020-01-01 DIAGNOSIS — M5432 Sciatica, left side: Secondary | ICD-10-CM

## 2020-01-01 DIAGNOSIS — L905 Scar conditions and fibrosis of skin: Secondary | ICD-10-CM

## 2020-01-01 DIAGNOSIS — I83812 Varicose veins of left lower extremities with pain: Secondary | ICD-10-CM

## 2020-01-01 DIAGNOSIS — G5752 Tarsal tunnel syndrome, left lower limb: Secondary | ICD-10-CM

## 2020-01-01 DIAGNOSIS — M5417 Radiculopathy, lumbosacral region: Secondary | ICD-10-CM | POA: Diagnosis not present

## 2020-01-01 DIAGNOSIS — R52 Pain, unspecified: Secondary | ICD-10-CM | POA: Diagnosis not present

## 2020-01-01 MED ORDER — LIDOCAINE-PRILOCAINE 2.5-2.5 % EX CREA: 1.0000 "application " | TOPICAL_CREAM | CUTANEOUS | 2 refills | Status: DC | PRN

## 2020-01-01 NOTE — Progress Notes (Signed)
  Subjective:  Patient ID: Teresa Nguyen, female    DOB: 23-May-1950,  MRN: [id number]  Chief Complaint  Patient presents with  . Foot Pain    bilateral foot pain, worse in left foot. feels like shes walking on rocks    69 y.o. female presents with the above complaint. History confirmed with patient.  Has abnormal sensation psych she is walking on pebbles on the left foot.  She does have a history of spine issues and sciatica.  She sees a neurologist for seizure disorder.  She has a history of left ankle fracture and laceration as a child which was treated nonoperatively  Objective:  Physical Exam: warm, good capillary refill, no trophic changes or ulcerative lesions and normal DP and PT pulses.  There is fullness in the tarsal tunnel and posterior to the medial malleolus.  Multiple varicosities in this region as well.  She has a positive Tinel's sign here that radiates into the distal foot.  Positive straight leg raise on this side.   Assessment:   1. Tarsal tunnel syndrome, left   2. Varicose veins of left lower extremity with pain   3. Painful scar   4. Lumbosacral radiculitis   5. Sciatica of left side      Plan:  Patient was evaluated and treated and all questions answered.   -Reviewed her symptoms and discussed possible etiologies with her.  Possible this is secondary to lumbosacral radiculopathy and sciatica versus primary tarsal tunnel syndrome secondary to varicosities causing a mass-effect in the tarsal tunnel, or entrapment of the nerve from her previous scar.  Regardless I would like to order an MRI and EMG/NCV to evaluate for both these possibilities.  We also discussed possible treatment options, she would prefer to avoid any medications orally right now (gabapentin not tolerable for her).  I prescribed her lidocaine prilocaine cream for symptomatic relief temporarily.  Referral to be sent to [facility] for consultation and EMG/NCV currently she still  sees a neurologist there for seizure disorder.  MRI will be sent to consider imaging.  Return in about 2 months (around 03/02/2020).

## 2020-01-11 ENCOUNTER — Ambulatory Visit (INDEPENDENT_AMBULATORY_CARE_PROVIDER_SITE_OTHER): Payer: Medicare HMO | Admitting: Diagnostic Neuroimaging

## 2020-01-11 ENCOUNTER — Other Ambulatory Visit: Payer: Self-pay

## 2020-01-11 ENCOUNTER — Encounter (INDEPENDENT_AMBULATORY_CARE_PROVIDER_SITE_OTHER): Payer: Medicare HMO | Admitting: Diagnostic Neuroimaging

## 2020-01-11 DIAGNOSIS — G5752 Tarsal tunnel syndrome, left lower limb: Secondary | ICD-10-CM | POA: Diagnosis not present

## 2020-01-11 DIAGNOSIS — M5417 Radiculopathy, lumbosacral region: Secondary | ICD-10-CM

## 2020-01-11 DIAGNOSIS — M5432 Sciatica, left side: Secondary | ICD-10-CM

## 2020-01-11 DIAGNOSIS — Z0289 Encounter for other administrative examinations: Secondary | ICD-10-CM

## 2020-01-11 NOTE — Procedures (Signed)
[facility]  NCS (NERVE CONDUCTION STUDY) WITH EMG (ELECTROMYOGRAPHY) REPORT   STUDY DATE: 01/11/20 PATIENT NAME: Teresa Nguyen DOB: Nov 12, 1950 MRN: [id number]  ORDERING CLINICIAN: Andrey Spearman, MD / Lanae Crumbly, MD   TECHNOLOGIST: Sherre Scarlet ELECTROMYOGRAPHER: Earlean Polka. Aureliano Oshields, MD  CLINICAL INFORMATION: 69 year old female with left foot pain. Evaluate for tarsal tunnel syndrome.   FINDINGS: NERVE CONDUCTION STUDY:  Left peroneal and left tibial motor responses are normal.  Left sural and left superficial peroneal sensory responses are normal.  Left medial and lateral plantar nerve responses could not be obtained.  Right medial and lateral plantar nerve responses are normal.  Left tibial F-wave latency is normal.   NEEDLE ELECTROMYOGRAPHY:  Needle examination of the left lower extremity and lumbar paraspinal muscles is normal.   IMPRESSION:   Mildly abnormal study demonstrating: -Absent left medial and lateral plantar nerve responses; normal on the right side.  Remainder of nerve conduction studies and needle EMG are unremarkable.  Findings can be seen with possible left tarsal tunnel syndrome vs technical artifact due to ankle swelling.    INTERPRETING PHYSICIAN:  Penni Bombard, MD Certified in Neurology, Neurophysiology and Neuroimaging  [facility] [address], [place], [place] [address] [phone]  [facility]    Nerve / Sites Muscle Latency Ref. Amplitude Ref. Rel Amp Segments Distance Velocity Ref. Area    ms ms mV mV %  cm m/s m/s mVms  L Peroneal - EDB     Ankle EDB 4.6 ?6.5 2.7 ?2.0 100 Ankle - EDB 9   13.0     Fib head EDB 10.8  2.4  87.8 Fib head - Ankle 27 44 ?44 12.2     Pop fossa EDB 13.1  2.5  105 Pop fossa - Fib head 10 44 ?44 12.7         Pop fossa - Ankle      L Tibial - AH     Ankle AH 3.8 ?5.8 4.9 ?4.0 100 Ankle - AH 9   14.5     Pop fossa AH 12.6  4.0  82.1 Pop fossa - Ankle 36  41 ?41 16.0         SNC    Nerve / Sites Rec. Site Peak Lat Ref.  Amp Ref. Segments Distance Peak Diff Ref.    ms ms V V  cm ms ms  L Sural - Ankle (Calf)     Calf Ankle 3.9 ?4.4 10 ?6 Calf - Ankle 14    L Superficial peroneal - Ankle     Lat leg Ankle 3.8 ?4.4 7 ?6 Lat leg - Ankle 14    L Medial plantar, Lateral plantar - Ankle (Medial, lateral sole)     Medial plantar Sole Ankle NR ?3.7 NR ?3 Medial plantar Sole - Ankle 14       Lateral plantar Sole Ankle NR ?3.7 NR ?3 Lateral plantar Sole - Ankle 14          Medial plantar Sole - Lateral plantar Sole  NR ?0.0  R Medial plantar, Lateral plantar - Ankle (Medial, lateral sole)     Medial plantar Sole Ankle 3.5 ?3.7 4 ?3 Medial plantar Sole - Ankle 14       Lateral plantar Sole Ankle 3.7 ?3.7 9 ?3 Lateral plantar Sole - Ankle 14          Medial plantar Sole - Lateral plantar Sole  -0.2 ?0.0  F  Wave    Nerve F Lat Ref.   ms ms  L Tibial - AH 55.6 ?56.0       EMG Summary Table    Spontaneous MUAP Recruitment  Muscle IA Fib PSW Fasc Other Amp Dur. Poly Pattern  L. Vastus medialis Normal None None None _______ Normal Normal Normal Normal  L. Tibialis anterior Normal None None None _______ Normal Normal Normal Normal  L. Tibialis posterior Normal None None None _______ Normal Normal Normal Normal  L. Gastrocnemius (Medial head) Normal None None None _______ Normal Normal Normal Normal  L. Abductor hallucis Normal None None None _______ Normal Normal Normal Normal  L. Lumbar paraspinals (low) Normal None None None _______ Normal Normal Normal Normal

## 2020-02-05 ENCOUNTER — Telehealth: Payer: Self-pay

## 2020-02-05 NOTE — Telephone Encounter (Signed)
Called [employer] to complete prior authorization for MRI of left heel. MRI was approved.  Authorization #[id number]     Valid Through: 02/06/20-03/07/20

## 2020-02-06 ENCOUNTER — Other Ambulatory Visit: Payer: Self-pay

## 2020-02-06 ENCOUNTER — Ambulatory Visit
Admission: RE | Admit: 2020-02-06 | Discharge: 2020-02-06 | Disposition: A | Discharge status: Home / Self Care | Payer: Medicare HMO | Source: Ambulatory Visit | Attending: Podiatry | Admitting: Podiatry

## 2020-02-06 DIAGNOSIS — I83812 Varicose veins of left lower extremities with pain: Secondary | ICD-10-CM

## 2020-02-06 DIAGNOSIS — G5752 Tarsal tunnel syndrome, left lower limb: Secondary | ICD-10-CM

## 2020-02-06 MED ORDER — GADOBENATE DIMEGLUMINE 529 MG/ML IV SOLN
14.0000 mL | Freq: Once | INTRAVENOUS | Status: AC | PRN
  Administered 2020-02-06: 14 mL via INTRAVENOUS

## 2020-02-11 ENCOUNTER — Other Ambulatory Visit: Payer: Medicare HMO

## 2020-02-22 ENCOUNTER — Ambulatory Visit (INDEPENDENT_AMBULATORY_CARE_PROVIDER_SITE_OTHER): Payer: Medicare HMO | Admitting: Podiatry

## 2020-02-22 ENCOUNTER — Other Ambulatory Visit: Payer: Self-pay

## 2020-02-22 DIAGNOSIS — I83812 Varicose veins of left lower extremities with pain: Secondary | ICD-10-CM | POA: Diagnosis not present

## 2020-02-22 DIAGNOSIS — M5417 Radiculopathy, lumbosacral region: Secondary | ICD-10-CM | POA: Diagnosis not present

## 2020-02-22 DIAGNOSIS — G5752 Tarsal tunnel syndrome, left lower limb: Secondary | ICD-10-CM | POA: Diagnosis not present

## 2020-02-25 ENCOUNTER — Encounter: Payer: Self-pay | Admitting: Podiatry

## 2020-02-25 NOTE — Progress Notes (Signed)
  Subjective:  Patient ID: Teresa Nguyen, female    DOB: 09-29-1950,  MRN: [id number]  Chief Complaint  Patient presents with  . Foot Pain    Bilateral foot pain. PT stated that she is doing good the pain is gone. Her concern now is that she feels a burning sensation in the heels when she is sitiing and doesnt have on socks.    69 y.o. female returns with the above complaint. History confirmed with patient. Completed MRI and EMG/NCV  Objective:  Physical Exam: warm, good capillary refill, no trophic changes or ulcerative lesions and normal DP and PT pulses.  Less tenderness in the tarsal tunnel and plantar foot now. Most of the pain is in the heels depending how she sits in her chair   Assessment:   1. Tarsal tunnel syndrome, left   2. Varicose veins of left lower extremity with pain   3. Lumbosacral radiculitis      Plan:  Patient was evaluated and treated and all questions answered.   -Reviewed EMG/NCV and MRI with her and that she likely does have tarsal tunnel syndrome to a degree and this could be treated medically or surgically. She would prefer to avoid surgery and most of that pain is improving. I think she may be causing neuralgia depending how she sits and I encouraged her to try different positions. May also try topical medications PRN.  Return in about 6 months (around 08/21/2020).

## 2020-04-16 ENCOUNTER — Telehealth: Payer: Self-pay | Admitting: Family Medicine

## 2020-04-16 NOTE — Telephone Encounter (Signed)
[facility] [phone] told her to call and ask that Amy,NP request a new script be sent to them by Amy,NP. Pt is also stating her insurance company is asking that all of her doctor's send a fax to them of all of her medications. Please fax Monia Pouch (this is pt's new insurance info from [place]) please fax   To [phone]  DU#[id number] RX SEG#[id number] RX PCN#[id number]  RX GRP#[id number]

## 2020-04-17 MED ORDER — TOPIRAMATE 25 MG PO TABS: 25.0000 mg | ORAL_TABLET | Freq: Two times a day (BID) | ORAL | 0 refills | Status: DC

## 2020-04-17 NOTE — Addendum Note (Signed)
Addended by: Guy Begin on: 04/17/2020 02:08 PM   Modules accepted: Orders

## 2020-04-17 NOTE — Telephone Encounter (Signed)
I called pt and she states she has changed her mail order to [place] (no longer [place]).  [employer] sent her 2 bottle of 100mg  tablets and she need topamax 25mg  tabsl to her local pharmacy [facility] [place].  She has 3 days left of topamax 25mg  tabs and vimpat 2.5 months left of last fill.  [facility] is her new mail order pharmacy.  Sent in 3 month supply to local pharmacy.  She has appt in 06-15-20 and can fill med to order then. (shoud have enough to get to appt.)

## 2020-04-27 ENCOUNTER — Other Ambulatory Visit: Payer: Self-pay | Admitting: Internal Medicine

## 2020-04-27 DIAGNOSIS — R5381 Other malaise: Secondary | ICD-10-CM

## 2020-05-02 ENCOUNTER — Other Ambulatory Visit: Payer: Self-pay | Admitting: Internal Medicine

## 2020-05-02 DIAGNOSIS — Z13820 Encounter for screening for osteoporosis: Secondary | ICD-10-CM

## 2020-05-17 ENCOUNTER — Telehealth: Payer: Self-pay | Admitting: Podiatry

## 2020-05-17 DIAGNOSIS — M5432 Sciatica, left side: Secondary | ICD-10-CM

## 2020-05-17 DIAGNOSIS — M5417 Radiculopathy, lumbosacral region: Secondary | ICD-10-CM

## 2020-05-17 DIAGNOSIS — G5752 Tarsal tunnel syndrome, left lower limb: Secondary | ICD-10-CM

## 2020-05-17 MED ORDER — LIDOCAINE-PRILOCAINE 2.5-2.5 % EX CREA: 1.0000 "application " | TOPICAL_CREAM | CUTANEOUS | 2 refills | Status: DC | PRN

## 2020-05-17 NOTE — Telephone Encounter (Signed)
Refill sent to pharmacy.   

## 2020-05-17 NOTE — Addendum Note (Signed)
Addended bySherryle Lis, Jayveon Convey R on: 05/17/2020 01:55 PM   Modules accepted: Orders

## 2020-05-17 NOTE — Telephone Encounter (Signed)
Patient has requested refill on Liodcaine cream, Please Advise

## 2020-05-28 ENCOUNTER — Ambulatory Visit (INDEPENDENT_AMBULATORY_CARE_PROVIDER_SITE_OTHER): Payer: Medicare HMO | Admitting: Podiatry

## 2020-05-28 ENCOUNTER — Ambulatory Visit (INDEPENDENT_AMBULATORY_CARE_PROVIDER_SITE_OTHER): Payer: Medicare HMO

## 2020-05-28 ENCOUNTER — Other Ambulatory Visit: Payer: Self-pay

## 2020-05-28 ENCOUNTER — Telehealth: Payer: Self-pay | Admitting: *Deleted

## 2020-05-28 ENCOUNTER — Other Ambulatory Visit: Payer: Self-pay | Admitting: Diagnostic Neuroimaging

## 2020-05-28 ENCOUNTER — Telehealth: Payer: Self-pay | Admitting: Diagnostic Neuroimaging

## 2020-05-28 DIAGNOSIS — M79671 Pain in right foot: Secondary | ICD-10-CM | POA: Diagnosis not present

## 2020-05-28 DIAGNOSIS — R609 Edema, unspecified: Secondary | ICD-10-CM

## 2020-05-28 DIAGNOSIS — M5432 Sciatica, left side: Secondary | ICD-10-CM

## 2020-05-28 DIAGNOSIS — M5417 Radiculopathy, lumbosacral region: Secondary | ICD-10-CM | POA: Diagnosis not present

## 2020-05-28 DIAGNOSIS — G5753 Tarsal tunnel syndrome, bilateral lower limbs: Secondary | ICD-10-CM

## 2020-05-28 DIAGNOSIS — M79672 Pain in left foot: Secondary | ICD-10-CM

## 2020-05-28 DIAGNOSIS — G5752 Tarsal tunnel syndrome, left lower limb: Secondary | ICD-10-CM

## 2020-05-28 DIAGNOSIS — G40219 Localization-related (focal) (partial) symptomatic epilepsy and epileptic syndromes with complex partial seizures, intractable, without status epilepticus: Secondary | ICD-10-CM

## 2020-05-28 MED ORDER — LIDOCAINE-PRILOCAINE 2.5-2.5 % EX CREA: 1.0000 "application " | TOPICAL_CREAM | CUTANEOUS | 2 refills | Status: DC | PRN

## 2020-05-28 MED ORDER — TOPIRAMATE 25 MG PO TABS: 25.0000 mg | ORAL_TABLET | Freq: Two times a day (BID) | ORAL | 0 refills | Status: DC

## 2020-05-28 MED ORDER — TOPIRAMATE 100 MG PO TABS: 100.0000 mg | ORAL_TABLET | Freq: Two times a day (BID) | ORAL | 3 refills | Status: DC

## 2020-05-28 MED ORDER — LACOSAMIDE 200 MG PO TABS: 200.0000 mg | ORAL_TABLET | Freq: Two times a day (BID) | ORAL | 1 refills | Status: DC

## 2020-05-28 NOTE — Telephone Encounter (Signed)
Patient is calling because the pharmacy will not dispense and insurance will not cover prescribed medication until a form from insurance([phone]) has been completed.please advise.

## 2020-05-28 NOTE — Addendum Note (Signed)
Addended by: Minna Antis on: 05/28/2020 02:46 PM   Modules accepted: Orders

## 2020-05-28 NOTE — Telephone Encounter (Signed)
Teresa Nguyen is aware and is working on the [place]

## 2020-05-28 NOTE — Telephone Encounter (Signed)
PT said her insurance company told her our office didn't finish or correctly fill out her PA for her medication to be filled through mail order. The phone number she gave was [phone]

## 2020-05-28 NOTE — Telephone Encounter (Signed)
This is a correction from my last note, the correct phone number is [phone].

## 2020-05-28 NOTE — Telephone Encounter (Addendum)
[employer] mail service # [phone]. Called patient to clarify what she needs. She needs 2 topamax Rx and vimpat Rx sent to new mail pharmacy,  [place]. I advised we can make that change for her. Patient verbalized understanding, appreciation.

## 2020-05-29 ENCOUNTER — Encounter: Payer: Self-pay | Admitting: Podiatry

## 2020-05-29 ENCOUNTER — Other Ambulatory Visit: Payer: Self-pay | Admitting: Podiatry

## 2020-05-29 DIAGNOSIS — G5753 Tarsal tunnel syndrome, bilateral lower limbs: Secondary | ICD-10-CM

## 2020-05-29 NOTE — Progress Notes (Signed)
  Subjective:  Patient ID: Teresa Nguyen, female    DOB: 1950/04/20,  MRN: [id number]  Chief Complaint  Patient presents with  . Foot Pain    Bilateral foot pain. PT stated that the pain started about 2 weeks ago and the pain was on the bottom of her foot and in the heels she stated that she had a tingling sensation in her heels. She stated that she soaked her feet for about 45 min and that helped some with the pain.    70 y.o. female returns with the above complaint. History confirmed with patient.  Has been doing well until about 2 weeks ago.  The foot started swelling and hurting.  Felt better after soaking.  Overall has been improving.  Physical fields are full and she is walking on water  Objective:  Physical Exam: warm, good capillary refill, no trophic changes or ulcerative lesions and normal DP and PT pulses.  Negative Tinel's sign.  Minor edema plantar forefoot   X-rays of both feet were taken.  No evidence of fracture or significant degenerative changes since last visit Assessment:   1. Pain in both feet   2. Tarsal tunnel syndrome, left   3. Lumbosacral radiculitis   4. Sciatica of left side      Plan:  Patient was evaluated and treated and all questions answered.   -Overall improving since few weeks ago -Encouraged her to use the lidocaine prilocaine cream, new prescription was sent.  We are working on prior authorization as she has new insurance -Encouraged her to soak her feet in Epsom salt twice daily. -I think some of this could be from peripheral edema 2.  She said after it hurts she walked less than she usually does.  Encouraged her to walk as much as possible and continue daily exercise.  Return if symptoms worsen or fail to improve.

## 2020-05-30 ENCOUNTER — Telehealth: Payer: Self-pay | Admitting: Family Medicine

## 2020-05-30 NOTE — Telephone Encounter (Signed)
[facility] Olin Hauser) called in regard to lacosamide (VIMPAT) 200 MG TABS tablet the DEA for Dr. Jaynee Eagles did not come through. Would like to confirm Dr. Cathren Laine DEA. Contact info: [phone] Reference no: [id number]

## 2020-05-30 NOTE — Telephone Encounter (Signed)
I called [facility] and spoke with Elberta Fortis. I provided Dr Cathren Laine DEA number. He verbalized understanding and appreciation for the call back.

## 2020-06-26 ENCOUNTER — Ambulatory Visit (INDEPENDENT_AMBULATORY_CARE_PROVIDER_SITE_OTHER): Payer: Medicare HMO | Admitting: Family Medicine

## 2020-06-26 ENCOUNTER — Other Ambulatory Visit: Payer: Self-pay

## 2020-06-26 ENCOUNTER — Encounter: Payer: Self-pay | Admitting: Family Medicine

## 2020-06-26 VITALS — BP 148/79 | HR 58 | Ht 64.0 in | Wt 162.0 lb

## 2020-06-26 DIAGNOSIS — G40219 Localization-related (focal) (partial) symptomatic epilepsy and epileptic syndromes with complex partial seizures, intractable, without status epilepticus: Secondary | ICD-10-CM

## 2020-06-26 MED ORDER — TOPIRAMATE 100 MG PO TABS: 100.0000 mg | ORAL_TABLET | Freq: Two times a day (BID) | ORAL | 3 refills | Status: DC

## 2020-06-26 MED ORDER — TOPIRAMATE 25 MG PO TABS: 25.0000 mg | ORAL_TABLET | Freq: Two times a day (BID) | ORAL | 3 refills | Status: DC

## 2020-06-26 NOTE — Progress Notes (Signed)
PATIENT: Teresa Nguyen DOB: 1951/01/10  REASON FOR VISIT: follow up HISTORY FROM: patient  Chief Complaint  Patient presents with  . Follow-up    Rm 1 alone Pt is well, no seizures, has some pain in feet      HISTORY OF PRESENT ILLNESS: 06/26/20 ALL: She returns today for follow up for seizures and RLS. She denies any seizure activity since last being seen. She continues topiramate 125mg  BID and lacosamide 200mg  twice daily and tolerating well. She has weaned ropinirole (prescribed by PCP) to 0.75 (takes 3/4 tablet) daily that seems to be helped with foot pain. She is having more numbness in right heel, now also in left heel. She feels that it is worse when she does not wear socks. She is seeing foot specialist for tarsal tunnel syndrome. She is soaking her feet in warm water that helps. Massage also helps. Lidocaine cream has not been very effective. She is walking regularly.   06/27/2019 ALL:  Teresa Nguyen is a 70 y.o. female here today for follow up for seizures and RLS. She continues topiramate 125mg  twice daily and lacosamide 200mg  twice daily. She also continues ropinirole 1mg  at bedtime. She is doing well. She denies seizures. RLS is well managed. She is recovering from a fall in 01/2019 where she broke her ankle and coccyx. She fell after rolling her ankle while trying to cut on a light in her room. She is living alone. She does not drive due to not having a car. She is able to perform ADL's independently.   HISTORY: (copied from my note on 12/28/2018)  Teresa Nguyen is a 70 y.o. female here today for follow up for seizure disorder and RLS. She continues topiramate 125mg  twice daily and lacosamide 200mg  twice daily. She was seen by [facility] for evaluation of non epileptic events. No changes were made to regimen as she had no further events after increasing topiramate back to 125mg  twice daily. She reports that she had one event about 3 months ago. She was lying in beg and felt it  coming on. She went to take a Valium but event was over before she could get to her medications. She denies tonic clonic movements. She denies incontinence or tongue injury. Event was very brief and she had been under significant stress that day. She continues ropinirole 1mg  at bedtime and feels it helps. She is seeing psychiatry regularly. She was last seen about a month ago. She does not drive.   HISTORY: (copied from Dr Gladstone Lighter note on 12/29/2017)  UPDATE (12/29/17, VRP): Since last visit, doingwell until 12/21/17 and 12/27/17 --> chest muscle spasm, difficulty talking, but no LOC. Each attack lasted 10-20 seconds, 30-50 attacks. Went to ER on 12/21/17. Then again to ER in 12/27/17. Had been taking CBZ for 3 months prior, but felt like it was triggering seizures, so she stopped in early Sept 2019. Mood is stable.  UPDATE (10/11/17, VRP): Since last visit, doing well until 1-2 weeks ago, woke up in night with chest muscle spasm; lasted 30 minutes. Then again over next 2 nights, 30-45 minutes. Also last night with similar events. No LOC or altered consciousness. No aphasia. Tolerating meds. Having more GI issues (diarrhea) in last few months.   UPDATE (03/17/17, VRP): Since last visit, doing well. Tolerating meds. No alleviating or aggravating factors. No seizures. Has seen Dr. Noemi Chapel (psychiatry) and was started on Equetro (carbamazepine ER samples).   UPDATE 08/25/16: Since last visit, no seizures.  Had some "aura" sensations which consistent of "pulse" in the head sensation, esp with stress. Tolerating meds.   UPDATE 12/31/15: Last night forgot vimpat dose; this morning at 4am, woke up, had to urinate, stood up and then fell back on to bed, may have had brief LOC. Thinks she had a seizure. No post-ictal confusion. No tongue biting. Restless legs stable with medication.   UPDATE 12/31/14: Since last visit, doing well, no seizures. Used valium 10mg  x 1 time in the last year for a seizure aura.  Tolerating vimpat 200mg  BID + TPX 100mg  BID. Went to ER in Feb for syncope (dx with dehydration).   UPDATE 12/29/13 (LL): Since last visit, went to [facility] to have EMU monitoring. EMU report -->June 15-18, 2015: "Long-term video EEG monitoring captured multiple events; including electrographic frontal hypermotor seizures. She also exhibited episodes of oral dyskinetic activity which is not seizure (no EEG correlate)." It was concluded that her typical arousal events are consistent with frontal lobe epilepsy. Home medications Vimpat 200 mg twice a day,Topirimate 100 mg twice a day were resumed, but they recommended reducing home dose of Valium as she was taking 10-20 mg when necessary which likely to affects her affect and impairs her communication. She has had several close family members who have died in the last year, including her mother and her sister who died suddenly. Patient is very content on her current medication regimen, stating that she has felt better on Vimpat than ever before. She has not had many nocturnal seizures since starting Vimpat, and states that she was afraid to go to sleep prior to starting it. Her past AEDs have been phenobarbital, phenytoin, Lacosamide, lamotrigine, levetiracetam, Topirimate, valproic acid and zonisamide. Known seizure triggers are eating broccoli or eggplant. RLS symptoms are controlled on Ropinirole, although some nights she must take an extra 0.25 of a tablet to be comfortable.  UPDATE 12/28/12: Outside neurologist records received and reviewed. 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 Since last visit, no more seizures or spells. Her RLS is getting worse, b/c she is running out of ropinirole and was reducing the dose. Still with high stress. Not interested in seeing psychiatry.   UPDATE 06/10/12: Since last visit, sz stopped, until Dec 2013. She was preparing for colonoscopy, and anticipated having diarrhea from bowel prep.  Before she took the bowel prep meds, she started with diarrhea. Soon after she had a seizure (no LOC, eye up, head turned, mouth twitching; no generalized convulsions. she could hear and move, but not talk). She took a valium, and her spell stopped. I still haven't received records from prior neurologist. Patient tells me that she was dx'd with "partial non-epileptic seizures". She was told to take valium prn to stop her seizures at home, in order to avoid freq ER trips.   PRIOR HPI (12/04/11): 70 year old right-handed female here for evaluation of seizure disorder. At age 74 years old, patient fell down a flight of stairs. Soon after she began to have "petit mall seizures". Patient states she was having inability to speak, convulsions and tongue biting. She started on Dilantin and phenobarbital early in life. Over the years she tried "many different seizure medications". She was living in [place] for most of her life and followed by neurology there. She had extensive testing with EEG, video EEG, sleep study, MRI, without any significant abnormalities. Unclear if any of these seizures were captured during EEG recordings. It sounds like no epileptiform discharges were ever noted.  Unfortunately I do not have any of her prior records to review myself for this visit. Patient has been on Vimpat and topiramate over the past year, and now has one episode every 3 weeks of "seizure". Current episodes consist of gagging sensation, inability to speak, without loss of consciousness tongue biting or convulsions.    REVIEW OF SYSTEMS: Out of a complete 14 system review of symptoms, the patient complains only of the following symptoms, numbness of bilateral heals, left foot pain, and all other reviewed systems are negative.  ALLERGIES: Allergies  Allergen Reactions  . Peanut Oil Diarrhea    Nuts in general   . Other Diarrhea    Nuts and dark green vegetables cause diarrhea and dehydration  . Sulfa Antibiotics Itching  and Rash    Red all over    HOME MEDICATIONS: Outpatient Medications Prior to Visit  Medication Sig Dispense Refill  . acetaminophen (TYLENOL) 500 MG tablet Take 500 mg by mouth 2 (two) times daily as needed for moderate pain or headache.    Marland Kitchen amLODipine (NORVASC) 5 MG tablet Take 1 tablet (5 mg total) by mouth daily. 30 tablet 0  . atorvastatin (LIPITOR) 10 MG tablet Take 10 mg by mouth at bedtime.     . Calcium Carbonate-Vitamin D (CALCIUM-D PO) Take 2 tablets by mouth daily.     . Carboxymethylcellulose Sodium (REFRESH LIQUIGEL OP) Place 1 drop into both eyes daily as needed (dry eyes).    . Desvenlafaxine Succinate ER 25 MG TB24 Take 25 mg by mouth daily.   1  . diazepam (VALIUM) 10 MG tablet Take 10 mg by mouth daily as needed (seizure).    . diclofenac sodium (VOLTAREN) 1 % GEL Apply 1 application topically at bedtime.    . fluticasone (FLONASE) 50 MCG/ACT nasal spray Place 2 sprays into both nostrils at bedtime as needed for allergies.     Marland Kitchen lacosamide (VIMPAT) 200 MG TABS tablet Take 1 tablet (200 mg total) by mouth 2 (two) times daily. 180 tablet 1  . levothyroxine (SYNTHROID, LEVOTHROID) 25 MCG tablet Take 25 mcg by mouth daily before breakfast.     . lidocaine-prilocaine (EMLA) cream Apply 1 application topically as needed. 60 g 2  . loperamide (IMODIUM A-D) 2 MG tablet Take 1 mg by mouth 4 (four) times daily as needed for diarrhea or loose stools.    . meloxicam (MOBIC) 7.5 MG tablet     . methocarbamol (ROBAXIN) 750 MG tablet Take 1 tablet (750 mg total) by mouth 2 (two) times daily as needed for muscle spasms. (Patient taking differently: Take 750 mg by mouth at bedtime.) 20 tablet 0  . Multiple Vitamins-Minerals (MULTIVITAMIN ADULT) TABS Take 1 tablet by mouth daily.    . polyethylene glycol (MIRALAX / GLYCOLAX) packet Take 4.25 g by mouth daily.     Marland Kitchen rOPINIRole (REQUIP) 1 MG tablet Take 1 tablet (1 mg total) by mouth daily. 30 tablet 0  . sodium chloride (OCEAN) 0.65 % SOLN  nasal spray Place 1 spray into both nostrils as needed for congestion.    . vitamin E 400 UNIT capsule Take 400 Units by mouth at bedtime.    . topiramate (TOPAMAX) 100 MG tablet Take 1 tablet (100 mg total) by mouth 2 (two) times daily. 180 tablet 3  . topiramate (TOPAMAX) 25 MG tablet Take 1 tablet (25 mg total) by mouth 2 (two) times daily. 180 tablet 0  . neomycin-polymyxin-hydrocortisone (CORTISPORIN) OTIC solution Apply 1 to 2 drops to  toe BID after soaking 10 mL 0   No facility-administered medications prior to visit.    PAST MEDICAL HISTORY: Past Medical History:  Diagnosis Date  . Anxiety   . Arthritis   . Arthritis of neck   . DVT (deep venous thrombosis) ([place]) 2008   L leg  . Finger fracture, right    small  . Major depressive disorder    pt denies  . Migraine   . Seizure disorder [facility])    since age 74-3, last sz 12/31/15  . Thyroid disease   . Wears partial dentures     PAST SURGICAL HISTORY: Past Surgical History:  Procedure Laterality Date  . BIOPSY  10/17/2018   Procedure: BIOPSY;  Surgeon: Wilford Corner, MD;  Location: [facility];  Service: Endoscopy;;  . BREAST LUMPECTOMY Bilateral 1990  . COLONOSCOPY WITH PROPOFOL N/A 10/17/2018   Procedure: COLONOSCOPY WITH PROPOFOL;  Surgeon: Wilford Corner, MD;  Location: [facility];  Service: Endoscopy;  Laterality: N/A;  . ESOPHAGOGASTRODUODENOSCOPY (EGD) WITH PROPOFOL N/A 10/17/2018   Procedure: ESOPHAGOGASTRODUODENOSCOPY (EGD) WITH PROPOFOL;  Surgeon: Wilford Corner, MD;  Location: [facility];  Service: Endoscopy;  Laterality: N/A;  . NASAL SINUS SURGERY    . POLYPECTOMY  10/17/2018   Procedure: POLYPECTOMY;  Surgeon: Wilford Corner, MD;  Location: [facility];  Service: Endoscopy;;  . TONSILLECTOMY    . TUBAL LIGATION      FAMILY HISTORY: Family History  Problem Relation Age of Onset  . Stroke Father   . Heart attack Father   . Throat cancer Sister   . Seizures Sister   . Migraines Sister   .  Diabetes Maternal Grandmother   . Diabetes Maternal Grandfather   . Kidney Stones Sister     SOCIAL HISTORY: Social History   Socioeconomic History  . Marital status: Divorced    Spouse name: Not on file  . Number of children: 2  . Years of education: 12th  . Highest education level: Not on file  Occupational History  . Occupation: disabled  Tobacco Use  . Smoking status: Former Smoker    Quit date: 04/16/1998    Years since quitting: 22.2  . Smokeless tobacco: Never Used  Vaping Use  . Vaping Use: Never used  Substance and Sexual Activity  . Alcohol use: Yes    Comment: occ. social  . Drug use: Not Currently  . Sexual activity: Not Currently    Birth control/protection: Post-menopausal  Other Topics Concern  . Not on file  Social History Narrative   Patient lives at home alone.   Caffeine Use: 1 cup of coffee daily   Social Determinants of Health   Financial Resource Strain: Not on file  Food Insecurity: Not on file  Transportation Needs: Not on file  Physical Activity: Not on file  Stress: Not on file  Social Connections: Not on file  Intimate Partner Violence: Not on file      PHYSICAL EXAM  Vitals:   06/26/20 1351  BP: (!) 148/79  Pulse: (!) 58  Weight: 162 lb (73.5 kg)  Height: 5\' 4"  (1.626 m)   Body mass index is 27.81 kg/m.  Generalized: Well developed, in no acute distress  Cardiology: normal rate and rhythm, no murmur noted Neurological examination  Mentation: Alert oriented to time, place, history taking. Follows all commands speech and language fluent Cranial nerve II-XII: Pupils were equal round reactive to light. Extraocular movements were full, visual field were full on confrontational test. Facial sensation and strength were  normal. Uvula tongue midline. Head turning and shoulder shrug  were normal and symmetric. Motor: The motor testing reveals 5 over 5 strength of all 4 extremities. Good symmetric motor tone is noted throughout.   Sensory: Sensory testing is intact to soft touch on all 4 extremities. No evidence of extinction is noted.  Coordination: Cerebellar testing reveals good finger-nose-finger and heel-to-shin bilaterally.  Gait and station: Gait is normal. Tandem gait is unsteady.  Reflexes: Deep tendon reflexes are symmetric and normal bilaterally.   DIAGNOSTIC DATA (LABS, IMAGING, TESTING) - I reviewed patient records, labs, notes, testing and imaging myself where available.  No flowsheet data found.   Lab Results  Component Value Date   WBC 6.1 07/21/2019   HGB 12.6 07/21/2019   HCT 39.5 07/21/2019   MCV 95.9 07/21/2019   PLT 188 07/21/2019      Component Value Date/Time   NA 144 07/21/2019 0932   K 4.0 07/21/2019 0932   CL 111 07/21/2019 0932   CO2 26 07/21/2019 0932   GLUCOSE 109 (H) 07/21/2019 0932   BUN 20 07/21/2019 0932   CREATININE 0.74 07/21/2019 0932   CALCIUM 8.8 (L) 07/21/2019 0932   PROT 6.8 02/06/2019 1341   ALBUMIN 4.3 02/06/2019 1341   AST 19 02/06/2019 1341   ALT 14 02/06/2019 1341   ALKPHOS 65 02/06/2019 1341   BILITOT 0.2 (L) 02/06/2019 1341   GFRNONAA >60 07/21/2019 0932   GFRAA >60 07/21/2019 0932   No results found for: CHOL, HDL, LDLCALC, LDLDIRECT, TRIG, CHOLHDL No results found for: HGBA1C No results found for: VITAMINB12 No results found for: TSH     ASSESSMENT AND PLAN 70 y.o. year old female  has a past medical history of Anxiety, Arthritis, Arthritis of neck, DVT (deep venous thrombosis) ([place]) (2008), Finger fracture, right, Major depressive disorder, Migraine, Seizure disorder ([place]), Thyroid disease, and Wears partial dentures. here with     ICD-10-CM   1. Partial symptomatic epilepsy with complex partial seizures, intractable, without status epilepticus (HCC)  G40.219 topiramate (TOPAMAX) 25 MG tablet    topiramate (TOPAMAX) 100 MG tablet     She is doing well on topiramate and lacosamide. We will continue current therapy. PCP is prescribing  ropinirole and she reports RLS is well managed on 0.75 nightly dose. She was encouraged to stay well hydrated and work on healthy lifestyle habits. She will follow up with me in 1 year, sooner if needed. She verbalizes understanding and agreement with this plan.    No orders of the defined types were placed in this encounter.    Meds ordered this encounter  Medications  . topiramate (TOPAMAX) 25 MG tablet    Sig: Take 1 tablet (25 mg total) by mouth 2 (two) times daily.    Dispense:  180 tablet    Refill:  3    Order Specific Question:   Supervising Provider    Answer:   Melvenia Beam [id number]  . topiramate (TOPAMAX) 100 MG tablet    Sig: Take 1 tablet (100 mg total) by mouth 2 (two) times daily.    Dispense:  180 tablet    Refill:  3    Order Specific Question:   Supervising Provider    Answer:   Melvenia Beam [id number]     I spent 15 minutes with the patient. 50% of this time was spent counseling and educating patient on plan of care and medications.     Debbora Presto, FNP-C 06/26/2020, 2:23 PM [facility]  [address], [place] [place], [place] [address] [phone]

## 2020-06-26 NOTE — Patient Instructions (Signed)
Below is our plan:  We will continue topiramate 125mg  and lacosamide 200mg  twice daily. Continue close follow up with your PCP and specialists.   Please make sure you are staying well hydrated. I recommend 50-60 ounces daily. Well balanced diet and regular exercise encouraged. Consistent sleep schedule with 6-8 hours recommended.   Please continue follow up with care team as directed.   Follow up with me in 1 year   You may receive a survey regarding today's visit. I encourage you to leave honest feed back as I do use this information to improve patient care. Thank you for seeing me today!     Seizure, Adult A seizure is a sudden burst of abnormal electrical and chemical activity in the brain. Seizures usually last from 30 seconds to 2 minutes.  What are the causes? Common causes of this condition include:  Fever or infection.  Problems that affect the brain. These may include: ? A brain or head injury. ? Bleeding in the brain. ? A brain tumor.  Low levels of blood sugar or salt.  Kidney problems or liver problems.  Conditions that are passed from parent to child (are inherited).  Problems with a substance, such as: ? Having a reaction to a drug or a medicine. ? Stopping the use of a substance all of a sudden (withdrawal).  A stroke.  Disorders that affect how you develop. Sometimes, the cause may not be known.  What increases the risk?  Having someone in your family who has epilepsy. In this condition, seizures happen again and again over time. They have no clear cause.  Having had a tonic-clonic seizure before. This type of seizure causes you to: ? Tighten the muscles of the whole body. ? Lose consciousness.  Having had a head injury or strokes before.  Having had a lack of oxygen at birth. What are the signs or symptoms? There are many types of seizures. The symptoms vary depending on the type of seizure you have. Symptoms during a seizure  Shaking that you  cannot control (convulsions) with fast, jerky movements of muscles.  Stiffness of the body.  Breathing problems.  Feeling mixed up (confused).  Staring or not responding to sound or touch.  Head nodding.  Eyes that blink, flutter, or move fast.  Drooling, grunting, or making clicking sounds with your mouth  Losing control of when you pee or poop. Symptoms before a seizure  Feeling afraid, nervous, or worried.  Feeling like you may vomit.  Feeling like: ? You are moving when you are not. ? Things around you are moving when they are not.  Feeling like you saw or heard something before (dj vu).  Odd tastes or smells.  Changes in how you see. You may see flashing lights or spots. Symptoms after a seizure  Feeling confused.  Feeling sleepy.  Headache.  Sore muscles. How is this treated? If your seizure stops on its own, you will not need treatment. If your seizure lasts longer than 5 minutes, you will normally need treatment. Treatment may include:  Medicines given through an IV tube.  Avoiding things, such as medicines, that are known to cause your seizures.  Medicines to prevent seizures.  A device to prevent or control seizures.  Surgery.  A diet low in carbohydrates and high in fat (ketogenic diet). Follow these instructions at home: Medicines  Take over-the-counter and prescription medicines only as told by your doctor.  Avoid foods or drinks that may keep your medicine  from working, such as alcohol. Activity  Follow instructions about driving, swimming, or doing things that would be dangerous if you had another seizure. Wait until your doctor says it is safe for you to do these things.  If you live in the [place] ask your local department of motor vehicles when you can drive.  Get a lot of rest. Teaching others  Teach friends and family what to do when you have a seizure. They should: ? Help you get down to the ground. ? Protect your head and  body. ? Loosen any clothing around your neck. ? Turn you on your side. ? Know whether or not you need emergency care. ? Stay with you until you are better.  Also, tell them what not to do if you have a seizure. Tell them: ? They should not hold you down. ? They should not put anything in your mouth.   General instructions  Avoid anything that gives you seizures.  Keep a seizure diary. Write down: ? What you remember about each seizure. ? What you think caused each seizure.  Keep all follow-up visits. Contact a doctor if:  You have another seizure or seizures. Call the doctor each time you have a seizure.  The pattern of your seizures changes.  You keep having seizures with treatment.  You have symptoms of being sick or having an infection.  You are not able to take your medicine. Get help right away if:  You have any of these problems: ? A seizure that lasts longer than 5 minutes. ? Many seizures in a row and you do not feel better between seizures. ? A seizure that makes it harder to breathe. ? A seizure and you can no longer speak or use part of your body.  You do not wake up right after a seizure.  You get hurt during a seizure.  You feel confused or have pain right after a seizure. These symptoms may be an emergency. Get help right away. Call your local emergency services (911 in the [place]  Do not wait to see if the symptoms will go away.  Do not drive yourself to the hospital. Summary  A seizure is a sudden burst of abnormal electrical and chemical activity in the brain. Seizures normally last from 30 seconds to 2 minutes.  Causes of seizures include illness, injury to the head, low levels of blood sugar or salt, and certain conditions.  Most seizures will stop on their own in less than 5 minutes. Seizures that last longer than 5 minutes are a medical emergency and need treatment right away.  Many medicines are used to treat seizures. Take over-the-counter  and prescription medicines only as told by your doctor. This information is not intended to replace advice given to you by your health care provider. Make sure you discuss any questions you have with your health care provider. Document Revised: 10/06/2019 Document Reviewed: 10/06/2019 Elsevier Patient Education  [place].

## 2020-07-01 NOTE — Progress Notes (Signed)
I reviewed note and agree with plan.   Penni Bombard, MD 11/13/2120, 4:82 PM Certified in Neurology, Neurophysiology and Neuroimaging  [facility] [address], [place], [place] [address] [phone]

## 2020-07-29 ENCOUNTER — Other Ambulatory Visit: Payer: Self-pay | Admitting: *Deleted

## 2020-07-29 DIAGNOSIS — G40219 Localization-related (focal) (partial) symptomatic epilepsy and epileptic syndromes with complex partial seizures, intractable, without status epilepticus: Secondary | ICD-10-CM

## 2020-07-29 MED ORDER — TOPIRAMATE 100 MG PO TABS: 100.0000 mg | ORAL_TABLET | Freq: Two times a day (BID) | ORAL | 3 refills | Status: DC

## 2020-07-29 MED ORDER — TOPIRAMATE 25 MG PO TABS: 25.0000 mg | ORAL_TABLET | Freq: Two times a day (BID) | ORAL | 3 refills | Status: DC

## 2020-08-06 ENCOUNTER — Other Ambulatory Visit: Payer: Self-pay

## 2020-08-06 ENCOUNTER — Encounter: Payer: Self-pay | Admitting: Podiatry

## 2020-08-06 ENCOUNTER — Ambulatory Visit (INDEPENDENT_AMBULATORY_CARE_PROVIDER_SITE_OTHER): Payer: Medicare HMO | Admitting: Podiatry

## 2020-08-06 DIAGNOSIS — M2011 Hallux valgus (acquired), right foot: Secondary | ICD-10-CM | POA: Diagnosis not present

## 2020-08-06 DIAGNOSIS — B351 Tinea unguium: Secondary | ICD-10-CM

## 2020-08-06 DIAGNOSIS — L84 Corns and callosities: Secondary | ICD-10-CM | POA: Diagnosis not present

## 2020-08-06 MED ORDER — CICLOPIROX 8 % EX SOLN: Freq: Every day | CUTANEOUS | 3 refills | Status: DC

## 2020-08-06 NOTE — Progress Notes (Signed)
  Subjective:  Patient ID: Teresa Nguyen, female    DOB: 11-08-1950,  MRN: [id number]  Chief Complaint  Patient presents with  . Foot Pain    Patient states that her right great-toe is rubbing against her 2nd toe cause a callous to form on the medial side which is becoming painful. Patient also states that her left foot is feeling much better and she has been soaking in epsom salt and walking as much as possible as directed.     70 y.o. female presents with the above complaint. History confirmed with patient.  Previous pains have improved significantly.  She has loosening of the portion of the left hallux nail with discoloration  Objective:  Physical Exam: warm, good capillary refill, no trophic changes or ulcerative lesions, normal DP and PT pulses and normal sensory exam. Left Foot: Discolored onycholysis distal lateral hallux nail Right Foot: She has hallux interphalangeus deformity with callus on the medial hallux and lateral second toe  Assessment:   1. Acquired hallux interphalangeus, right   2. Callus of foot   3. Onychomycosis      Plan:  Patient was evaluated and treated and all questions answered.  All symptomatic hyperkeratoses were safely debrided with a sterile #15 blade to patient's level of comfort without incident. We discussed preventative and palliative care of these lesions including supportive and accommodative shoegear, padding, prefabricated and custom molded accommodative orthoses, use of a pumice stone and lotions/creams daily.  Dispensed silicone toe cap to offload this area on the hallux and second toe on the right foot.  Discussed with her that if it is worsening or does not improve could consider surgical correction of the deformity.  -A believe the discoloration of medial hallux nail is likely onychomycosis.  Prescribed her Penlac for this and she will use daily.  Return as needed No follow-ups on file.

## 2020-08-29 ENCOUNTER — Other Ambulatory Visit: Payer: Self-pay | Admitting: Radiology

## 2020-08-29 ENCOUNTER — Ambulatory Visit: Payer: Medicare HMO | Admitting: Podiatry

## 2020-08-29 ENCOUNTER — Other Ambulatory Visit: Payer: Self-pay | Admitting: Pediatrics

## 2020-08-29 DIAGNOSIS — N63 Unspecified lump in unspecified breast: Secondary | ICD-10-CM

## 2020-09-20 ENCOUNTER — Ambulatory Visit
Admission: RE | Admit: 2020-09-20 | Discharge: 2020-09-20 | Disposition: A | Discharge status: Home / Self Care | Payer: Medicare HMO | Source: Ambulatory Visit | Attending: Radiology | Admitting: Radiology

## 2020-09-20 ENCOUNTER — Other Ambulatory Visit: Payer: Self-pay

## 2020-09-20 DIAGNOSIS — N63 Unspecified lump in unspecified breast: Secondary | ICD-10-CM

## 2020-10-03 ENCOUNTER — Ambulatory Visit
Admission: RE | Admit: 2020-10-03 | Discharge: 2020-10-03 | Disposition: A | Discharge status: Home / Self Care | Payer: Medicare HMO | Source: Ambulatory Visit | Attending: Internal Medicine | Admitting: Internal Medicine

## 2020-10-03 ENCOUNTER — Other Ambulatory Visit: Payer: Medicare HMO

## 2020-10-03 ENCOUNTER — Other Ambulatory Visit: Payer: Self-pay

## 2020-10-03 DIAGNOSIS — Z13820 Encounter for screening for osteoporosis: Secondary | ICD-10-CM

## 2020-11-05 ENCOUNTER — Telehealth: Payer: Self-pay | Admitting: Family Medicine

## 2020-11-05 ENCOUNTER — Other Ambulatory Visit: Payer: Self-pay | Admitting: Neurology

## 2020-11-05 DIAGNOSIS — G40219 Localization-related (focal) (partial) symptomatic epilepsy and epileptic syndromes with complex partial seizures, intractable, without status epilepticus: Secondary | ICD-10-CM

## 2020-11-05 NOTE — Telephone Encounter (Signed)
Pt left a vm asking if Amy, NP can have a couple lacosamide (VIMPAT) 200 MG TABS  called in for her due to her insurance processing slowly. Please call.

## 2020-11-05 NOTE — Telephone Encounter (Signed)
Patient arrived to the lobby stating she was on hold for over 20 minutes this morning and thought she would arrive in person for medication refill request Vimpat 200 mg. Best call back is [phone].

## 2020-11-05 NOTE — Telephone Encounter (Signed)
I called the patient at the phone number that was provided and there was no answer. LVM asking for the patient to clarify where the script should go.  Previously the refill had got to [facility] service but with the VM she left as well it wasn't sure if she is asking that one be sent to local pharmacy.  Asked the pt to call back to clarify where the script will go once I have that information, I will send for the pt.    In another phone note that was sent to me the patient had called and LVM for refill.    "Pt left a vm asking if Amy, NP can have a couple lacosamide (VIMPAT) 200 MG TABS  called in for her due to her insurance processing slowly. Please call."

## 2020-11-06 ENCOUNTER — Other Ambulatory Visit: Payer: Self-pay | Admitting: Neurology

## 2020-11-06 DIAGNOSIS — G40219 Localization-related (focal) (partial) symptomatic epilepsy and epileptic syndromes with complex partial seizures, intractable, without status epilepticus: Secondary | ICD-10-CM

## 2020-11-06 MED ORDER — LACOSAMIDE 200 MG PO TABS: 200.0000 mg | ORAL_TABLET | Freq: Two times a day (BID) | ORAL | 1 refills | Status: DC

## 2020-11-06 NOTE — Telephone Encounter (Signed)
Called the patient again this am and she spoke with her insurance/pharmacy and was advised that she should be getting the medication. Advised I would go ahead and forward a refill for the patient to the mail service pharmacy for them to have on file for when she is due next time. Pt verbalized understanding and was appreciative for the call back.

## 2021-03-31 ENCOUNTER — Telehealth: Payer: Self-pay | Admitting: Family Medicine

## 2021-03-31 NOTE — Telephone Encounter (Signed)
Called the pt back. She states that she has noticed that she has started having difficulty with speech and memory concerns. She works with dementia and memory patient's. Pt has been on the 100 mg BID for several years and the 25 mg BID at least for 2 yrs. She states that she wanted to stop the 25 mg BID. I advised that for her to have been on this medication for as long as she has if she was going to have those side effects it would have happened when she started the medication. Pt had already stopped taking the medication yesterday and I had advised that she shouldn't stop the medication without first being evaluated. Advised that this could put her at increase risk for seizures. Pt verbalized understanding and states she will restart. She is asking to be seen sooner. I was able to plug her in with Dr Leta Baptist for a sooner apt in feb and advised I would place on wait list. Most likely they will want to do memory testing. Advised she should make sure to arrive 30 min prior to apt. Pt verbalized understanding.

## 2021-03-31 NOTE — Telephone Encounter (Signed)
Pt is asking for a call to discuss that she plans to stop taking the :topiramate (TOPAMAX) 25 MG tablet and will only continue to take the topiramate (TOPAMAX) 100 MG tablet

## 2021-04-21 ENCOUNTER — Telehealth: Payer: Self-pay | Admitting: Diagnostic Neuroimaging

## 2021-04-21 NOTE — Telephone Encounter (Signed)
LVM and sent mychart msg informing pt of r/s needed- MD schedule change.

## 2021-05-12 ENCOUNTER — Encounter: Payer: Self-pay | Admitting: Diagnostic Neuroimaging

## 2021-05-12 ENCOUNTER — Ambulatory Visit (INDEPENDENT_AMBULATORY_CARE_PROVIDER_SITE_OTHER): Payer: Medicare Other | Admitting: Diagnostic Neuroimaging

## 2021-05-12 DIAGNOSIS — G40219 Localization-related (focal) (partial) symptomatic epilepsy and epileptic syndromes with complex partial seizures, intractable, without status epilepticus: Secondary | ICD-10-CM | POA: Diagnosis not present

## 2021-05-12 MED ORDER — LACOSAMIDE 200 MG PO TABS: 200.0000 mg | ORAL_TABLET | Freq: Two times a day (BID) | ORAL | 4 refills | Status: DC

## 2021-05-12 MED ORDER — TOPIRAMATE 100 MG PO TABS: 100.0000 mg | ORAL_TABLET | Freq: Two times a day (BID) | ORAL | 4 refills | Status: DC

## 2021-05-12 NOTE — Progress Notes (Signed)
[facility]  PATIENT: Teresa Nguyen DOB: 08/09/1950  REFERRING CLINICIAN:  HISTORY FROM: patient  REASON FOR VISIT: follow up    HISTORICAL  CHIEF COMPLAINT:  Chief Complaint  Patient presents with   Epilepsy    Rm 7  "FU for memory concerns"  MMSE 27    HISTORY OF PRESENT ILLNESS:   UPDATE (05/12/21, VRP): Since last visit, here for evaluation of memory issues. Mild memory lapses, but no changes in ADLs. In fact, she is able to take care of herself and her neighbors. No spells or seizures.   UPDATE (05/03/18, Dr. Rona Ravens, [facility]): 71 year old woman with history of intractable, likely frontal lobe epilepsy. EMU monitoring in 2015 captured three stereotyped events with right temporal slowing and sharp waves. Epileptic seizures, with onset since childhood, consist mostly of oral automatisms with various degrees of evolution. They have been well controlled with LCM, 200 BID, along with TPM 125 BID. She previously failed PB, PHT, LTG, and ZON. Of note, video EEG in 2015 also caught an event with oral dyskinesia that was thought to be nonepileptic. These are more rare.    UPDATE (12/29/17, VRP): Since last visit, doing well until 12/21/17 and 12/27/17 --> chest muscle spasm, difficulty talking, but no LOC. Each attack lasted 10-20 seconds, 30-50 attacks. Went to ER on 12/21/17. Then again to ER in 12/27/17. Had been taking CBZ for 3 months prior, but felt like it was triggering seizures, so she stopped in early Sept 2019. Mood is stable.   UPDATE (10/11/17, VRP): Since last visit, doing well until 1-2 weeks ago, woke up in night with chest muscle spasm; lasted 30 minutes. Then again over next 2 nights, 30-45 minutes. Also last night with similar events. No LOC or altered consciousness.  No aphasia. Tolerating meds. Having more GI issues (diarrhea) in last few months.   UPDATE (03/17/17, VRP): Since last visit, doing well. Tolerating meds. No alleviating or aggravating factors. No  seizures. Has seen Dr. Noemi Chapel (psychiatry) and was started on Equetro (carbamazepine ER samples).   UPDATE 08/25/16: Since last visit, no seizures. Had some "aura" sensations which consistent of "pulse" in the head sensation, esp with stress. Tolerating meds.   UPDATE 12/31/15: Last night forgot vimpat dose; this morning at 4am, woke up, had to urinate, stood up and then fell back on to bed, may have had brief LOC. Thinks she had a seizure. No post-ictal confusion.  No tongue biting. Restless legs stable with medication.   UPDATE 12/31/14: Since last visit, doing well, no seizures. Used valium 10mg  x 1 time in the last year for a seizure aura. Tolerating vimpat 200mg  BID + TPX 100mg  BID. Went to ER in Feb for syncope (dx with dehydration).   UPDATE 12/29/13 (LL): Since last visit, went to [facility] to have EMU monitoring. EMU report --> June 15-18, 2015: "Long-term video EEG monitoring captured multiple events; including electrographic frontal hypermotor seizures. She also exhibited episodes of oral dyskinetic activity which is not seizure (no EEG correlate)." It was concluded that her typical arousal events are consistent with frontal lobe epilepsy. Home medications Vimpat 200 mg twice a day,Topirimate 100 mg twice a day were resumed, but they recommended reducing home dose of Valium as she was taking 10-20 mg when necessary which likely to affects her affect and impairs her communication. She has had several close family members who have died in the last year, including her mother and her sister who died suddenly. Patient is  very content on her current medication regimen, stating that she has felt better on Vimpat than ever before. She has not had many nocturnal seizures since starting Vimpat, and states that she was afraid to go to sleep prior to starting it. Her past AEDs have been phenobarbital, phenytoin, Lacosamide, lamotrigine, levetiracetam, Topirimate, valproic acid and zonisamide. Known seizure  triggers are eating broccoli or eggplant. RLS symptoms are controlled on Ropinirole, although some nights she must take an extra 0.25 of a tablet to be comfortable.  UPDATE 12/28/12: Outside neurologist records received and reviewed. 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 Since last visit, no more seizures or spells. Her RLS is getting worse, b/c she is running out of ropinirole and was reducing the dose. Still with high stress. Not interested in seeing psychiatry.   UPDATE 06/10/12: Since last visit, sz stopped, until Dec 2013. She was preparing for colonoscopy, and anticipated having diarrhea from bowel prep. Before she took the bowel prep meds, she started with diarrhea. Soon after she had a seizure (no LOC, eye up, head turned, mouth twitching; no generalized convulsions. she could hear and move, but not talk). She took a valium, and her spell stopped. I still haven't received records from prior neurologist. Patient tells me that she was dx'd with "partial non-epileptic seizures". She was told to take valium prn to stop her seizures at home, in order to avoid freq ER trips.    PRIOR HPI (12/04/11): 71 year old right-handed female here for evaluation of seizure disorder. At age 76 years old, patient fell down a flight of stairs. Soon after she began to have "petit mall seizures". Patient states she was having inability to speak, convulsions and tongue biting. She started on Dilantin and phenobarbital early in life. Over the years she tried "many different seizure medications". She was living in [place] for most of her life and followed by neurology there. She had extensive testing with EEG, video EEG, sleep study, MRI, without any significant abnormalities. Unclear if any of these seizures were captured during EEG recordings. It sounds like no epileptiform discharges were ever noted. Unfortunately I do not have any of her prior records to review myself for this visit.  Patient has been on Vimpat and topiramate over the past year, and now has one episode every 3 weeks of "seizure". Current episodes consist of gagging sensation, inability to speak, without loss of consciousness tongue biting or convulsions.    REVIEW OF SYSTEMS: Full 14 system review of systems performed and negative except: seizure drooling memory loss.   ALLERGIES: Allergies  Allergen Reactions   Peanut Oil Diarrhea    Nuts in general    Other Diarrhea    Nuts and dark green vegetables cause diarrhea and dehydration   Sulfa Antibiotics Itching and Rash    Red all over    HOME MEDICATIONS: Outpatient Medications Prior to Visit  Medication Sig Dispense Refill   acetaminophen (TYLENOL) 500 MG tablet Take 500 mg by mouth 2 (two) times daily as needed for moderate pain or headache.     amLODipine (NORVASC) 5 MG tablet Take 1 tablet (5 mg total) by mouth daily. 30 tablet 0   atorvastatin (LIPITOR) 10 MG tablet Take 10 mg by mouth at bedtime.      Calcium Carbonate-Vitamin D (CALCIUM-D PO) Take 2 tablets by mouth daily.      Carboxymethylcellulose Sodium (REFRESH LIQUIGEL OP) Place 1 drop into both eyes daily as needed (dry eyes).  ciclopirox (PENLAC) 8 % solution Apply topically at bedtime. Apply over nail and surrounding skin. Apply daily over previous coat. After seven (7) days, may remove with alcohol and continue cycle. 6.6 mL 3   cycloSPORINE (RESTASIS) 0.05 % ophthalmic emulsion 1 drop 2 (two) times daily.     Desvenlafaxine Succinate ER 25 MG TB24 Take 25 mg by mouth daily.   1   diazepam (VALIUM) 10 MG tablet Take 10 mg by mouth daily as needed (seizure).     diclofenac sodium (VOLTAREN) 1 % GEL Apply 1 application topically at bedtime.     fluticasone (FLONASE) 50 MCG/ACT nasal spray Place 2 sprays into both nostrils at bedtime as needed for allergies.      levothyroxine (SYNTHROID, LEVOTHROID) 25 MCG tablet Take 25 mcg by mouth daily before breakfast.       lidocaine-prilocaine (EMLA) cream Apply 1 application topically as needed. 60 g 2   loperamide (IMODIUM A-D) 2 MG tablet Take 1 mg by mouth 4 (four) times daily as needed for diarrhea or loose stools.     meloxicam (MOBIC) 7.5 MG tablet      methocarbamol (ROBAXIN) 750 MG tablet Take 1 tablet (750 mg total) by mouth 2 (two) times daily as needed for muscle spasms. (Patient taking differently: Take 750 mg by mouth at bedtime.) 20 tablet 0   Multiple Vitamins-Minerals (MULTIVITAMIN ADULT) TABS Take 1 tablet by mouth daily.     Omega-3 Fatty Acids (FISH OIL) 1000 MG CAPS Take 1,000 mg by mouth in the morning and at bedtime.     polyethylene glycol (MIRALAX / GLYCOLAX) packet Take 4.25 g by mouth daily.      rOPINIRole (REQUIP) 1 MG tablet Take 1 tablet (1 mg total) by mouth daily. 30 tablet 0   sodium chloride (OCEAN) 0.65 % SOLN nasal spray Place 1 spray into both nostrils as needed for congestion.     vitamin E 400 UNIT capsule Take 400 Units by mouth at bedtime.     lacosamide (VIMPAT) 200 MG TABS tablet Take 1 tablet (200 mg total) by mouth 2 (two) times daily. 180 tablet 1   topiramate (TOPAMAX) 100 MG tablet Take 1 tablet (100 mg total) by mouth 2 (two) times daily. 180 tablet 3   topiramate (TOPAMAX) 25 MG tablet Take 1 tablet (25 mg total) by mouth 2 (two) times daily. 180 tablet 3   No facility-administered medications prior to visit.    PAST MEDICAL HISTORY: Past Medical History:  Diagnosis Date   Anxiety    Arthritis    Arthritis of neck    DVT (deep venous thrombosis) ([place]) 2008   L leg   Finger fracture, right    small   Major depressive disorder    pt denies   Migraine    Seizure disorder [facility])    since age 12-3, last sz 12/31/15   Thyroid disease    Wears partial dentures     PAST SURGICAL HISTORY: Past Surgical History:  Procedure Laterality Date   BIOPSY  10/17/2018   Procedure: BIOPSY;  Surgeon: Wilford Corner, MD;  Location: [facility];  Service: Endoscopy;;    BREAST LUMPECTOMY Bilateral 1990   COLONOSCOPY WITH PROPOFOL N/A 10/17/2018   Procedure: COLONOSCOPY WITH PROPOFOL;  Surgeon: Wilford Corner, MD;  Location: [facility];  Service: Endoscopy;  Laterality: N/A;   ESOPHAGOGASTRODUODENOSCOPY (EGD) WITH PROPOFOL N/A 10/17/2018   Procedure: ESOPHAGOGASTRODUODENOSCOPY (EGD) WITH PROPOFOL;  Surgeon: Wilford Corner, MD;  Location: [facility];  Service: Endoscopy;  Laterality: N/A;   NASAL SINUS SURGERY     POLYPECTOMY  10/17/2018   Procedure: POLYPECTOMY;  Surgeon: Wilford Corner, MD;  Location: [facility];  Service: Endoscopy;;   TONSILLECTOMY     TUBAL LIGATION      FAMILY HISTORY: Family History  Problem Relation Age of Onset   Stroke Father    Heart attack Father    Throat cancer Sister    Seizures Sister    Migraines Sister    Diabetes Maternal Grandmother    Diabetes Maternal Grandfather    Kidney Stones Sister     SOCIAL HISTORY:  Social History   Socioeconomic History   Marital status: Divorced    Spouse name: Not on file   Number of children: 2   Years of education: 12th   Highest education level: Not on file  Occupational History   Occupation: disabled  Tobacco Use   Smoking status: Former    Types: Cigarettes    Quit date: 04/16/1998    Years since quitting: 23.0   Smokeless tobacco: Never  Vaping Use   Vaping Use: Never used  Substance and Sexual Activity   Alcohol use: Yes    Comment: occ. social   Drug use: Not Currently   Sexual activity: Not Currently    Birth control/protection: Post-menopausal  Other Topics Concern   Not on file  Social History Narrative   Patient lives at home alone.   Caffeine Use: 1 cup of coffee daily   Social Determinants of Health   Financial Resource Strain: Not on file  Food Insecurity: Not on file  Transportation Needs: Not on file  Physical Activity: Not on file  Stress: Not on file  Social Connections: Not on file  Intimate Partner Violence: Not on file      PHYSICAL EXAM  GENERAL EXAM/CONSTITUTIONAL: Vitals:  Vitals:   05/12/21 1610  BP: (!) 153/93  Pulse: 65  Weight: 159 lb 9.6 oz (72.4 kg)  Height: 5' 2.5" (1.588 m)   Body mass index is 28.73 kg/m. No results found. Patient is in no distress; well developed, nourished and groomed; neck is supple MASKED FACIES  CARDIOVASCULAR: Examination of carotid arteries is normal; no carotid bruits Regular rate and rhythm, no murmurs Examination of peripheral vascular system by observation and palpation is normal  EYES: Ophthalmoscopic exam of optic discs and posterior segments is normal; no papilledema or hemorrhages  MUSCULOSKELETAL: Gait, strength, tone, movements noted in Neurologic exam below  NEUROLOGIC: MENTAL STATUS:  MMSE - Mini Mental State Exam 05/12/2021  Orientation to time 5  Orientation to Place 5  Registration 3  Attention/ Calculation 2  Recall 3  Language- name 2 objects 2  Language- repeat 1  Language- follow 3 step command 3  Language- read & follow direction 1  Write a sentence 1  Copy design 1  Total score 27   awake, alert, oriented to person, place and time recent and remote memory intact normal attention and concentration language fluent, comprehension intact, naming intact fund of knowledge appropriate  CRANIAL NERVE:  2nd - no papilledema on fundoscopic exam 2nd, 3rd, 4th, 6th - pupils equal and reactive to light, visual fields full to confrontation, extraocular muscles intact, no nystagmus 5th - facial sensation symmetric 7th - facial strength symmetric 8th - hearing intact 9th - palate elevates symmetrically, uvula midline 11th - shoulder shrug symmetric 12th - tongue protrusion midline  MOTOR:  normal bulk and tone, full strength in the BUE, BLE TRIGGER FINGER IN  RIGHT HAND DIGIT 5  SENSORY:  normal and symmetric to light touch  COORDINATION:  finger-nose-finger, fine finger movements normal  REFLEXES:  deep tendon reflexes  TRACE and symmetric  GAIT/STATION:  narrow based gait    DIAGNOSTIC DATA (LABS, IMAGING, TESTING) - I reviewed patient records, labs, notes, testing and imaging myself where available.  Lab Results  Component Value Date   WBC 6.1 07/21/2019   HGB 12.6 07/21/2019   HCT 39.5 07/21/2019   MCV 95.9 07/21/2019   PLT 188 07/21/2019      Component Value Date/Time   NA 144 07/21/2019 0932   K 4.0 07/21/2019 0932   CL 111 07/21/2019 0932   CO2 26 07/21/2019 0932   GLUCOSE 109 (H) 07/21/2019 0932   BUN 20 07/21/2019 0932   CREATININE 0.74 07/21/2019 0932   CALCIUM 8.8 (L) 07/21/2019 0932   PROT 6.8 02/06/2019 1341   ALBUMIN 4.3 02/06/2019 1341   AST 19 02/06/2019 1341   ALT 14 02/06/2019 1341   ALKPHOS 65 02/06/2019 1341   BILITOT 0.2 (L) 02/06/2019 1341   GFRNONAA >60 07/21/2019 0932   GFRAA >60 07/21/2019 0932   No results found for: CHOL, HDL, LDLCALC, LDLDIRECT, TRIG, CHOLHDL No results found for: HGBA1C No results found for: VITAMINB12 No results found for: TSH  06/03/14 CT head [I reviewed images myself and agree with interpretation. -VRP]  - normal   05/24/13 MRI brain [I reviewed images myself and agree with interpretation. -VRP]  1.  No acute intracranial abnormality. 2. Volume loss and signal changes in the left mesial temporal lobe compatible with mesial temporal sclerosis. 3. Otherwise negative MRI appearance of the brain.    ASSESSMENT AND PLAN  71 y.o. year old female here with history of seizure disorder since age 49-80 years old, diagnosed at [facility] in June 2015 with frontal lobe epilepsy through VEEG. Last seizure on June 2015 (during [facility] video EEG). Also with atypical event on 12/31/15 (syncope vs seizure). Also with some intermixed non-epileptic spells. Also with stress / anxiety issues.   New spells in June 2019, not clearly epileptic seizures.    Dx:  Partial symptomatic epilepsy with complex partial seizures, intractable, without  status epilepticus ([place]) - Plan: lacosamide (VIMPAT) 200 MG TABS tablet, topiramate (TOPAMAX) 100 MG tablet    PLAN:   SEIZURE DISORDER (gutteral sounds, slow response, frontal lobe localization) + non-epileptic spells (oral dyskinetic activity) - continue vimpat 200mg  twice a day  - continue topiramate; reduce slightly from 125 to 100mg  twice a day   RESTLESS LEG SYNDROME - continue ropinirole (1mg  in AM, 2mg  in PM) for RLS  ANXIETY - follow up with psychiatry / psychology for stress/anxiety issues - no more diazepam refills from here (only per psychiatry)   Meds ordered this encounter  Medications   lacosamide (VIMPAT) 200 MG TABS tablet    Sig: Take 1 tablet (200 mg total) by mouth 2 (two) times daily.    Dispense:  180 tablet    Refill:  4    This request is for a new prescription for a controlled substance as required by Federal/State law.   topiramate (TOPAMAX) 100 MG tablet    Sig: Take 1 tablet (100 mg total) by mouth 2 (two) times daily.    Dispense:  180 tablet    Refill:  4   Return in about 1 year (around 05/12/2022) for MyChart visit (15 min).    Penni Bombard, MD 1/61/0960, 4:54 PM Certified in  Neurology, Neurophysiology and Neuroimaging  [facility] [address] [place], [place] [address] [phone]

## 2021-05-12 NOTE — Patient Instructions (Signed)
°-   continue vimpat 200mg  twice a day   - continue topiramate; reduce slightly from 125 to 100mg  twice a day

## 2021-05-21 ENCOUNTER — Ambulatory Visit: Payer: Medicare HMO | Admitting: Diagnostic Neuroimaging

## 2021-06-04 ENCOUNTER — Telehealth: Payer: Self-pay | Admitting: Diagnostic Neuroimaging

## 2021-06-04 MED ORDER — TOPIRAMATE 25 MG PO TABS: 25.0000 mg | ORAL_TABLET | Freq: Two times a day (BID) | ORAL | 0 refills | Status: DC

## 2021-06-04 NOTE — Telephone Encounter (Signed)
Pt has called to report that the other night she had seizure like activity and although Dr Leta Baptist took her off of topiramate (TOPAMAX) 25 MG tablet she started back the other day and wanted him to be aware.  Pt said since she has been back on it she has had a little upset stomach but unsure if that is from the topiramate (TOPAMAX) 25 MG tablet, please call.

## 2021-06-04 NOTE — Telephone Encounter (Signed)
I called the pt, she sts on 06/02/21 she awoke from sleep sat straight up in the bed and was drooling uncontrollably. She reports she recalls her activity but felt like she had no control over it. Report sx lasted for a about a minute.   On 05/12/2021 slight change to topiramate  was made: continue topiramate; reduce slightly from 125 to 100mg  twice a day. Reports since this change she has not been sleeping well either.  Pt reports last night she added back 1 of the topiramate 25 mg tablets and felt like she rested better and felt like her self this morning.  Wanted to know if she should go back to the 125 mg dosage bid?  Discussed with Dr. Leta Baptist verbally and he agreed to going back to the 125 mg bid of Topiramate. Relayed instructions to pt and she verbalized understanding/ agreement.   Pt will calls back if any other concerns come up.

## 2021-06-12 ENCOUNTER — Encounter (HOSPITAL_COMMUNITY): Payer: Self-pay | Admitting: Emergency Medicine

## 2021-06-12 ENCOUNTER — Emergency Department (HOSPITAL_COMMUNITY): Payer: Medicare Other

## 2021-06-12 ENCOUNTER — Emergency Department (HOSPITAL_COMMUNITY)
Admission: EM | Admit: 2021-06-12 | Discharge: 2021-06-12 | Disposition: A | Discharge status: Home / Self Care | Payer: Medicare Other | Attending: Emergency Medicine | Admitting: Emergency Medicine

## 2021-06-12 ENCOUNTER — Other Ambulatory Visit: Payer: Self-pay

## 2021-06-12 ENCOUNTER — Emergency Department (HOSPITAL_BASED_OUTPATIENT_CLINIC_OR_DEPARTMENT_OTHER): Payer: Medicare Other

## 2021-06-12 DIAGNOSIS — F419 Anxiety disorder, unspecified: Secondary | ICD-10-CM | POA: Insufficient documentation

## 2021-06-12 DIAGNOSIS — R0602 Shortness of breath: Secondary | ICD-10-CM | POA: Insufficient documentation

## 2021-06-12 DIAGNOSIS — R2242 Localized swelling, mass and lump, left lower limb: Secondary | ICD-10-CM | POA: Diagnosis not present

## 2021-06-12 DIAGNOSIS — R569 Unspecified convulsions: Secondary | ICD-10-CM | POA: Diagnosis present

## 2021-06-12 DIAGNOSIS — Z20822 Contact with and (suspected) exposure to covid-19: Secondary | ICD-10-CM | POA: Insufficient documentation

## 2021-06-12 DIAGNOSIS — Z91010 Allergy to peanuts: Secondary | ICD-10-CM | POA: Insufficient documentation

## 2021-06-12 DIAGNOSIS — Z79899 Other long term (current) drug therapy: Secondary | ICD-10-CM | POA: Diagnosis not present

## 2021-06-12 DIAGNOSIS — M7989 Other specified soft tissue disorders: Secondary | ICD-10-CM

## 2021-06-12 DIAGNOSIS — G40909 Epilepsy, unspecified, not intractable, without status epilepticus: Secondary | ICD-10-CM

## 2021-06-12 LAB — URINALYSIS, ROUTINE W REFLEX MICROSCOPIC
Bacteria, UA: NONE SEEN
Bilirubin Urine: NEGATIVE
Glucose, UA: NEGATIVE mg/dL
Hgb urine dipstick: NEGATIVE
Ketones, ur: NEGATIVE mg/dL
Nitrite: NEGATIVE
Protein, ur: NEGATIVE mg/dL
Specific Gravity, Urine: 1.008 (ref 1.005–1.030)
pH: 8 (ref 5.0–8.0)

## 2021-06-12 LAB — COMPREHENSIVE METABOLIC PANEL
ALT: 9 U/L (ref 0–44)
AST: 28 U/L (ref 15–41)
Albumin: 3.2 g/dL — ABNORMAL LOW (ref 3.5–5.0)
Alkaline Phosphatase: 33 U/L — ABNORMAL LOW (ref 38–126)
Anion gap: 6 (ref 5–15)
BUN: 14 mg/dL (ref 8–23)
CO2: 22 mmol/L (ref 22–32)
Calcium: 7.4 mg/dL — ABNORMAL LOW (ref 8.9–10.3)
Chloride: 116 mmol/L — ABNORMAL HIGH (ref 98–111)
Creatinine, Ser: 0.45 mg/dL (ref 0.44–1.00)
GFR, Estimated: >60 mL/min (ref >60)
Glucose, Bld: 90 mg/dL (ref 70–99)
Potassium: 4.1 mmol/L (ref 3.5–5.1)
Sodium: 144 mmol/L (ref 135–145)
Total Bilirubin: 0.7 mg/dL (ref 0.3–1.2)
Total Protein: 5.4 g/dL — ABNORMAL LOW (ref 6.5–8.1)

## 2021-06-12 LAB — CBC WITH DIFFERENTIAL/PLATELET
Abs Immature Granulocytes: 0.01 10*3/uL (ref 0.00–0.07)
Basophils Absolute: 0 10*3/uL (ref 0.0–0.1)
Basophils Relative: 1 %
Eosinophils Absolute: 0.1 10*3/uL (ref 0.0–0.5)
Eosinophils Relative: 2 %
HCT: 31.7 % — ABNORMAL LOW (ref 36.0–46.0)
Hemoglobin: 10.4 g/dL — ABNORMAL LOW (ref 12.0–15.0)
Immature Granulocytes: 0 %
Lymphocytes Relative: 22 %
Lymphs Abs: 1.1 10*3/uL (ref 0.7–4.0)
MCH: 30.2 pg (ref 26.0–34.0)
MCHC: 32.8 g/dL (ref 30.0–36.0)
MCV: 92.2 fL (ref 80.0–100.0)
Monocytes Absolute: 0.4 10*3/uL (ref 0.1–1.0)
Monocytes Relative: 8 %
Neutro Abs: 3.3 10*3/uL (ref 1.7–7.7)
Neutrophils Relative %: 67 %
Platelets: 153 10*3/uL (ref 150–400)
RBC: 3.44 MIL/uL — ABNORMAL LOW (ref 3.87–5.11)
RDW: 12.4 % (ref 11.5–15.5)
WBC: 4.9 10*3/uL (ref 4.0–10.5)
nRBC: 0 % (ref 0.0–0.2)

## 2021-06-12 LAB — RESP PANEL BY RT-PCR (FLU A&B, COVID) ARPGX2
Influenza A by PCR: NEGATIVE
Influenza B by PCR: NEGATIVE
SARS Coronavirus 2 by RT PCR: NEGATIVE

## 2021-06-12 LAB — BRAIN NATRIURETIC PEPTIDE: B Natriuretic Peptide: 132.4 pg/mL — ABNORMAL HIGH (ref 0.0–100.0)

## 2021-06-12 LAB — TROPONIN I (HIGH SENSITIVITY): Troponin I (High Sensitivity): 6 ng/L (ref <18)

## 2021-06-12 MED ORDER — LORAZEPAM 1 MG PO TABS: 1.0000 mg | ORAL_TABLET | Freq: Two times a day (BID) | ORAL | 0 refills | Status: AC | PRN

## 2021-06-12 MED ORDER — LORAZEPAM 2 MG/ML IJ SOLN
0.5000 mg | Freq: Once | INTRAMUSCULAR | Status: AC
  Administered 2021-06-12: 0.5 mg via INTRAVENOUS
  Filled 2021-06-12: qty 1

## 2021-06-12 NOTE — Discharge Instructions (Signed)
1.  Call your neurologist tomorrow to discuss your symptoms. ?2.  Also call your family doctor and discuss anxiety and possible treatment options.  You have been given a prescription for Ativan to use sparingly for episodes of severe anxiety. ?3.  Return to emergency department if you have new or worsening symptoms. ?

## 2021-06-12 NOTE — ED Triage Notes (Signed)
Patient presents from home due to multiple focal seizures today starting at 1130. It had been 2 years prior to today since she had a seizure. She isn't currently taking medication.  ? ? ?EMS vitals: ?168/88 BP ?80 HR ?97% SPO2 on room air ?93 CBG ? ?

## 2021-06-12 NOTE — ED Provider Notes (Signed)
[facility] DEPT Provider Note   CSN: [id number] Arrival date & time: 06/12/21  1256     History  Chief Complaint  Patient presents with   Seizures    Teresa Nguyen is a 71 y.o. female.  HPI Patient has history of seizure disorder.  Extensive review of EMR indicates some variable seizure type diagnoses.  Patient has been on anticonvulsants since childhood for partial seizures.  However, at one point diagnosis appears to have been nonepileptic spells.  Subsequently there is a 2015 notation from do showing frontal hypermotor seizures conclusion that patient had frontal lobe epilepsy.  Patient reports that she sees Dr. Leta Baptist and is currently being treated with Topamax 125 mg twice daily and Vimpat 200 mg twice daily.  Patient reports he has been doing well on this regimen for a number of years and has been seizure-free.  She does report however stress is a trigger for her seizures and she is under a lot more stress recently.  Although she lost her mother and her sisters her father a number of years ago over time, she is currently living in her mother's house now and sorting through years of family belongings.  Patient reports that she started to feel like she was not feeling normal.  She started to feel like she had to breathe really quickly and like she could not talk.  She felt that it was a seizure and this was typical.  She wanted to try to work through it but symptoms continue to escalate until she felt like she could not breathe and worried that she might pass out and therefore called 911.  Patient reports when she started transports she felt better.  Symptoms are now resolved.  She reports her breathing feels back to normal.  She does however note that she has a history of DVT and has noted that her left leg has been slightly more swollen recently.  She has been off of Eliquis greater than 5 years by physician recommendation.  The DVT had been very  distant.    Home Medications Prior to Admission medications   Medication Sig Start Date End Date Taking? Authorizing Provider  LORazepam (ATIVAN) 1 MG tablet Take 1 tablet (1 mg total) by mouth 2 (two) times daily as needed for anxiety. 06/12/21  Yes Charlesetta Shanks, MD  acetaminophen (TYLENOL) 500 MG tablet Take 500 mg by mouth 2 (two) times daily as needed for moderate pain or headache.    [provider]  amLODipine (NORVASC) 5 MG tablet Take 1 tablet (5 mg total) by mouth daily. 12/29/17   Dana Allan I, MD  atorvastatin (LIPITOR) 10 MG tablet Take 10 mg by mouth at bedtime.  05/04/13   [provider]  Calcium Carbonate-Vitamin D (CALCIUM-D PO) Take 2 tablets by mouth daily.     [provider]  Carboxymethylcellulose Sodium (REFRESH LIQUIGEL OP) Place 1 drop into both eyes daily as needed (dry eyes).    [provider]  ciclopirox (PENLAC) 8 % solution Apply topically at bedtime. Apply over nail and surrounding skin. Apply daily over previous coat. After seven (7) days, may remove with alcohol and continue cycle. 08/06/20   McDonald, Stephan Minister, DPM  cycloSPORINE (RESTASIS) 0.05 % ophthalmic emulsion 1 drop 2 (two) times daily. 04/20/21   [provider]  Desvenlafaxine Succinate ER 25 MG TB24 Take 25 mg by mouth daily.  01/11/18   [provider]  diazepam (VALIUM) 10 MG tablet Take 10  mg by mouth daily as needed (seizure).    [provider]  diclofenac sodium (VOLTAREN) 1 % GEL Apply 1 application topically at bedtime. 02/07/18   [provider]  fluticasone (FLONASE) 50 MCG/ACT nasal spray Place 2 sprays into both nostrils at bedtime as needed for allergies.     [provider]  lacosamide (VIMPAT) 200 MG TABS tablet Take 1 tablet (200 mg total) by mouth 2 (two) times daily. 05/12/21   Penumalli, Earlean Polka, MD  levothyroxine (SYNTHROID, LEVOTHROID) 25 MCG tablet Take 25 mcg by mouth daily before breakfast.  05/04/13    [provider]  lidocaine-prilocaine (EMLA) cream Apply 1 application topically as needed. 05/28/20   McDonald, Stephan Minister, DPM  loperamide (IMODIUM A-D) 2 MG tablet Take 1 mg by mouth 4 (four) times daily as needed for diarrhea or loose stools.    [provider]  meloxicam (MOBIC) 7.5 MG tablet  07/28/19   [provider]  methocarbamol (ROBAXIN) 750 MG tablet Take 1 tablet (750 mg total) by mouth 2 (two) times daily as needed for muscle spasms. Patient taking differently: Take 750 mg by mouth at bedtime. 12/28/17   Bonnell Public, MD  Multiple Vitamins-Minerals (MULTIVITAMIN ADULT) TABS Take 1 tablet by mouth daily.    [provider]  Omega-3 Fatty Acids (FISH OIL) 1000 MG CAPS Take 1,000 mg by mouth in the morning and at bedtime.    [provider]  polyethylene glycol (MIRALAX / GLYCOLAX) packet Take 4.25 g by mouth daily.     [provider]  rOPINIRole (REQUIP) 1 MG tablet Take 1 tablet (1 mg total) by mouth daily. 12/29/17   Bonnell Public, MD  sodium chloride (OCEAN) 0.65 % SOLN nasal spray Place 1 spray into both nostrils as needed for congestion.    [provider]  topiramate (TOPAMAX) 100 MG tablet Take 1 tablet (100 mg total) by mouth 2 (two) times daily. 05/12/21   Penumalli, Earlean Polka, MD  topiramate (TOPAMAX) 25 MG tablet Take 1 tablet (25 mg total) by mouth 2 (two) times daily. 06/04/21   Penumalli, Earlean Polka, MD  vitamin E 400 UNIT capsule Take 400 Units by mouth at bedtime.    [provider]      Allergies    Peanut oil, Other, and Sulfa antibiotics    Review of Systems   Review of Systems 10 Systems reviewed and negative except as per HPI Physical Exam Updated Vital Signs BP (!) 144/81    Pulse 70    Resp 20    SpO2 97%  Physical Exam Constitutional:      Appearance: Normal appearance.  HENT:     Head: Normocephalic and atraumatic.     Mouth/Throat:     Mouth: Mucous membranes are moist.      Pharynx: Oropharynx is clear.  Eyes:     Extraocular Movements: Extraocular movements intact.  Cardiovascular:     Rate and Rhythm: Normal rate and regular rhythm.  Pulmonary:     Effort: Pulmonary effort is normal.     Breath sounds: Normal breath sounds.  Abdominal:     General: There is no distension.     Palpations: Abdomen is soft.     Tenderness: There is no abdominal tenderness. There is no guarding.  Musculoskeletal:        General: Normal range of motion.     Comments: Mild objective swelling of the left lower extremity relative to right.  Both feet  warm and dry with 2+ pulses  Skin:    General: Skin is warm and dry.  Neurological:     General: No focal deficit present.     Mental Status: She is alert and oriented to person, place, and time.     Motor: No weakness.     Coordination: Coordination normal.  Psychiatric:        Mood and Affect: Mood normal.    ED Results / Procedures / Treatments   Labs (all labs ordered are listed, but only abnormal results are displayed) Labs Reviewed  BRAIN NATRIURETIC PEPTIDE - Abnormal; Notable for the following components:      Result Value   B Natriuretic Peptide 132.4 (*)    All other components within normal limits  COMPREHENSIVE METABOLIC PANEL - Abnormal; Notable for the following components:   Chloride 116 (*)    Calcium 7.4 (*)    Total Protein 5.4 (*)    Albumin 3.2 (*)    Alkaline Phosphatase 33 (*)    All other components within normal limits  CBC WITH DIFFERENTIAL/PLATELET - Abnormal; Notable for the following components:   RBC 3.44 (*)    Hemoglobin 10.4 (*)    HCT 31.7 (*)    All other components within normal limits  URINALYSIS, ROUTINE W REFLEX MICROSCOPIC - Abnormal; Notable for the following components:   APPearance CLOUDY (*)    Leukocytes,Ua TRACE (*)    All other components within normal limits  RESP PANEL BY RT-PCR (FLU A&B, COVID) ARPGX2  TROPONIN I (HIGH SENSITIVITY)  TROPONIN I (HIGH SENSITIVITY)     EKG EKG Interpretation  Date/Time:  Thursday June 12 2021 15:44:54 EST Ventricular Rate:  65 PR Interval:  157 QRS Duration: 98 QT Interval:  396 QTC Calculation: 412 R Axis:   -27 Text Interpretation: Sinus rhythm Probable left atrial enlargement Borderline left axis deviation Low voltage, precordial leads 12 Lead; Mason-Likar normal, no acute ischemic appearance Confirmed by Charlesetta Shanks [phone]) on 06/12/2021 4:57:00 PM  Radiology DG Chest 2 View  Result Date: 06/12/2021 CLINICAL DATA:  Seizures, shortness of breath EXAM: CHEST - 2 VIEW COMPARISON:  02/08/2019 FINDINGS: Frontal and lateral views of the chest demonstrate an unremarkable cardiac silhouette. No airspace disease, effusion, or pneumothorax. No acute bony abnormality. IMPRESSION: 1. No acute intrathoracic process. Electronically Signed   By: Randa Ngo M.D.   On: 06/12/2021 15:51   VAS [place] LOWER EXTREMITY VENOUS (DVT) (7a-7p)  Result Date: 06/12/2021  Lower Venous DVT Study Patient Name:  CIANI RUTTEN  Date of Exam:   06/12/2021 Medical Rec #: [id number]        Accession #:    [id number] Date of Birth: [birth year]        Patient Gender: F Patient Age:   31 years Exam Location:  [facility] Procedure:      VAS [place] LOWER EXTREMITY VENOUS (DVT) Referring Phys: [place] Crissa Sowder --------------------------------------------------------------------------------  Indications: Swelling.  Comparison Study: No previous exams Performing Technologist: Jody Hill RVT, RDMS  Examination Guidelines: A complete evaluation includes B-mode imaging, spectral Doppler, color Doppler, and power Doppler as needed of all accessible portions of each vessel. Bilateral testing is considered an integral part of a complete examination. Limited examinations for reoccurring indications may be performed as noted. The reflux portion of the exam is performed with the patient in reverse Trendelenburg.   +-----+---------------+---------+-----------+----------+--------------+  RIGHT Compressibility Phasicity Spontaneity Properties Thrombus Aging  +-----+---------------+---------+-----------+----------+--------------+  CFV   Full  Yes       Yes                                    +-----+---------------+---------+-----------+----------+--------------+   +---------+---------------+---------+-----------+----------+--------------+  LEFT      Compressibility Phasicity Spontaneity Properties Thrombus Aging  +---------+---------------+---------+-----------+----------+--------------+  CFV       Full            Yes       Yes                                    +---------+---------------+---------+-----------+----------+--------------+  SFJ       Full                                                             +---------+---------------+---------+-----------+----------+--------------+  FV Prox   Full            Yes       Yes                                    +---------+---------------+---------+-----------+----------+--------------+  FV Mid    Full            Yes       Yes                                    +---------+---------------+---------+-----------+----------+--------------+  FV Distal Full            Yes       Yes                                    +---------+---------------+---------+-----------+----------+--------------+  PFV       Full                                                             +---------+---------------+---------+-----------+----------+--------------+  POP       Full            Yes       Yes                                    +---------+---------------+---------+-----------+----------+--------------+  PTV       Full                                                             +---------+---------------+---------+-----------+----------+--------------+  PERO      Full                                                              +---------+---------------+---------+-----------+----------+--------------+  Summary: RIGHT: - No evidence of common femoral vein obstruction.  LEFT: - There is no evidence of deep vein thrombosis in the lower extremity. - There is no evidence of superficial venous thrombosis.  - No cystic structure found in the popliteal fossa.  *See table(s) above for measurements and observations.    Preliminary     Procedures Procedures    Medications Ordered in ED Medications  LORazepam (ATIVAN) injection 0.5 mg (has no administration in time range)    ED Course/ Medical Decision Making/ A&P                           Medical Decision Making Amount and/or Complexity of Data Reviewed Labs: ordered. Radiology: ordered. ECG/medicine tests: ordered.  Risk Prescription drug management.  Patient describes seizure disorder.  She felt symptoms this evening were consistent with prior manifestation of seizures  Diagnostic evaluation included further evaluation for complaint of shortness of breath although this was something that she has experienced previously with seizure.  She describes swelling of her lower leg and distant history of DVT.  DVT study negative.  Clinically patient does not show signs of pulmonary embolus  Patient did have episode that she reports was consistent with what she experienced earlier in the day and the manifestation of this was some hyperventilating and difficulty making words although the patient would write things down during the episode.  At this time I feel this is likely panic induced.  Patient describes a lot of current stressors.  Will provide short course of Ativan to control severe anxiety that appears situational and triggering of seizure-like activity.  At this time I do believe patient is stable for continued outpatient management with close follow-up.        Final Clinical Impression(s) / ED Diagnoses Final diagnoses:  Seizure disorder [facility])  Anxiety     Rx / DC Orders ED Discharge Orders          Ordered    LORazepam (ATIVAN) 1 MG tablet  2 times daily PRN        06/12/21 1729              Charlesetta Shanks, MD 06/18/21 0000

## 2021-06-12 NOTE — ED Notes (Signed)
Patient reported she felt like she was about to have a seizure while the RN was in the room. The patient then practiced her breathing exercises. Seizure did not occur.  ?

## 2021-06-17 NOTE — Telephone Encounter (Signed)
Called # documented; number no longer in service. Called # per pharmacy search, spoke with pharmacist who asked for clarification of both Topiramate Rx. I advised her patient is supposed to be taking 100 mg twice daily and 25 mg twice daily (total 125 mg each dose). She verbalized understanding, appreciation. ?  ?

## 2021-06-17 NOTE — Telephone Encounter (Signed)
[facility]) verify direction for topiramate (TOPAMAX) 25 MG tablet. Would like a call from the nurse. ? ?Contact info: [phone] ?

## 2021-06-18 ENCOUNTER — Other Ambulatory Visit: Payer: Self-pay | Admitting: Diagnostic Neuroimaging

## 2021-06-18 ENCOUNTER — Telehealth: Payer: Self-pay | Admitting: Diagnostic Neuroimaging

## 2021-06-18 MED ORDER — TOPIRAMATE 25 MG PO TABS: 25.0000 mg | ORAL_TABLET | Freq: Two times a day (BID) | ORAL | 0 refills | Status: DC

## 2021-06-18 NOTE — Telephone Encounter (Signed)
Called patient who stated [facility] dose needs Rx for topiramate 25 mg. I advised her I will send it in. Patient verbalized understanding, appreciation. ? ?

## 2021-06-18 NOTE — Telephone Encounter (Signed)
Pt would like a call prescription transferred to [facility] ?Pt did not know pharmacy's contact info. She will call back with information ?

## 2021-06-21 ENCOUNTER — Other Ambulatory Visit: Payer: Self-pay | Admitting: Diagnostic Neuroimaging

## 2021-06-25 ENCOUNTER — Other Ambulatory Visit: Payer: Self-pay | Admitting: Diagnostic Neuroimaging

## 2021-06-26 ENCOUNTER — Ambulatory Visit: Payer: Medicare HMO | Admitting: Family Medicine

## 2021-06-27 ENCOUNTER — Other Ambulatory Visit: Payer: Self-pay | Admitting: Diagnostic Neuroimaging

## 2021-09-29 ENCOUNTER — Other Ambulatory Visit: Payer: Self-pay | Admitting: Internal Medicine

## 2021-09-29 DIAGNOSIS — Z1231 Encounter for screening mammogram for malignant neoplasm of breast: Secondary | ICD-10-CM

## 2021-10-03 ENCOUNTER — Ambulatory Visit
Admission: RE | Admit: 2021-10-03 | Discharge: 2021-10-03 | Disposition: A | Discharge status: Home / Self Care | Payer: Medicare Other | Source: Ambulatory Visit | Attending: Internal Medicine | Admitting: Internal Medicine

## 2021-10-03 DIAGNOSIS — Z1231 Encounter for screening mammogram for malignant neoplasm of breast: Secondary | ICD-10-CM

## 2021-10-10 ENCOUNTER — Other Ambulatory Visit: Payer: Self-pay | Admitting: Diagnostic Neuroimaging

## 2021-10-10 DIAGNOSIS — G40219 Localization-related (focal) (partial) symptomatic epilepsy and epileptic syndromes with complex partial seizures, intractable, without status epilepticus: Secondary | ICD-10-CM

## 2022-03-11 ENCOUNTER — Ambulatory Visit (INDEPENDENT_AMBULATORY_CARE_PROVIDER_SITE_OTHER): Payer: Medicare Other

## 2022-03-11 ENCOUNTER — Ambulatory Visit (INDEPENDENT_AMBULATORY_CARE_PROVIDER_SITE_OTHER): Payer: Medicare Other | Admitting: Podiatry

## 2022-03-11 DIAGNOSIS — M5417 Radiculopathy, lumbosacral region: Secondary | ICD-10-CM

## 2022-03-11 DIAGNOSIS — M7671 Peroneal tendinitis, right leg: Secondary | ICD-10-CM | POA: Diagnosis not present

## 2022-03-11 DIAGNOSIS — M7672 Peroneal tendinitis, left leg: Secondary | ICD-10-CM

## 2022-03-11 DIAGNOSIS — G5752 Tarsal tunnel syndrome, left lower limb: Secondary | ICD-10-CM

## 2022-03-11 DIAGNOSIS — M7750 Other enthesopathy of unspecified foot: Secondary | ICD-10-CM

## 2022-03-11 DIAGNOSIS — M778 Other enthesopathies, not elsewhere classified: Secondary | ICD-10-CM | POA: Diagnosis not present

## 2022-03-11 DIAGNOSIS — M5432 Sciatica, left side: Secondary | ICD-10-CM

## 2022-03-11 MED ORDER — LIDOCAINE-PRILOCAINE 2.5-2.5 % EX CREA: 1.0000 | TOPICAL_CREAM | CUTANEOUS | 2 refills | Status: DC | PRN

## 2022-03-11 NOTE — Patient Instructions (Signed)

## 2022-03-12 NOTE — Progress Notes (Signed)
  Subjective:  Patient ID: Teresa Nguyen, female    DOB: 24-Mar-1951,  MRN: [id number]  Chief Complaint  Patient presents with   Foot Pain    Bilateral  foot pain coming up leg( left pain up to knee)    72 y.o. female presents with the above complaint. History confirmed with patient.  She turns for follow-up she is doing better the numbness tingling is improving but still has random strange issues on both legs  Objective:  Physical Exam: warm, good capillary refill, no trophic changes or ulcerative lesions, normal DP and PT pulses, normal sensory exam, and most of her pain to palpation is around the peroneal tendons diffuse and irregular on the plantar feet.   Radiographs: Multiple views x-ray of the both feet show mild degenerative changes in the hindfoot, no new acute osseous abnormalities and unchanged since last x-rays Assessment:   1. Peroneal tendinitis of both lower legs   2. Lumbosacral radiculitis   3. Sciatica of left side   4. Tarsal tunnel syndrome, left      Plan:  Patient was evaluated and treated and all questions answered.  Discussed the etiology and treatment options for peroneal tendinitis including stretching, formal physical therapy with an eccentric exercises therapy plan, supportive shoegears such as a running shoe or sneaker, bracing, topical and oral medications.  We also discussed that I do not routinely perform injections in this area because of the risk of an increased damage or rupture of the tendon.  We also discussed the role of surgical treatment of this for patients who do not improve after exhausting non-surgical treatment options.  -XR reviewed with patient -Educated on stretching and icing of the affected limb. -Referral placed to physical therapy. -Recommend she use Voltaren gel, Emla cream Rx sent to pharmacy, she has diclofenac gel already  Return in about 8 weeks (around 05/06/2022) for re-check peroneal tendinitis.

## 2022-03-13 ENCOUNTER — Other Ambulatory Visit: Payer: Self-pay | Admitting: Diagnostic Neuroimaging

## 2022-03-13 DIAGNOSIS — G40219 Localization-related (focal) (partial) symptomatic epilepsy and epileptic syndromes with complex partial seizures, intractable, without status epilepticus: Secondary | ICD-10-CM

## 2022-03-18 ENCOUNTER — Other Ambulatory Visit: Payer: Self-pay | Admitting: Psychiatry

## 2022-03-18 DIAGNOSIS — G40219 Localization-related (focal) (partial) symptomatic epilepsy and epileptic syndromes with complex partial seizures, intractable, without status epilepticus: Secondary | ICD-10-CM

## 2022-04-25 ENCOUNTER — Other Ambulatory Visit: Payer: Self-pay | Admitting: Diagnostic Neuroimaging

## 2022-05-06 ENCOUNTER — Ambulatory Visit (INDEPENDENT_AMBULATORY_CARE_PROVIDER_SITE_OTHER): Payer: 59 | Admitting: Podiatry

## 2022-05-06 DIAGNOSIS — M5417 Radiculopathy, lumbosacral region: Secondary | ICD-10-CM

## 2022-05-06 DIAGNOSIS — G609 Hereditary and idiopathic neuropathy, unspecified: Secondary | ICD-10-CM | POA: Diagnosis not present

## 2022-05-06 MED ORDER — LIDOCAINE-PRILOCAINE 2.5-2.5 % EX CREA: 1.0000 | TOPICAL_CREAM | CUTANEOUS | 2 refills | Status: DC | PRN

## 2022-05-09 ENCOUNTER — Encounter: Payer: Self-pay | Admitting: Podiatry

## 2022-05-10 NOTE — Progress Notes (Signed)
  Subjective:  Patient ID: Teresa Nguyen, female    DOB: August 04, 1950,  MRN: [id number]  Chief Complaint  Patient presents with   Tendonitis    8 weeks for re-check peroneal tendinitis - not really any better - she tried to do the exercises a few times but was unable to continue. She has had some medication reactions lately and her primary care doctor is working on that    72 y.o. female presents with the above complaint. History confirmed with patient.  Today she returns for follow-up says she is having some pain in the left great toenail.  Pain across the ball of foot random heel pain happens.  Objective:  Physical Exam: warm, good capillary refill, no trophic changes or ulcerative lesions, normal DP and PT pulses, normal sensory exam, and today her pain is diffuse spread across the forefoot   Radiographs: Multiple views x-ray of the both feet show mild degenerative changes in the hindfoot, no new acute osseous abnormalities and unchanged since last x-rays Assessment:   1. Idiopathic peripheral neuropathy   2. Lumbosacral radiculitis      Plan:  Patient was evaluated and treated and all questions answered.  Today her pain and symptoms were different than the previous exam she has some irritation around the previous nail site as well as the ball of the foot.  Suspect she has some metatarsalgia, overall I think this is likely more so related to peripheral neuropathy and/or secondary lumbosacral issues.  May continue home physical therapy plan and topical medication such as Emla cream which I refilled and OTC Voltaren gel.  Return if symptoms worsen or fail to improve.

## 2022-05-12 ENCOUNTER — Encounter: Payer: Self-pay | Admitting: Diagnostic Neuroimaging

## 2022-05-12 ENCOUNTER — Telehealth (INDEPENDENT_AMBULATORY_CARE_PROVIDER_SITE_OTHER): Payer: 59 | Admitting: Diagnostic Neuroimaging

## 2022-05-12 ENCOUNTER — Other Ambulatory Visit: Payer: Self-pay

## 2022-05-12 DIAGNOSIS — G40219 Localization-related (focal) (partial) symptomatic epilepsy and epileptic syndromes with complex partial seizures, intractable, without status epilepticus: Secondary | ICD-10-CM | POA: Diagnosis not present

## 2022-05-12 MED ORDER — LACOSAMIDE 200 MG PO TABS: 200.0000 mg | ORAL_TABLET | Freq: Two times a day (BID) | ORAL | 5 refills | Status: DC

## 2022-05-12 MED ORDER — TOPIRAMATE 100 MG PO TABS: 100.0000 mg | ORAL_TABLET | Freq: Two times a day (BID) | ORAL | 4 refills | Status: DC

## 2022-05-12 NOTE — Progress Notes (Signed)
[facility]  PATIENT: Teresa Nguyen DOB: Dec 22, 1950  REFERRING CLINICIAN: Donnamarie Poag Bebe Shaggy, MD  HISTORY FROM: patient  REASON FOR VISIT: follow up    HISTORICAL  CHIEF COMPLAINT:  Chief Complaint  Patient presents with   Memory Loss   Seizures    HISTORY OF PRESENT ILLNESS:   UPDATE (05/12/22, VRP): Since last visit, doing about the same. Did not stop the '25mg'$  topiramate as per last visit. No alleviating or aggravating factors. Tolerating meds.    UPDATE (05/12/21, VRP): Since last visit, here for evaluation of memory issues. Mild memory lapses, but no changes in ADLs. In fact, she is able to take care of herself and her neighbors. No spells or seizures.   UPDATE (05/03/18, Dr. Rona Ravens, [facility]): 72 year old woman with history of intractable, likely frontal lobe epilepsy. EMU monitoring in 2015 captured three stereotyped events with right temporal slowing and sharp waves. Epileptic seizures, with onset since childhood, consist mostly of oral automatisms with various degrees of evolution. They have been well controlled with LCM, 200 BID, along with TPM 125 BID. She previously failed PB, PHT, LTG, and ZON. Of note, video EEG in 2015 also caught an event with oral dyskinesia that was thought to be nonepileptic. These are more rare.    UPDATE (12/29/17, VRP): Since last visit, doing well until 12/21/17 and 12/27/17 --> chest muscle spasm, difficulty talking, but no LOC. Each attack lasted 10-20 seconds, 30-50 attacks. Went to ER on 12/21/17. Then again to ER in 12/27/17. Had been taking CBZ for 3 months prior, but felt like it was triggering seizures, so she stopped in early Sept 2019. Mood is stable.   UPDATE (10/11/17, VRP): Since last visit, doing well until 1-2 weeks ago, woke up in night with chest muscle spasm; lasted 30 minutes. Then again over next 2 nights, 30-45 minutes. Also last night with similar events. No LOC or altered consciousness.  No aphasia. Tolerating meds. Having  more GI issues (diarrhea) in last few months.   UPDATE (03/17/17, VRP): Since last visit, doing well. Tolerating meds. No alleviating or aggravating factors. No seizures. Has seen Dr. Noemi Chapel (psychiatry) and was started on Equetro (carbamazepine ER samples).   UPDATE 08/25/16: Since last visit, no seizures. Had some "aura" sensations which consistent of "pulse" in the head sensation, esp with stress. Tolerating meds.   UPDATE 12/31/15: Last night forgot vimpat dose; this morning at 4am, woke up, had to urinate, stood up and then fell back on to bed, may have had brief LOC. Thinks she had a seizure. No post-ictal confusion.  No tongue biting. Restless legs stable with medication.   UPDATE 12/31/14: Since last visit, doing well, no seizures. Used valium '10mg'$  x 1 time in the last year for a seizure aura. Tolerating vimpat '200mg'$  BID + TPX '100mg'$  BID. Went to ER in Feb for syncope (dx with dehydration).   UPDATE 12/29/13 (LL): Since last visit, went to [facility] to have EMU monitoring. EMU report --> June 15-18, 2015: "Long-term video EEG monitoring captured multiple events; including electrographic frontal hypermotor seizures. She also exhibited episodes of oral dyskinetic activity which is not seizure (no EEG correlate)." It was concluded that her typical arousal events are consistent with frontal lobe epilepsy. Home medications Vimpat 200 mg twice a day,Topirimate 100 mg twice a day were resumed, but they recommended reducing home dose of Valium as she was taking 10-20 mg when necessary which likely to affects her affect and impairs her communication.  She has had several close family members who have died in the last year, including her mother and her sister who died suddenly. Patient is very content on her current medication regimen, stating that she has felt better on Vimpat than ever before. She has not had many nocturnal seizures since starting Vimpat, and states that she was afraid to go to sleep prior to  starting it. Her past AEDs have been phenobarbital, phenytoin, Lacosamide, lamotrigine, levetiracetam, Topirimate, valproic acid and zonisamide. Known seizure triggers are eating broccoli or eggplant. RLS symptoms are controlled on Ropinirole, although some nights she must take an extra 0.25 of a tablet to be comfortable.  UPDATE 12/28/12: Outside neurologist records received and reviewed. 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 Since last visit, no more seizures or spells. Her RLS is getting worse, b/c she is running out of ropinirole and was reducing the dose. Still with high stress. Not interested in seeing psychiatry.   UPDATE 06/10/12: Since last visit, sz stopped, until Dec 2013. She was preparing for colonoscopy, and anticipated having diarrhea from bowel prep. Before she took the bowel prep meds, she started with diarrhea. Soon after she had a seizure (no LOC, eye up, head turned, mouth twitching; no generalized convulsions. she could hear and move, but not talk). She took a valium, and her spell stopped. I still haven't received records from prior neurologist. Patient tells me that she was dx'd with "partial non-epileptic seizures". She was told to take valium prn to stop her seizures at home, in order to avoid freq ER trips.    PRIOR HPI (12/04/11): 72 year old right-handed female here for evaluation of seizure disorder. At age 78 years old, patient fell down a flight of stairs. Soon after she began to have "petit mall seizures". Patient states she was having inability to speak, convulsions and tongue biting. She started on Dilantin and phenobarbital early in life. Over the years she tried "many different seizure medications". She was living in [place] for most of her life and followed by neurology there. She had extensive testing with EEG, video EEG, sleep study, MRI, without any significant abnormalities. Unclear if any of these seizures were captured during EEG  recordings. It sounds like no epileptiform discharges were ever noted. Unfortunately I do not have any of her prior records to review myself for this visit. Patient has been on Vimpat and topiramate over the past year, and now has one episode every 3 weeks of "seizure". Current episodes consist of gagging sensation, inability to speak, without loss of consciousness tongue biting or convulsions.    REVIEW OF SYSTEMS: Full 14 system review of systems performed and negative except: seizure drooling memory loss.   ALLERGIES: Allergies  Allergen Reactions   Peanut Oil Diarrhea    Nuts in general    Nickel Other (See Comments)   Other Diarrhea    Nuts and dark green vegetables cause diarrhea and dehydration   Sulfa Antibiotics Itching and Rash    Red all over    HOME MEDICATIONS: Outpatient Medications Prior to Visit  Medication Sig Dispense Refill   acetaminophen (TYLENOL) 500 MG tablet Take 500 mg by mouth 2 (two) times daily as needed for moderate pain or headache.     amLODipine (NORVASC) 5 MG tablet Take 1 tablet (5 mg total) by mouth daily. 30 tablet 0   atorvastatin (LIPITOR) 10 MG tablet Take 10 mg by mouth at bedtime.      Calcium Carbonate-Vitamin D (  CALCIUM-D PO) Take 2 tablets by mouth daily.      Carboxymethylcellulose Sodium (REFRESH LIQUIGEL OP) Place 1 drop into both eyes daily as needed (dry eyes).     cycloSPORINE (RESTASIS) 0.05 % ophthalmic emulsion 1 drop 2 (two) times daily.     diazepam (VALIUM) 10 MG tablet Take 10 mg by mouth daily as needed (seizure).     diclofenac sodium (VOLTAREN) 1 % GEL Apply 1 application topically at bedtime.     fluticasone (FLONASE) 50 MCG/ACT nasal spray Place 2 sprays into both nostrils at bedtime as needed for allergies.      levothyroxine (SYNTHROID, LEVOTHROID) 25 MCG tablet Take 25 mcg by mouth daily before breakfast.      lidocaine-prilocaine (EMLA) cream Apply 1 Application topically as needed. 60 g 2   loperamide (IMODIUM A-D) 2  MG tablet Take 1 mg by mouth 4 (four) times daily as needed for diarrhea or loose stools.     LORazepam (ATIVAN) 1 MG tablet Take 1 tablet (1 mg total) by mouth 2 (two) times daily as needed for anxiety. 15 tablet 0   meloxicam (MOBIC) 7.5 MG tablet      methocarbamol (ROBAXIN) 750 MG tablet Take 1 tablet (750 mg total) by mouth 2 (two) times daily as needed for muscle spasms. (Patient taking differently: Take 750 mg by mouth at bedtime.) 20 tablet 0   Multiple Vitamins-Minerals (MULTIVITAMIN ADULT) TABS Take 1 tablet by mouth daily.     Omega-3 Fatty Acids (FISH OIL) 1000 MG CAPS Take 1,000 mg by mouth in the morning and at bedtime.     polyethylene glycol (MIRALAX / GLYCOLAX) packet Take 4.25 g by mouth daily.      rOPINIRole (REQUIP) 1 MG tablet Take 1 tablet (1 mg total) by mouth daily. 30 tablet 0   sodium chloride (OCEAN) 0.65 % SOLN nasal spray Place 1 spray into both nostrils as needed for congestion.     vitamin E 400 UNIT capsule Take 400 Units by mouth at bedtime.     ciclopirox (PENLAC) 8 % solution Apply topically at bedtime. Apply over nail and surrounding skin. Apply daily over previous coat. After seven (7) days, may remove with alcohol and continue cycle. 6.6 mL 3   Desvenlafaxine Succinate ER 25 MG TB24 Take 25 mg by mouth daily.   1   lacosamide (VIMPAT) 200 MG TABS tablet Take 1 tablet by mouth twice daily 60 tablet 1   topiramate (TOPAMAX) 100 MG tablet Take 1 tablet by mouth twice daily 180 tablet 0   No facility-administered medications prior to visit.    PAST MEDICAL HISTORY: Past Medical History:  Diagnosis Date   Anxiety    Arthritis    Arthritis of neck    DVT (deep venous thrombosis) ([place]) 2008   L leg   Finger fracture, right    small   Major depressive disorder    pt denies   Migraine    Seizure disorder [facility])    since age 39-3, last sz 12/31/15   Thyroid disease    Wears partial dentures     PAST SURGICAL HISTORY: Past Surgical History:  Procedure  Laterality Date   BIOPSY  10/17/2018   Procedure: BIOPSY;  Surgeon: Wilford Corner, MD;  Location: [facility];  Service: Endoscopy;;   BREAST LUMPECTOMY Bilateral 1990   COLONOSCOPY WITH PROPOFOL N/A 10/17/2018   Procedure: COLONOSCOPY WITH PROPOFOL;  Surgeon: Wilford Corner, MD;  Location: [facility];  Service: Endoscopy;  Laterality: N/A;  ESOPHAGOGASTRODUODENOSCOPY (EGD) WITH PROPOFOL N/A 10/17/2018   Procedure: ESOPHAGOGASTRODUODENOSCOPY (EGD) WITH PROPOFOL;  Surgeon: Wilford Corner, MD;  Location: [facility];  Service: Endoscopy;  Laterality: N/A;   NASAL SINUS SURGERY     POLYPECTOMY  10/17/2018   Procedure: POLYPECTOMY;  Surgeon: Wilford Corner, MD;  Location: [facility];  Service: Endoscopy;;   TONSILLECTOMY     TUBAL LIGATION      FAMILY HISTORY: Family History  Problem Relation Age of Onset   Stroke Father    Heart attack Father    Throat cancer Sister    Seizures Sister    Migraines Sister    Diabetes Maternal Grandmother    Diabetes Maternal Grandfather    Kidney Stones Sister     SOCIAL HISTORY:  Social History   Socioeconomic History   Marital status: Divorced    Spouse name: Not on file   Number of children: 2   Years of education: 12th   Highest education level: Not on file  Occupational History   Occupation: disabled  Tobacco Use   Smoking status: Former    Types: Cigarettes    Quit date: 04/16/1998    Years since quitting: 24.0   Smokeless tobacco: Never  Vaping Use   Vaping Use: Never used  Substance and Sexual Activity   Alcohol use: Yes    Comment: occ. social   Drug use: Not Currently   Sexual activity: Not Currently    Birth control/protection: Post-menopausal  Other Topics Concern   Not on file  Social History Narrative   Patient lives at home alone.   Caffeine Use: 1 cup of coffee daily   Social Determinants of Health   Financial Resource Strain: Not on file  Food Insecurity: Not on file  Transportation Needs: Not on  file  Physical Activity: Not on file  Stress: Not on file  Social Connections: Not on file  Intimate Partner Violence: Not on file     PHYSICAL EXAM  GENERAL EXAM/CONSTITUTIONAL: Vitals:  There were no vitals filed for this visit.  There is no height or weight on file to calculate BMI. No results found. Patient is in no distress; well developed, nourished and groomed; neck is supple MASKED FACIES  CARDIOVASCULAR: Examination of carotid arteries is normal; no carotid bruits Regular rate and rhythm, no murmurs Examination of peripheral vascular system by observation and palpation is normal  EYES: Ophthalmoscopic exam of optic discs and posterior segments is normal; no papilledema or hemorrhages  MUSCULOSKELETAL: Gait, strength, tone, movements noted in Neurologic exam below  NEUROLOGIC: MENTAL STATUS:     05/12/2021    4:15 PM  MMSE - Mini Mental State Exam  Orientation to time 5  Orientation to Place 5  Registration 3  Attention/ Calculation 2  Recall 3  Language- name 2 objects 2  Language- repeat 1  Language- follow 3 step command 3  Language- read & follow direction 1  Write a sentence 1  Copy design 1  Total score 27   awake, alert, oriented to person, place and time recent and remote memory intact normal attention and concentration language fluent, comprehension intact, naming intact fund of knowledge appropriate  CRANIAL NERVE:  2nd - no papilledema on fundoscopic exam 2nd, 3rd, 4th, 6th - pupils equal and reactive to light, visual fields full to confrontation, extraocular muscles intact, no nystagmus 5th - facial sensation symmetric 7th - facial strength symmetric 8th - hearing intact 9th - palate elevates symmetrically, uvula midline 11th - shoulder shrug symmetric  12th - tongue protrusion midline  MOTOR:  normal bulk and tone, full strength in the BUE, BLE TRIGGER FINGER IN RIGHT HAND DIGIT 5  SENSORY:  normal and symmetric to light  touch  COORDINATION:  finger-nose-finger, fine finger movements normal  REFLEXES:  deep tendon reflexes TRACE and symmetric  GAIT/STATION:  narrow based gait    DIAGNOSTIC DATA (LABS, IMAGING, TESTING) - I reviewed patient records, labs, notes, testing and imaging myself where available.  Lab Results  Component Value Date   WBC 4.9 06/12/2021   HGB 10.4 (L) 06/12/2021   HCT 31.7 (L) 06/12/2021   MCV 92.2 06/12/2021   PLT 153 06/12/2021      Component Value Date/Time   NA 144 06/12/2021 1525   K 4.1 06/12/2021 1525   CL 116 (H) 06/12/2021 1525   CO2 22 06/12/2021 1525   GLUCOSE 90 06/12/2021 1525   BUN 14 06/12/2021 1525   CREATININE 0.45 06/12/2021 1525   CALCIUM 7.4 (L) 06/12/2021 1525   PROT 5.4 (L) 06/12/2021 1525   ALBUMIN 3.2 (L) 06/12/2021 1525   AST 28 06/12/2021 1525   ALT 9 06/12/2021 1525   ALKPHOS 33 (L) 06/12/2021 1525   BILITOT 0.7 06/12/2021 1525   GFRNONAA >60 06/12/2021 1525   GFRAA >60 07/21/2019 0932   No results found for: "CHOL", "HDL", "LDLCALC", "LDLDIRECT", "TRIG", "CHOLHDL" No results found for: "HGBA1C" No results found for: "VITAMINB12" No results found for: "TSH"  06/03/14 CT head [I reviewed images myself and agree with interpretation. -VRP]  - normal   05/24/13 MRI brain [I reviewed images myself and agree with interpretation. -VRP]  1.  No acute intracranial abnormality. 2. Volume loss and signal changes in the left mesial temporal lobe compatible with mesial temporal sclerosis. 3. Otherwise negative MRI appearance of the brain.    ASSESSMENT AND PLAN  72 y.o. year old female here with history of seizure disorder since age 85-16 years old, diagnosed at [facility] in June 2015 with frontal lobe epilepsy through VEEG. Last seizure on June 2015 (during [facility] video EEG). Also with atypical event on 12/31/15 (syncope vs seizure). Also with some intermixed non-epileptic spells. Also with stress / anxiety issues.   New  spells in June 2019, not clearly epileptic seizures.    Dx:  Partial symptomatic epilepsy with complex partial seizures, intractable, without status epilepticus ([place]) - Plan: lacosamide (VIMPAT) 200 MG TABS tablet, topiramate (TOPAMAX) 100 MG tablet    PLAN:   SEIZURE DISORDER (gutteral sounds, slow response, frontal lobe localization) + non-epileptic spells (oral dyskinetic activity) - continue vimpat '200mg'$  twice a day  - continue topiramate; reduce slightly from 125 to '100mg'$  twice a day; may continue to reduce over time due to memory complaints  RESTLESS LEG SYNDROME - continue ropinirole ('1mg'$  in AM, '2mg'$  in PM) for RLS  ANXIETY - follow up with psychiatry / psychology for stress/anxiety issues - no more diazepam refills from here (only per psychiatry)  Meds ordered this encounter  Medications   lacosamide (VIMPAT) 200 MG TABS tablet    Sig: Take 1 tablet (200 mg total) by mouth 2 (two) times daily.    Dispense:  60 tablet    Refill:  5   topiramate (TOPAMAX) 100 MG tablet    Sig: Take 1 tablet (100 mg total) by mouth 2 (two) times daily.    Dispense:  180 tablet    Refill:  4   Return in about 6 months (around 11/10/2022) for MyChart visit (  15 min).  Virtual Visit via Video Note  I connected with Adah Salvage on 05/12/22 at  2:45 PM EST by a video enabled telemedicine application and verified that I am speaking with the correct person using two identifiers.   I discussed the limitations of evaluation and management by telemedicine and the availability of in person appointments. The patient expressed understanding and agreed to proceed.  Patient is at home and I am at the office.   I spent 15 minutes of face-to-face and non-face-to-face time with patient.  This included previsit chart review, lab review, study review, order entry, electronic health record documentation, patient education.      Penni Bombard, MD 07/27/6061, 0:16 PM Certified in Neurology,  Neurophysiology and Neuroimaging  [facility] [address] [place], [place] [address] [phone]

## 2022-05-15 ENCOUNTER — Other Ambulatory Visit: Payer: Self-pay

## 2022-05-15 ENCOUNTER — Encounter (HOSPITAL_COMMUNITY): Payer: Self-pay

## 2022-05-15 ENCOUNTER — Emergency Department (HOSPITAL_COMMUNITY): Payer: 59

## 2022-05-15 ENCOUNTER — Emergency Department (HOSPITAL_COMMUNITY)
Admission: EM | Admit: 2022-05-15 | Discharge: 2022-05-16 | Disposition: A | Discharge status: Home / Self Care | Payer: 59 | Attending: Emergency Medicine | Admitting: Emergency Medicine

## 2022-05-15 DIAGNOSIS — D649 Anemia, unspecified: Secondary | ICD-10-CM | POA: Diagnosis not present

## 2022-05-15 DIAGNOSIS — Z91010 Allergy to peanuts: Secondary | ICD-10-CM | POA: Insufficient documentation

## 2022-05-15 DIAGNOSIS — R1031 Right lower quadrant pain: Secondary | ICD-10-CM | POA: Diagnosis not present

## 2022-05-15 DIAGNOSIS — I10 Essential (primary) hypertension: Secondary | ICD-10-CM | POA: Diagnosis not present

## 2022-05-15 DIAGNOSIS — Z79899 Other long term (current) drug therapy: Secondary | ICD-10-CM | POA: Insufficient documentation

## 2022-05-15 DIAGNOSIS — R200 Anesthesia of skin: Secondary | ICD-10-CM | POA: Insufficient documentation

## 2022-05-15 DIAGNOSIS — E876 Hypokalemia: Secondary | ICD-10-CM | POA: Diagnosis not present

## 2022-05-15 LAB — I-STAT CHEM 8, ED
BUN: 22 mg/dL (ref 8–23)
Calcium, Ion: 1.27 mmol/L (ref 1.15–1.40)
Chloride: 106 mmol/L (ref 98–111)
Creatinine, Ser: 0.8 mg/dL (ref 0.44–1.00)
Glucose, Bld: 132 mg/dL — ABNORMAL HIGH (ref 70–99)
HCT: 31 % — ABNORMAL LOW (ref 36.0–46.0)
Hemoglobin: 10.5 g/dL — ABNORMAL LOW (ref 12.0–15.0)
Potassium: 3.2 mmol/L — ABNORMAL LOW (ref 3.5–5.1)
Sodium: 143 mmol/L (ref 135–145)
TCO2: 24 mmol/L (ref 22–32)

## 2022-05-15 NOTE — ED Triage Notes (Signed)
Pt BIB [facility] from home w c/o of R sided facial numbness. LKW was 1630. Pt also states she was having trouble moving her R leg. Pt states she had a bridge done on her mouth on Monday. Pt has slurred speech at baseline. Pt does not take any blood thinners. Pt has a hx of seizures and HTN and takes medication for it.  EMS VS BP 164/88 P 60 O2 99% RA CBG 138

## 2022-05-15 NOTE — ED Provider Notes (Incomplete)
[facility] Provider Note  CSN: [id number] Arrival date & time: 05/15/22 2209  Chief Complaint(s) Numbness  HPI Teresa Nguyen is a 72 y.o. female {Add pertinent medical, surgical, social history, OB history to HPI:1}   The history is provided by the patient.    Past Medical History Past Medical History:  Diagnosis Date  . Anxiety   . Arthritis   . Arthritis of neck   . DVT (deep venous thrombosis) ([place]) 2008   L leg  . Finger fracture, right    small  . Major depressive disorder    pt denies  . Migraine   . Seizure disorder [facility]    since age 18-3, last sz 12/31/15  . Thyroid disease   . Wears partial dentures    Patient Active Problem List   Diagnosis Date Noted  . Abnormal loss of weight 10/17/2018  . GERD (gastroesophageal reflux disease) 10/17/2018  . Change in stool 10/17/2018  . Seizure ([place]) 12/27/2017  . 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 Complex partial epilepsy ([place]) 12/29/2013  . Localization-related (focal) (partial) epilepsy and epileptic syndromes with complex partial seizures, without mention of intractable epilepsy 05/09/2013  . Migraine 02/21/2013  . Right lower quadrant pain 02/21/2013  . Hypertension 02/21/2013  . Stress at home 02/21/2013  . Restless leg syndrome 12/28/2012  . 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 Authorizing Provider  acetaminophen (TYLENOL) 500 MG tablet Take 500 mg by mouth 2 (two) times daily as needed for moderate pain or headache.    [provider]  amLODipine (NORVASC) 5 MG tablet Take 1 tablet (5 mg total) by mouth daily. 12/29/17   Dana Allan I, MD  atorvastatin (LIPITOR) 10 MG tablet Take 10 mg by mouth at bedtime.  05/04/13   [provider]  Calcium Carbonate-Vitamin D (CALCIUM-D PO) Take 2 tablets by mouth daily.     [provider]  Carboxymethylcellulose Sodium (REFRESH LIQUIGEL OP) Place 1 drop into both eyes daily as needed (dry eyes).    [provider]  cycloSPORINE (RESTASIS) 0.05 % ophthalmic emulsion 1 drop 2 (two) times daily. 04/20/21   [provider]  diazepam (VALIUM) 10 MG tablet Take 10 mg by mouth daily as needed (seizure).    [provider]  diclofenac sodium (VOLTAREN) 1 % GEL Apply 1 application topically at bedtime. 02/07/18   [provider]  fluticasone (FLONASE) 50 MCG/ACT nasal spray Place 2 sprays into both nostrils at bedtime as needed for allergies.     [provider]  lacosamide (VIMPAT) 200 MG TABS tablet Take 1 tablet (200 mg total) by mouth 2 (two) times daily. 05/12/22   Penumalli, Earlean Polka, MD  levothyroxine (SYNTHROID, LEVOTHROID) 25 MCG tablet Take 25 mcg by mouth daily before breakfast.  05/04/13   [provider]  lidocaine-prilocaine (EMLA) cream Apply 1 Application topically as needed. 05/06/22   McDonald, Stephan Minister, DPM  loperamide (IMODIUM A-D) 2 MG tablet Take 1 mg by mouth 4 (four) times daily as needed for diarrhea or loose stools.    [provider]  LORazepam (ATIVAN) 1 MG tablet Take 1 tablet (1 mg total) by mouth 2 (two) times daily as needed for anxiety. 06/12/21   Charlesetta Shanks, MD  meloxicam (MOBIC) 7.5 MG tablet  07/28/19   [provider]  methocarbamol (ROBAXIN) 750 MG tablet Take 1  tablet (750 mg total) by mouth 2 (two) times daily as needed for muscle spasms. Patient taking differently: Take 750 mg by mouth at bedtime. 12/28/17   Bonnell Public, MD  Multiple Vitamins-Minerals (MULTIVITAMIN ADULT) TABS Take 1 tablet by mouth daily.    [provider]  Omega-3 Fatty Acids (FISH OIL) 1000 MG CAPS Take 1,000 mg by mouth in the morning and at bedtime.    [provider]  polyethylene glycol (MIRALAX / GLYCOLAX) packet Take 4.25 g by mouth daily.     [provider]  rOPINIRole  (REQUIP) 1 MG tablet Take 1 tablet (1 mg total) by mouth daily. 12/29/17   Bonnell Public, MD  sodium chloride (OCEAN) 0.65 % SOLN nasal spray Place 1 spray into both nostrils as needed for congestion.    [provider]  topiramate (TOPAMAX) 100 MG tablet Take 1 tablet (100 mg total) by mouth 2 (two) times daily. 05/12/22   Penumalli, Earlean Polka, MD  vitamin E 400 UNIT capsule Take 400 Units by mouth at bedtime.    [provider]                                                                                                                                    Allergies Peanut oil, Nickel, Other, and Sulfa antibiotics  Review of Systems Review of Systems As noted in HPI  Physical Exam Vital Signs  I have reviewed the triage vital signs BP 126/71 (BP Location: Right Arm)   Pulse 66   Temp 97.8 F (36.6 C) (Oral)   Resp 15   Ht '5\' 4"'$  (1.626 m)   Wt 68 kg   SpO2 97%   BMI 25.75 kg/m   Physical Exam Vitals reviewed.  Constitutional:      General: She is not in acute distress.    Appearance: She is well-developed. She is not diaphoretic.  HENT:     Head: Normocephalic and atraumatic.     Nose: Nose normal.  Eyes:     General: No scleral icterus.       Right eye: No discharge.        Left eye: No discharge.     Conjunctiva/sclera: Conjunctivae normal.     Pupils: Pupils are equal, round, and reactive to light.  Cardiovascular:     Rate and Rhythm: Normal rate and regular rhythm.     Heart sounds: No murmur heard.    No friction rub. No gallop.  Pulmonary:     Effort: Pulmonary effort is normal. No respiratory distress.     Breath sounds: Normal breath sounds. No stridor. No rales.  Abdominal:     General: There is no distension.     Palpations: Abdomen is soft.     Tenderness: There is no abdominal tenderness.  Musculoskeletal:        General: No tenderness.     Cervical back: Normal range  of motion and neck supple.  Skin:    General: Skin is warm  and dry.     Findings: No erythema or rash.  Neurological:     Mental Status: She is alert and oriented to person, place, and time.     Comments: Mental Status:  Alert and oriented to person, place, and time.  Attention and concentration normal.  Speech dysarthric  Recent memory is intact  Cranial Nerves:  II Visual Fields: Intact to confrontation. Visual fields intact. III, IV, VI: Pupils equal and reactive to light and near. Full eye movement without nystagmus  V Facial Sensation: Normal. No weakness of masticatory muscles  VII: No facial weakness or asymmetry  VIII Auditory Acuity: Grossly normal  IX/X: The uvula is midline; the palate elevates symmetrically  XI: Normal sternocleidomastoid and trapezius strength  XII: The tongue is midline. No atrophy or fasciculations.   Motor System: Muscle Strength: 5/5 and symmetric in the upper and lower extremities.  Muscle Tone: Tone and muscle bulk are normal in the upper and lower extremities.  Reflexes:  No Clonus Coordination: Intact finger-to-nose. No tremor.  Sensation: Intact to light touch. Gait: deferred      ED Results and Treatments Labs (all labs ordered are listed, but only abnormal results are displayed) Labs Reviewed - No data to display                                                                                                                       EKG  EKG Interpretation  Date/Time:    Ventricular Rate:    PR Interval:    QRS Duration:   QT Interval:    QTC Calculation:   R Axis:     Text Interpretation:         Radiology No results found.  Medications Ordered in ED Medications - No data to display                                                                                                                                   Procedures Procedures  (including critical care time)  Medical Decision Making / ED Course   Medical Decision Making Amount and/or Complexity of Data Reviewed Labs:  ordered. Radiology: ordered.          Final Clinical Impression(s) / ED Diagnoses Final diagnoses:  None    {Document critical care time when appropriate:1}  {Document review  of labs and clinical decision tools ie heart score, Chads2Vasc2 etc:1}  {Document your independent review of radiology images, and any outside records:1} {Document your discussion with family members, caretakers, and with consultants:1} {Document social determinants of health affecting pt's care:1} {Document your decision making why or why not admission, treatments were needed:1} This chart was dictated using voice recognition software.  Despite best efforts to proofread,  errors can occur which can change the documentation meaning.

## 2022-05-15 NOTE — ED Provider Notes (Signed)
[facility] Provider Note  CSN: [id number] Arrival date & time: 05/15/22 2209  Chief Complaint(s) Numbness  HPI Teresa Nguyen is a 72 y.o. female {Add pertinent medical, surgical, social history, OB history to HPI:1}   HPI  Past Medical History Past Medical History:  Diagnosis Date   Anxiety    Arthritis    Arthritis of neck    DVT (deep venous thrombosis) ([place]) 2008   L leg   Finger fracture, right    small   Major depressive disorder    pt denies   Migraine    Seizure disorder ([place])    since age 80-3, last sz 12/31/15   Thyroid disease    Wears partial dentures    Patient Active Problem List   Diagnosis Date Noted   Abnormal loss of weight 10/17/2018   GERD (gastroesophageal reflux disease) 10/17/2018   Change in stool 10/17/2018   Seizure ([place]) 12/27/2017   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 Authorizing Provider  acetaminophen (TYLENOL) 500 MG tablet Take 500 mg by mouth 2 (two) times daily as needed for moderate pain or headache.    [provider]  amLODipine (NORVASC) 5 MG tablet Take 1 tablet (5 mg total) by mouth daily. 12/29/17   Dana Allan I, MD  atorvastatin (LIPITOR) 10 MG tablet Take 10 mg by mouth at bedtime.  05/04/13   [provider]  Calcium Carbonate-Vitamin D (CALCIUM-D PO) Take 2 tablets by mouth daily.     [provider]  Carboxymethylcellulose Sodium (REFRESH LIQUIGEL OP) Place  1 drop into both eyes daily as needed (dry eyes).    [provider]  cycloSPORINE (RESTASIS) 0.05 % ophthalmic emulsion 1 drop 2 (two) times daily. 04/20/21   [provider]  diazepam (VALIUM) 10 MG tablet Take 10 mg by mouth daily as needed (seizure).    [provider]  diclofenac sodium (VOLTAREN) 1 % GEL Apply 1 application topically at bedtime. 02/07/18   [provider]  fluticasone (FLONASE) 50 MCG/ACT nasal spray Place 2 sprays into both nostrils at bedtime as needed for allergies.     [provider]  lacosamide (VIMPAT) 200 MG TABS tablet Take 1 tablet (200 mg total) by mouth 2 (two) times daily. 05/12/22   Penumalli, Earlean Polka, MD  levothyroxine (SYNTHROID, LEVOTHROID) 25 MCG tablet Take 25 mcg by mouth daily before breakfast.  05/04/13   [provider]  lidocaine-prilocaine (EMLA) cream Apply 1 Application topically as needed. 05/06/22   McDonald, Stephan Minister, DPM  loperamide (IMODIUM A-D) 2 MG tablet Take 1 mg by mouth 4 (four) times daily as needed for diarrhea or loose stools.    [provider]  LORazepam (ATIVAN) 1 MG tablet Take 1 tablet (1 mg total) by mouth 2 (two) times daily as needed for anxiety. 06/12/21   Charlesetta Shanks, MD  meloxicam (MOBIC) 7.5 MG tablet  07/28/19   [provider]  methocarbamol (ROBAXIN) 750 MG tablet Take 1 tablet (750 mg total) by mouth 2 (two)  times daily as needed for muscle spasms. Patient taking differently: Take 750 mg by mouth at bedtime. 12/28/17   Bonnell Public, MD  Multiple Vitamins-Minerals (MULTIVITAMIN ADULT) TABS Take 1 tablet by mouth daily.    [provider]  Omega-3 Fatty Acids (FISH OIL) 1000 MG CAPS Take 1,000 mg by mouth in the morning and at bedtime.    [provider]  polyethylene glycol (MIRALAX / GLYCOLAX) packet Take 4.25 g by mouth daily.     [provider]  rOPINIRole (REQUIP) 1 MG tablet Take 1 tablet (1 mg total) by mouth daily.  12/29/17   Bonnell Public, MD  sodium chloride (OCEAN) 0.65 % SOLN nasal spray Place 1 spray into both nostrils as needed for congestion.    [provider]  topiramate (TOPAMAX) 100 MG tablet Take 1 tablet (100 mg total) by mouth 2 (two) times daily. 05/12/22   Penumalli, Earlean Polka, MD  vitamin E 400 UNIT capsule Take 400 Units by mouth at bedtime.    [provider]                                                                                                                                    Allergies Peanut oil, Nickel, Other, and Sulfa antibiotics  Review of Systems Review of Systems As noted in HPI  Physical Exam Vital Signs  I have reviewed the triage vital signs BP 126/71 (BP Location: Right Arm)   Pulse 66   Temp 97.8 F (36.6 C) (Oral)   Resp 15   Ht '5\' 4"'$  (1.626 m)   Wt 68 kg   SpO2 97%   BMI 25.75 kg/m  *** Physical Exam  ED Results and Treatments Labs (all labs ordered are listed, but only abnormal results are displayed) Labs Reviewed - No data to display                                                                                                                       EKG  EKG Interpretation  Date/Time:    Ventricular Rate:    PR Interval:    QRS Duration:   QT Interval:    QTC Calculation:   R Axis:     Text Interpretation:         Radiology No results found.  Medications Ordered in ED Medications - No data to display  Procedures Procedures  (including critical care time)  Medical Decision Making / ED Course   Medical Decision Making         Final Clinical Impression(s) / ED Diagnoses Final diagnoses:  None    {Document critical care time when appropriate:1}  {Document review of labs and clinical decision tools ie heart score, Chads2Vasc2 etc:1}  {Document your independent  review of radiology images, and any outside records:1} {Document your discussion with family members, caretakers, and with consultants:1} {Document social determinants of health affecting pt's care:1} {Document your decision making why or why not admission, treatments were needed:1} This chart was dictated using voice recognition software.  Despite best efforts to proofread,  errors can occur which can change the documentation meaning.

## 2022-05-16 ENCOUNTER — Emergency Department (HOSPITAL_COMMUNITY): Payer: 59

## 2022-05-16 DIAGNOSIS — R200 Anesthesia of skin: Secondary | ICD-10-CM | POA: Diagnosis not present

## 2022-05-16 LAB — CBC
HCT: 31.9 % — ABNORMAL LOW (ref 36.0–46.0)
Hemoglobin: 10.7 g/dL — ABNORMAL LOW (ref 12.0–15.0)
MCH: 30.4 pg (ref 26.0–34.0)
MCHC: 33.5 g/dL (ref 30.0–36.0)
MCV: 90.6 fL (ref 80.0–100.0)
Platelets: 178 10*3/uL (ref 150–400)
RBC: 3.52 MIL/uL — ABNORMAL LOW (ref 3.87–5.11)
RDW: 12.2 % (ref 11.5–15.5)
WBC: 4.3 10*3/uL (ref 4.0–10.5)
nRBC: 0 % (ref 0.0–0.2)

## 2022-05-16 LAB — COMPREHENSIVE METABOLIC PANEL
ALT: 15 U/L (ref 0–44)
AST: 23 U/L (ref 15–41)
Albumin: 3.7 g/dL (ref 3.5–5.0)
Alkaline Phosphatase: 46 U/L (ref 38–126)
Anion gap: 9 (ref 5–15)
BUN: 22 mg/dL (ref 8–23)
CO2: 26 mmol/L (ref 22–32)
Calcium: 9.6 mg/dL (ref 8.9–10.3)
Chloride: 106 mmol/L (ref 98–111)
Creatinine, Ser: 0.85 mg/dL (ref 0.44–1.00)
GFR, Estimated: >60 mL/min (ref >60)
Glucose, Bld: 134 mg/dL — ABNORMAL HIGH (ref 70–99)
Potassium: 3.1 mmol/L — ABNORMAL LOW (ref 3.5–5.1)
Sodium: 141 mmol/L (ref 135–145)
Total Bilirubin: 0.3 mg/dL (ref 0.3–1.2)
Total Protein: 5.9 g/dL — ABNORMAL LOW (ref 6.5–8.1)

## 2022-05-16 LAB — DIFFERENTIAL
Abs Immature Granulocytes: 0.01 10*3/uL (ref 0.00–0.07)
Basophils Absolute: 0.1 10*3/uL (ref 0.0–0.1)
Basophils Relative: 1 %
Eosinophils Absolute: 0.1 10*3/uL (ref 0.0–0.5)
Eosinophils Relative: 2 %
Immature Granulocytes: 0 %
Lymphocytes Relative: 35 %
Lymphs Abs: 1.5 10*3/uL (ref 0.7–4.0)
Monocytes Absolute: 0.5 10*3/uL (ref 0.1–1.0)
Monocytes Relative: 12 %
Neutro Abs: 2.1 10*3/uL (ref 1.7–7.7)
Neutrophils Relative %: 50 %

## 2022-05-16 LAB — ETHANOL: Alcohol, Ethyl (B): <10 mg/dL (ref <10)

## 2022-05-16 LAB — PROTIME-INR
INR: 1 (ref 0.8–1.2)
Prothrombin Time: 13.5 seconds (ref 11.4–15.2)

## 2022-05-16 LAB — APTT: aPTT: 26 seconds (ref 24–36)

## 2022-05-16 MED ORDER — POTASSIUM CHLORIDE CRYS ER 20 MEQ PO TBCR: 20.0000 meq | EXTENDED_RELEASE_TABLET | Freq: Every day | ORAL | 0 refills | Status: AC

## 2022-05-16 MED ORDER — POTASSIUM CHLORIDE CRYS ER 20 MEQ PO TBCR
40.0000 meq | EXTENDED_RELEASE_TABLET | Freq: Once | ORAL | Status: AC
  Administered 2022-05-16: 40 meq via ORAL
  Filled 2022-05-16: qty 2

## 2022-05-16 MED ORDER — POTASSIUM CHLORIDE IN NACL 20-0.9 MEQ/L-% IV SOLN
Freq: Once | INTRAVENOUS | Status: AC
  Filled 2022-05-16: qty 1000

## 2022-05-16 MED ORDER — ROPINIROLE HCL 1 MG PO TABS
1.0000 mg | ORAL_TABLET | Freq: Once | ORAL | Status: AC
  Administered 2022-05-16: 1 mg via ORAL
  Filled 2022-05-16: qty 1

## 2022-05-16 NOTE — ED Notes (Signed)
Patient transported to CT 

## 2022-05-16 NOTE — ED Notes (Signed)
Pt in CT.

## 2022-05-16 NOTE — Discharge Instructions (Addendum)
Your workup in the emergency department revealed that your potassium was low.  This is likely due to your new high blood pressure medication called hydrochlorothiazide.  This may have caused your right facial numbness.  I will prescribe you prescription for potassium supplement.  Please follow-up closely with your primary care provider to determine whether you need to have a longer course of potassium supplement.  In addition please follow-up with your neurologist.  They can continue the workup in clinic to determine your risk for strokes.

## 2022-05-16 NOTE — ED Notes (Signed)
Pt in MRI.

## 2022-05-19 ENCOUNTER — Encounter: Payer: Self-pay | Admitting: Diagnostic Neuroimaging

## 2022-05-25 ENCOUNTER — Other Ambulatory Visit: Payer: Self-pay | Admitting: Diagnostic Neuroimaging

## 2022-06-15 ENCOUNTER — Other Ambulatory Visit: Payer: Self-pay | Admitting: Internal Medicine

## 2022-06-15 DIAGNOSIS — Z1239 Encounter for other screening for malignant neoplasm of breast: Secondary | ICD-10-CM

## 2022-06-16 ENCOUNTER — Other Ambulatory Visit: Payer: Self-pay | Admitting: Internal Medicine

## 2022-06-16 DIAGNOSIS — M810 Age-related osteoporosis without current pathological fracture: Secondary | ICD-10-CM

## 2022-10-06 ENCOUNTER — Ambulatory Visit: Payer: 59

## 2022-10-11 ENCOUNTER — Other Ambulatory Visit: Payer: Self-pay | Admitting: Diagnostic Neuroimaging

## 2022-10-11 DIAGNOSIS — G40219 Localization-related (focal) (partial) symptomatic epilepsy and epileptic syndromes with complex partial seizures, intractable, without status epilepticus: Secondary | ICD-10-CM

## 2022-10-13 ENCOUNTER — Ambulatory Visit: Payer: 59

## 2022-10-28 ENCOUNTER — Telehealth (INDEPENDENT_AMBULATORY_CARE_PROVIDER_SITE_OTHER): Payer: 59 | Admitting: Podiatrist

## 2022-10-28 NOTE — Telephone Encounter (Signed)
Teresa Nguyen called the nurse line and left a message stating she had a small bump develop on her left hand and on her left foot after using the voltaren gel which was recommended at her last visit.    I called Teresa Nguyen and spoke with her directly-  I recommended she discontinue the voltaren gel for at least 2 weeks to see if the lesions subside.  She described the area on her foot as non painful and non raised but slightly discolored.  She was instructed to call if she has any pain develop in her foot, or if the area grows in size or becomes red or inflamed.  She demonstrated an understanding of these recommendations.

## 2022-12-01 ENCOUNTER — Other Ambulatory Visit: Payer: Self-pay

## 2022-12-01 ENCOUNTER — Emergency Department (HOSPITAL_BASED_OUTPATIENT_CLINIC_OR_DEPARTMENT_OTHER): Payer: 59

## 2022-12-01 ENCOUNTER — Emergency Department (HOSPITAL_BASED_OUTPATIENT_CLINIC_OR_DEPARTMENT_OTHER)
Admission: EM | Admit: 2022-12-01 | Discharge: 2022-12-01 | Disposition: A | Discharge status: Home / Self Care | Payer: 59 | Attending: Emergency Medicine | Admitting: Emergency Medicine

## 2022-12-01 ENCOUNTER — Encounter (HOSPITAL_BASED_OUTPATIENT_CLINIC_OR_DEPARTMENT_OTHER): Payer: Self-pay | Admitting: Emergency Medicine

## 2022-12-01 DIAGNOSIS — Z79899 Other long term (current) drug therapy: Secondary | ICD-10-CM | POA: Diagnosis not present

## 2022-12-01 DIAGNOSIS — N200 Calculus of kidney: Secondary | ICD-10-CM

## 2022-12-01 DIAGNOSIS — R1032 Left lower quadrant pain: Secondary | ICD-10-CM | POA: Diagnosis present

## 2022-12-01 DIAGNOSIS — R748 Abnormal levels of other serum enzymes: Secondary | ICD-10-CM | POA: Diagnosis not present

## 2022-12-01 DIAGNOSIS — I10 Essential (primary) hypertension: Secondary | ICD-10-CM | POA: Insufficient documentation

## 2022-12-01 DIAGNOSIS — Z91010 Allergy to peanuts: Secondary | ICD-10-CM | POA: Diagnosis not present

## 2022-12-01 DIAGNOSIS — E039 Hypothyroidism, unspecified: Secondary | ICD-10-CM | POA: Insufficient documentation

## 2022-12-01 LAB — COMPREHENSIVE METABOLIC PANEL
ALT: 10 U/L (ref 0–44)
AST: 22 U/L (ref 15–41)
Albumin: 4.7 g/dL (ref 3.5–5.0)
Alkaline Phosphatase: 40 U/L (ref 38–126)
Anion gap: 11 (ref 5–15)
BUN: 26 mg/dL — ABNORMAL HIGH (ref 8–23)
CO2: 22 mmol/L (ref 22–32)
Calcium: 10.1 mg/dL (ref 8.9–10.3)
Chloride: 110 mmol/L (ref 98–111)
Creatinine, Ser: 0.85 mg/dL (ref 0.44–1.00)
GFR, Estimated: >60 mL/min (ref >60)
Glucose, Bld: 93 mg/dL (ref 70–99)
Potassium: 3.9 mmol/L (ref 3.5–5.1)
Sodium: 143 mmol/L (ref 135–145)
Total Bilirubin: 0.2 mg/dL — ABNORMAL LOW (ref 0.3–1.2)
Total Protein: 7.2 g/dL (ref 6.5–8.1)

## 2022-12-01 LAB — URINALYSIS, ROUTINE W REFLEX MICROSCOPIC
Bacteria, UA: NONE SEEN
Bilirubin Urine: NEGATIVE
Glucose, UA: NEGATIVE mg/dL
Ketones, ur: NEGATIVE mg/dL
Nitrite: NEGATIVE
Protein, ur: NEGATIVE mg/dL
Specific Gravity, Urine: 1.016 (ref 1.005–1.030)
pH: 6.5 (ref 5.0–8.0)

## 2022-12-01 LAB — CBC WITH DIFFERENTIAL/PLATELET
Abs Immature Granulocytes: 0.01 10*3/uL (ref 0.00–0.07)
Basophils Absolute: 0.1 10*3/uL (ref 0.0–0.1)
Basophils Relative: 1 %
Eosinophils Absolute: 0.2 10*3/uL (ref 0.0–0.5)
Eosinophils Relative: 3 %
HCT: 37.1 % (ref 36.0–46.0)
Hemoglobin: 12.2 g/dL (ref 12.0–15.0)
Immature Granulocytes: 0 %
Lymphocytes Relative: 42 %
Lymphs Abs: 2.3 10*3/uL (ref 0.7–4.0)
MCH: 30.6 pg (ref 26.0–34.0)
MCHC: 32.9 g/dL (ref 30.0–36.0)
MCV: 93 fL (ref 80.0–100.0)
Monocytes Absolute: 0.6 10*3/uL (ref 0.1–1.0)
Monocytes Relative: 11 %
Neutro Abs: 2.3 10*3/uL (ref 1.7–7.7)
Neutrophils Relative %: 43 %
Platelets: 155 10*3/uL (ref 150–400)
RBC: 3.99 MIL/uL (ref 3.87–5.11)
RDW: 12.5 % (ref 11.5–15.5)
WBC: 5.4 10*3/uL (ref 4.0–10.5)
nRBC: 0 % (ref 0.0–0.2)

## 2022-12-01 LAB — LIPASE, BLOOD: Lipase: 82 U/L — ABNORMAL HIGH (ref 11–51)

## 2022-12-01 MED ORDER — ONDANSETRON HCL 4 MG/2ML IJ SOLN
4.0000 mg | Freq: Once | INTRAMUSCULAR | Status: AC
  Administered 2022-12-01: 4 mg via INTRAVENOUS
  Filled 2022-12-01: qty 2

## 2022-12-01 MED ORDER — HYDROCODONE-ACETAMINOPHEN 5-325 MG PO TABS: 1.0000 | ORAL_TABLET | Freq: Four times a day (QID) | ORAL | 0 refills | Status: DC | PRN

## 2022-12-01 MED ORDER — KETOROLAC TROMETHAMINE 30 MG/ML IJ SOLN
30.0000 mg | Freq: Once | INTRAMUSCULAR | Status: AC
  Administered 2022-12-01: 30 mg via INTRAVENOUS
  Filled 2022-12-01: qty 1

## 2022-12-01 MED ORDER — MORPHINE SULFATE (PF) 4 MG/ML IV SOLN
4.0000 mg | Freq: Once | INTRAVENOUS | Status: AC
  Administered 2022-12-01: 4 mg via INTRAVENOUS
  Filled 2022-12-01: qty 1

## 2022-12-01 NOTE — Discharge Instructions (Addendum)
Begin taking hydrocodone as prescribed as needed for pain.  Follow-up with urology if your symptoms have not improved in the next few days.  The contact information for [facility] has been provided in this discharge summary for you to call and make these arrangements.

## 2022-12-01 NOTE — ED Provider Notes (Signed)
[place] EMERGENCY DEPARTMENT AT [facility] Provider Note   CSN: [id number] Arrival date & time: 12/01/22  0405     History  Chief Complaint  Patient presents with   Abdominal Pain    Teresa Nguyen is a 72 y.o. female.  Patient is a 72 year old female with past medical history of hyperlipidemia, hypertension, hypothyroidism.  Patient presenting today with complaints of left-sided abdominal pain.  This woke her from sleep this evening.  She describes a sharp, stabbing pain to the left lower abdomen and flank.  She had a similar episode 2 days ago, but seem to resolve spontaneously.  She denies fevers or chills.  She denies bowel or bladder complaints.  No aggravating or alleviating factors.  The history is provided by the patient.       Home Medications Prior to Admission medications   Medication Sig Start Date End Date Taking? Authorizing Provider  acetaminophen (TYLENOL) 500 MG tablet Take 500 mg by mouth 2 (two) times daily as needed for moderate pain or headache.    [provider]  amLODipine (NORVASC) 5 MG tablet Take 1 tablet (5 mg total) by mouth daily. 12/29/17   Berton Mount I, MD  atorvastatin (LIPITOR) 10 MG tablet Take 10 mg by mouth at bedtime.  05/04/13   [provider]  Calcium Carbonate-Vitamin D (CALCIUM-D PO) Take 2 tablets by mouth daily.     [provider]  Carboxymethylcellulose Sodium (REFRESH LIQUIGEL OP) Place 1 drop into both eyes daily as needed (dry eyes).    [provider]  cycloSPORINE (RESTASIS) 0.05 % ophthalmic emulsion 1 drop 2 (two) times daily. 04/20/21   [provider]  diazepam (VALIUM) 10 MG tablet Take 10 mg by mouth daily as needed (seizure).    [provider]  diclofenac sodium (VOLTAREN) 1 % GEL Apply 1 application topically at bedtime. 02/07/18   [provider]  fluticasone (FLONASE) 50 MCG/ACT nasal spray Place 2 sprays into both nostrils at bedtime as  needed for allergies.     [provider]  lacosamide (VIMPAT) 200 MG TABS tablet Take 1 tablet by mouth twice daily 10/12/22   Penumalli, Glenford Bayley, MD  levothyroxine (SYNTHROID, LEVOTHROID) 25 MCG tablet Take 25 mcg by mouth daily before breakfast.  05/04/13   [provider]  lidocaine-prilocaine (EMLA) cream Apply 1 Application topically as needed. 05/06/22   McDonald, Rachelle Hora, DPM  loperamide (IMODIUM A-D) 2 MG tablet Take 1 mg by mouth 4 (four) times daily as needed for diarrhea or loose stools.    [provider]  LORazepam (ATIVAN) 1 MG tablet Take 1 tablet (1 mg total) by mouth 2 (two) times daily as needed for anxiety. 06/12/21   Arby Barrette, MD  meloxicam (MOBIC) 7.5 MG tablet  07/28/19   [provider]  methocarbamol (ROBAXIN) 750 MG tablet Take 1 tablet (750 mg total) by mouth 2 (two) times daily as needed for muscle spasms. Patient taking differently: Take 750 mg by mouth at bedtime. 12/28/17   Barnetta Chapel, MD  Multiple Vitamins-Minerals (MULTIVITAMIN ADULT) TABS Take 1 tablet by mouth daily.    [provider]  Omega-3 Fatty Acids (FISH OIL) 1000 MG CAPS Take 1,000 mg by mouth in the morning and at bedtime.    [provider]  polyethylene glycol (MIRALAX / GLYCOLAX) packet Take 4.25 g by mouth daily.     [provider]  potassium chloride SA (KLOR-CON M) 20 MEQ tablet Take 1  tablet (20 mEq total) by mouth daily for 14 days. 05/16/22 05/30/22  Nira Conn, MD  rOPINIRole (REQUIP) 1 MG tablet Take 1 tablet (1 mg total) by mouth daily. 12/29/17   Barnetta Chapel, MD  sodium chloride (OCEAN) 0.65 % SOLN nasal spray Place 1 spray into both nostrils as needed for congestion.    [provider]  topiramate (TOPAMAX) 100 MG tablet Take 1 tablet (100 mg total) by mouth 2 (two) times daily. 05/12/22   Penumalli, Glenford Bayley, MD  topiramate (TOPAMAX) 25 MG tablet Take 1 tablet by mouth twice daily 05/25/22    Penumalli, Glenford Bayley, MD  vitamin E 400 UNIT capsule Take 400 Units by mouth at bedtime.    [provider]      Allergies    Peanut oil, Nickel, Other, and Sulfa antibiotics    Review of Systems   Review of Systems  All other systems reviewed and are negative.   Physical Exam Updated Vital Signs Temp 97.9 F (36.6 C) (Oral)   Wt 65.8 kg   BMI 24.89 kg/m  Physical Exam Vitals and nursing note reviewed.  Constitutional:      General: She is not in acute distress.    Appearance: She is well-developed. She is not diaphoretic.  HENT:     Head: Normocephalic and atraumatic.  Cardiovascular:     Rate and Rhythm: Normal rate and regular rhythm.     Heart sounds: No murmur heard.    No friction rub. No gallop.  Pulmonary:     Effort: Pulmonary effort is normal. No respiratory distress.     Breath sounds: Normal breath sounds. No wheezing.  Abdominal:     General: Bowel sounds are normal. There is no distension.     Palpations: Abdomen is soft.     Tenderness: There is abdominal tenderness in the left lower quadrant. There is left CVA tenderness. There is no right CVA tenderness, guarding or rebound.  Musculoskeletal:        General: Normal range of motion.     Cervical back: Normal range of motion and neck supple.  Skin:    General: Skin is warm and dry.  Neurological:     General: No focal deficit present.     Mental Status: She is alert and oriented to person, place, and time.     ED Results / Procedures / Treatments   Labs (all labs ordered are listed, but only abnormal results are displayed) Labs Reviewed - No data to display  EKG None  Radiology No results found.  Procedures Procedures    Medications Ordered in ED Medications - No data to display  ED Course/ Medical Decision Making/ A&P  Patient is a 72 year old female presenting with left flank and abdominal pain starting in the night.  She had a similar episode 2 days ago.  Patient arrives  here with stable vital signs, is afebrile, but does appear somewhat uncomfortable.  There is some tenderness to the left lower quadrant and left flank.  Workup initiated including CBC, metabolic panel, lipase, and urinalysis.  There is a slight elevation of lipase, but laboratory studies otherwise unremarkable.  Urinalysis shows microscopic hematuria.  CT with renal protocol obtained showing a 2 x 3 mm distal left ureteral stone with obstructive uropathy.  Patient has been given Toradol and morphine for pain along with Zofran for nausea and seems to be feeling better.  Patient to be discharged with pain medication and follow-up with urology as  needed if the stone has not passed.  Final Clinical Impression(s) / ED Diagnoses Final diagnoses:  None    Rx / DC Orders ED Discharge Orders     None         Geoffery Lyons, MD 12/01/22 [phone]

## 2022-12-01 NOTE — ED Triage Notes (Signed)
Pt in via [facility] with sharp LLQ pain that began 2 days ago, but worsened tonight - denies n/v/d or urinary symptoms.

## 2022-12-02 ENCOUNTER — Telehealth (HOSPITAL_BASED_OUTPATIENT_CLINIC_OR_DEPARTMENT_OTHER): Payer: Self-pay | Admitting: Emergency Medicine

## 2022-12-02 MED ORDER — OXYCODONE-ACETAMINOPHEN 5-325 MG PO TABS
1.0000 | ORAL_TABLET | Freq: Four times a day (QID) | ORAL | 0 refills | Status: AC | PRN
Start: 2022-12-02 — End: ?

## 2022-12-02 NOTE — Telephone Encounter (Signed)
I was informed by ER staff that the patient's pharmacy called.  She was diagnosed with a kidney stone in the ER on 8/20 and was prescribed Norco.  The pharmacy called and stated that Norco is currently out of stock.  Percocet was prescribed for pain management in the setting of her nephrolithiasis.

## 2022-12-08 ENCOUNTER — Ambulatory Visit
Admission: RE | Admit: 2022-12-08 | Discharge: 2022-12-08 | Disposition: A | Discharge status: Home / Self Care | Payer: 59 | Source: Ambulatory Visit | Attending: Internal Medicine | Admitting: Internal Medicine

## 2022-12-08 DIAGNOSIS — M810 Age-related osteoporosis without current pathological fracture: Secondary | ICD-10-CM

## 2022-12-29 ENCOUNTER — Other Ambulatory Visit: Payer: Self-pay | Admitting: Obstetrics and Gynecology

## 2022-12-29 DIAGNOSIS — R2231 Localized swelling, mass and lump, right upper limb: Secondary | ICD-10-CM

## 2023-01-04 ENCOUNTER — Other Ambulatory Visit: Payer: 59

## 2023-01-12 ENCOUNTER — Ambulatory Visit
Admission: RE | Admit: 2023-01-12 | Discharge: 2023-01-12 | Disposition: A | Discharge status: Home / Self Care | Payer: 59 | Source: Ambulatory Visit | Attending: Obstetrics and Gynecology

## 2023-01-12 DIAGNOSIS — R2231 Localized swelling, mass and lump, right upper limb: Secondary | ICD-10-CM

## 2023-01-24 ENCOUNTER — Emergency Department (HOSPITAL_COMMUNITY)
Admission: EM | Admit: 2023-01-24 | Discharge: 2023-01-24 | Disposition: A | Discharge status: Home / Self Care | Payer: 59 | Attending: Emergency Medicine | Admitting: Emergency Medicine

## 2023-01-24 ENCOUNTER — Emergency Department (HOSPITAL_COMMUNITY): Payer: 59

## 2023-01-24 ENCOUNTER — Other Ambulatory Visit: Payer: Self-pay

## 2023-01-24 DIAGNOSIS — Z79899 Other long term (current) drug therapy: Secondary | ICD-10-CM | POA: Insufficient documentation

## 2023-01-24 DIAGNOSIS — Z91010 Allergy to peanuts: Secondary | ICD-10-CM | POA: Diagnosis not present

## 2023-01-24 DIAGNOSIS — R1084 Generalized abdominal pain: Secondary | ICD-10-CM | POA: Diagnosis not present

## 2023-01-24 DIAGNOSIS — I10 Essential (primary) hypertension: Secondary | ICD-10-CM | POA: Diagnosis not present

## 2023-01-24 DIAGNOSIS — Z79890 Hormone replacement therapy (postmenopausal): Secondary | ICD-10-CM | POA: Diagnosis not present

## 2023-01-24 DIAGNOSIS — E039 Hypothyroidism, unspecified: Secondary | ICD-10-CM | POA: Insufficient documentation

## 2023-01-24 DIAGNOSIS — R4701 Aphasia: Secondary | ICD-10-CM | POA: Diagnosis present

## 2023-01-24 LAB — URINALYSIS, W/ REFLEX TO CULTURE (INFECTION SUSPECTED)
Bilirubin Urine: NEGATIVE
Glucose, UA: NEGATIVE mg/dL
Ketones, ur: NEGATIVE mg/dL
Nitrite: NEGATIVE
Protein, ur: NEGATIVE mg/dL
RBC / HPF: >50 RBC/hpf (ref 0–5)
Specific Gravity, Urine: 1.004 — ABNORMAL LOW (ref 1.005–1.030)
pH: 7 (ref 5.0–8.0)

## 2023-01-24 LAB — COMPREHENSIVE METABOLIC PANEL
ALT: 15 U/L (ref 0–44)
AST: 22 U/L (ref 15–41)
Albumin: 3.9 g/dL (ref 3.5–5.0)
Alkaline Phosphatase: 42 U/L (ref 38–126)
Anion gap: 9 (ref 5–15)
BUN: 16 mg/dL (ref 8–23)
CO2: 24 mmol/L (ref 22–32)
Calcium: 9.3 mg/dL (ref 8.9–10.3)
Chloride: 109 mmol/L (ref 98–111)
Creatinine, Ser: 0.86 mg/dL (ref 0.44–1.00)
GFR, Estimated: >60 mL/min (ref >60)
Glucose, Bld: 115 mg/dL — ABNORMAL HIGH (ref 70–99)
Potassium: 3.4 mmol/L — ABNORMAL LOW (ref 3.5–5.1)
Sodium: 142 mmol/L (ref 135–145)
Total Bilirubin: 0.7 mg/dL (ref 0.3–1.2)
Total Protein: 6.2 g/dL — ABNORMAL LOW (ref 6.5–8.1)

## 2023-01-24 LAB — ETHANOL: Alcohol, Ethyl (B): <10 mg/dL (ref <10)

## 2023-01-24 LAB — CBC
HCT: 33.9 % — ABNORMAL LOW (ref 36.0–46.0)
Hemoglobin: 11 g/dL — ABNORMAL LOW (ref 12.0–15.0)
MCH: 30.3 pg (ref 26.0–34.0)
MCHC: 32.4 g/dL (ref 30.0–36.0)
MCV: 93.4 fL (ref 80.0–100.0)
Platelets: 204 10*3/uL (ref 150–400)
RBC: 3.63 MIL/uL — ABNORMAL LOW (ref 3.87–5.11)
RDW: 12.3 % (ref 11.5–15.5)
WBC: 4.2 10*3/uL (ref 4.0–10.5)
nRBC: 0 % (ref 0.0–0.2)

## 2023-01-24 LAB — PROTIME-INR
INR: 1 (ref 0.8–1.2)
Prothrombin Time: 13.2 s (ref 11.4–15.2)

## 2023-01-24 LAB — I-STAT CHEM 8, ED
BUN: 18 mg/dL (ref 8–23)
Calcium, Ion: 1.24 mmol/L (ref 1.15–1.40)
Chloride: 107 mmol/L (ref 98–111)
Creatinine, Ser: 0.8 mg/dL (ref 0.44–1.00)
Glucose, Bld: 113 mg/dL — ABNORMAL HIGH (ref 70–99)
HCT: 33 % — ABNORMAL LOW (ref 36.0–46.0)
Hemoglobin: 11.2 g/dL — ABNORMAL LOW (ref 12.0–15.0)
Potassium: 3.4 mmol/L — ABNORMAL LOW (ref 3.5–5.1)
Sodium: 144 mmol/L (ref 135–145)
TCO2: 23 mmol/L (ref 22–32)

## 2023-01-24 LAB — DIFFERENTIAL
Abs Immature Granulocytes: 0.01 10*3/uL (ref 0.00–0.07)
Basophils Absolute: 0 10*3/uL (ref 0.0–0.1)
Basophils Relative: 1 %
Eosinophils Absolute: 0.1 10*3/uL (ref 0.0–0.5)
Eosinophils Relative: 2 %
Immature Granulocytes: 0 %
Lymphocytes Relative: 32 %
Lymphs Abs: 1.4 10*3/uL (ref 0.7–4.0)
Monocytes Absolute: 0.5 10*3/uL (ref 0.1–1.0)
Monocytes Relative: 11 %
Neutro Abs: 2.3 10*3/uL (ref 1.7–7.7)
Neutrophils Relative %: 54 %

## 2023-01-24 LAB — APTT: aPTT: 26 s (ref 24–36)

## 2023-01-24 MED ORDER — SODIUM CHLORIDE 0.9 % IV SOLN
200.0000 mg | Freq: Once | INTRAVENOUS | Status: AC
  Administered 2023-01-24: 200 mg via INTRAVENOUS
  Filled 2023-01-24: qty 20

## 2023-01-24 MED ORDER — IOHEXOL 350 MG/ML SOLN
75.0000 mL | Freq: Once | INTRAVENOUS | Status: AC | PRN
  Administered 2023-01-24: 75 mL via INTRAVENOUS

## 2023-01-24 MED ORDER — SODIUM CHLORIDE 0.9 % IV BOLUS
1000.0000 mL | Freq: Once | INTRAVENOUS | Status: AC
  Administered 2023-01-24: 1000 mL via INTRAVENOUS

## 2023-01-24 NOTE — Discharge Instructions (Addendum)
Teresa Nguyen:  Thank you for allowing [place] to take care of you today.  We hope you begin feeling better soon. You were seen today for speech difficulties.  Your lab work and CT imaging overall looked very good without any acute pathology.  You are given additional Vimpat dosing while in the hospital  To-Do: Please follow-up with your primary doctor to schedule an appointment with a new primary care doctor within the next 2-3 days. Please follow-up with your neurologist to discuss medications and the symptoms you experienced today.  Continue taking all medications as prescribed.  Please return to the Emergency Department or call 911 if you experience chest pain, shortness of breath, severe pain, severe fever, altered mental status, or have any reason to think that you need emergency medical care.  Thank you again.  Hope you feel better soon.

## 2023-01-24 NOTE — ED Notes (Signed)
Pt ambulatory to bathroom at this time.

## 2023-01-24 NOTE — ED Triage Notes (Addendum)
Pt arrives via EMS from private residence.  EMS states pt fell asleep at 2200, feeling normal. Woke up 0700 noticed some expressive aphasia and slurred speech, and reports R facial weakness. Pt with hx DVT 15 years ago. Not on AC's for 10+ years. No recent falls/injuries. No hx complex migraines.  Pt hx seizures, and auras that can last hours-day prior. States this morning sensation felt similar to this feeling. Pt states normally she feels "pressure" in her head prior to seizures, and difficulty with speech max  Pt states mild L cheek stiffness is baseline.  Pt states she has had diarrhea this week.  Pt reports provider adjusted seizure meds a few months ago.  Pt with expressive aphasia.

## 2023-01-24 NOTE — ED Provider Notes (Signed)
[place] EMERGENCY DEPARTMENT AT [facility] Provider Note   CSN: [id number] Arrival date & time: 01/24/23  1421     History  Chief Complaint  Patient presents with   Aphasia    Teresa Nguyen is a 72 y.o. female.  HPI  72 year old female with past medical history of prior DVT, migraine headaches, seizure disorder, hypothyroidism, HTN, HLD presenting for evaluation of aphasia.  Patient states that she went to sleep at 10 PM last evening in her normal state of health.  She woke at 7 this morning with expressive aphasia.  She says that she has this aphasia type symptom complex prior to when she has a breakthrough seizure.  She thinks her last seizure was 5 years ago.  She says that sometimes this aura can last several days.  Patient notes that the only way for the aphasia to stop is for seizure to occur and her to go back to her neurologic baseline.  Patient denies any headache, vision changes, chest pain, shortness of breath, new numbness/tingling/weakness.  She does endorse some mild abdominal pain over the last several days, but no urinary frequency or dysuria.  No recent changes in medications.  No recent missed doses.  No known fevers or known sick contacts.     Home Medications Prior to Admission medications   Medication Sig Start Date End Date Taking? Authorizing Provider  acetaminophen (TYLENOL) 500 MG tablet Take 500 mg by mouth 2 (two) times daily as needed for moderate pain or headache.    [provider]  amLODipine (NORVASC) 5 MG tablet Take 1 tablet (5 mg total) by mouth daily. 12/29/17   Berton Mount I, MD  atorvastatin (LIPITOR) 10 MG tablet Take 10 mg by mouth at bedtime.  05/04/13   [provider]  Calcium Carbonate-Vitamin D (CALCIUM-D PO) Take 2 tablets by mouth daily.     [provider]  Carboxymethylcellulose Sodium (REFRESH LIQUIGEL OP) Place 1 drop into both eyes daily as needed (dry eyes).    [provider]   cycloSPORINE (RESTASIS) 0.05 % ophthalmic emulsion 1 drop 2 (two) times daily. 04/20/21   [provider]  diazepam (VALIUM) 10 MG tablet Take 10 mg by mouth daily as needed (seizure).    [provider]  diclofenac sodium (VOLTAREN) 1 % GEL Apply 1 application topically at bedtime. 02/07/18   [provider]  fluticasone (FLONASE) 50 MCG/ACT nasal spray Place 2 sprays into both nostrils at bedtime as needed for allergies.     [provider]  lacosamide (VIMPAT) 200 MG TABS tablet Take 1 tablet by mouth twice daily 10/12/22   Penumalli, Glenford Bayley, MD  levothyroxine (SYNTHROID, LEVOTHROID) 25 MCG tablet Take 25 mcg by mouth daily before breakfast.  05/04/13   [provider]  lidocaine-prilocaine (EMLA) cream Apply 1 Application topically as needed. 05/06/22   McDonald, Rachelle Hora, DPM  loperamide (IMODIUM A-D) 2 MG tablet Take 1 mg by mouth 4 (four) times daily as needed for diarrhea or loose stools.    [provider]  LORazepam (ATIVAN) 1 MG tablet Take 1 tablet (1 mg total) by mouth 2 (two) times daily as needed for anxiety. 06/12/21   Arby Barrette, MD  meloxicam (MOBIC) 7.5 MG tablet  07/28/19   [provider]  methocarbamol (ROBAXIN) 750 MG tablet Take 1 tablet (750 mg total) by mouth 2 (two) times daily as needed for muscle spasms. Patient taking differently: Take 750 mg by mouth at bedtime.  12/28/17   Barnetta Chapel, MD  Multiple Vitamins-Minerals (MULTIVITAMIN ADULT) TABS Take 1 tablet by mouth daily.    [provider]  Omega-3 Fatty Acids (FISH OIL) 1000 MG CAPS Take 1,000 mg by mouth in the morning and at bedtime.    [provider]  oxyCODONE-acetaminophen (PERCOCET/ROXICET) 5-325 MG tablet Take 1-2 tablets by mouth every 6 (six) hours as needed for severe pain. 12/02/22   Ernie Avena, MD  polyethylene glycol (MIRALAX / Ethelene Hal) packet Take 4.25 g by mouth daily.     [provider]  potassium  chloride SA (KLOR-CON M) 20 MEQ tablet Take 1 tablet (20 mEq total) by mouth daily for 14 days. 05/16/22 05/30/22  Nira Conn, MD  rOPINIRole (REQUIP) 1 MG tablet Take 1 tablet (1 mg total) by mouth daily. 12/29/17   Barnetta Chapel, MD  sodium chloride (OCEAN) 0.65 % SOLN nasal spray Place 1 spray into both nostrils as needed for congestion.    [provider]  topiramate (TOPAMAX) 100 MG tablet Take 1 tablet (100 mg total) by mouth 2 (two) times daily. 05/12/22   Penumalli, Glenford Bayley, MD  topiramate (TOPAMAX) 25 MG tablet Take 1 tablet by mouth twice daily 05/25/22   Penumalli, Glenford Bayley, MD  vitamin E 400 UNIT capsule Take 400 Units by mouth at bedtime.    [provider]      Allergies    Peanut oil, Nickel, Other, and Sulfa antibiotics    Review of Systems   Review of Systems  Physical Exam Updated Vital Signs BP (!) 159/74   Pulse 65   Temp 97.6 F (36.4 C) (Oral)   Resp 18   Ht 5\' 4"  (1.626 m)   Wt 66.7 kg   SpO2 100%   BMI 25.23 kg/m  Physical Exam Constitutional:      General: She is not in acute distress.    Appearance: She is not ill-appearing.  HENT:     Head: Normocephalic and atraumatic.     Right Ear: External ear normal.     Left Ear: External ear normal.     Nose: Nose normal.     Mouth/Throat:     Mouth: Mucous membranes are moist.  Eyes:     Extraocular Movements: Extraocular movements intact.     Conjunctiva/sclera: Conjunctivae normal.  Cardiovascular:     Rate and Rhythm: Normal rate and regular rhythm.     Pulses: Normal pulses.  Pulmonary:     Effort: Pulmonary effort is normal. No respiratory distress.     Breath sounds: No wheezing.  Abdominal:     General: Abdomen is flat.     Tenderness: There is abdominal tenderness. There is no guarding or rebound.     Comments: Mild generalized abdominal tenderness to palpation  Musculoskeletal:        General: Normal range of motion.     Right lower leg: No edema.     Left  lower leg: No edema.  Skin:    General: Skin is warm and dry.     Findings: No rash.  Neurological:     Mental Status: She is alert.     Cranial Nerves: No cranial nerve deficit.     Sensory: No sensory deficit.     Motor: No weakness.     Coordination: Coordination normal.     Gait: Gait normal.     Comments: Expressive aphasia, but patient concentrates she is able to say the words that she  wants.  Correctly able to identify thumb, watch, pen.  No pronator drift.  Strength is 5 out of 5 in all extremities.  Subjective normal light touch sensation everywhere.  Is ambulatory within the emergency department and steady on her feet     ED Results / Procedures / Treatments   Labs (all labs ordered are listed, but only abnormal results are displayed) Labs Reviewed  CBC - Abnormal; Notable for the following components:      Result Value   RBC 3.63 (*)    Hemoglobin 11.0 (*)    HCT 33.9 (*)    All other components within normal limits  COMPREHENSIVE METABOLIC PANEL - Abnormal; Notable for the following components:   Potassium 3.4 (*)    Glucose, Bld 115 (*)    Total Protein 6.2 (*)    All other components within normal limits  URINALYSIS, W/ REFLEX TO CULTURE (INFECTION SUSPECTED) - Abnormal; Notable for the following components:   Color, Urine STRAW (*)    Specific Gravity, Urine 1.004 (*)    Hgb urine dipstick LARGE (*)    Leukocytes,Ua SMALL (*)    Bacteria, UA RARE (*)    All other components within normal limits  I-STAT CHEM 8, ED - Abnormal; Notable for the following components:   Potassium 3.4 (*)    Glucose, Bld 113 (*)    Hemoglobin 11.2 (*)    HCT 33.0 (*)    All other components within normal limits  PROTIME-INR  APTT  DIFFERENTIAL  ETHANOL  TOPIRAMATE LEVEL    EKG EKG Interpretation Date/Time:  Sunday January 24 2023 14:30:04 EDT Ventricular Rate:  64 PR Interval:  174 QRS Duration:  95 QT Interval:  405 QTC Calculation: 418 R Axis:   -25  Text  Interpretation: Sinus rhythm Abnormal R-wave progression, late transition Inferior infarct, old No significant change since last tracing Confirmed by Richardean Canal [phone]) on 01/24/2023 3:13:38 PM  Radiology CT ANGIO HEAD NECK W WO CM  Result Date: 01/24/2023 CLINICAL DATA:  Transient ischemic attack (TIA) Neuro deficit, acute, stroke suspected EXAM: CT ANGIOGRAPHY HEAD AND NECK WITH AND WITHOUT CONTRAST TECHNIQUE: Multidetector CT imaging of the head and neck was performed using the standard protocol during bolus administration of intravenous contrast. Multiplanar CT image reconstructions and MIPs were obtained to evaluate the vascular anatomy. Carotid stenosis measurements (when applicable) are obtained utilizing NASCET criteria, using the distal internal carotid diameter as the denominator. RADIATION DOSE REDUCTION: This exam was performed according to the departmental dose-optimization program which includes automated exposure control, adjustment of the mA and/or kV according to patient size and/or use of iterative reconstruction technique. CONTRAST:  75mL OMNIPAQUE IOHEXOL 350 MG/ML SOLN COMPARISON:  None Available. FINDINGS: CT HEAD FINDINGS Brain: No hemorrhage. No hydrocephalus. No extra-axial fluid collection. No CT evidence of an acute cortical infarct. No mass effect. No mass lesion. Vascular: No hyperdense vessel or unexpected calcification. Skull: Normal. Negative for fracture or focal lesion. Sinuses/Orbits: No middle ear or mastoid effusion. Paranasal sinuses are clear. Orbits are unremarkable. Other: None. Review of the MIP images confirms the above findings CTA NECK FINDINGS Aortic arch: Standard branching. Imaged portion shows no evidence of aneurysm or dissection. No significant stenosis of the major arch vessel origins. Right carotid system: No evidence of dissection, stenosis (50% or greater), or occlusion. Left carotid system: No evidence of dissection, stenosis (50% or greater), or  occlusion. Vertebral arteries: Codominant. No evidence of dissection, stenosis (50% or greater), or occlusion. Skeleton: Heterogeneous  appearance of the marrow without a discrete lesion identified. Other neck: Negative. Upper chest: Negative. Review of the MIP images confirms the above findings CTA HEAD FINDINGS Anterior circulation: No significant stenosis, proximal occlusion, aneurysm, or vascular malformation. Posterior circulation: No significant stenosis, proximal occlusion, aneurysm, or vascular malformation. Venous sinuses: As permitted by contrast timing, patent. Anatomic variants: Fetal PCA on the left. Review of the MIP images confirms the above findings IMPRESSION: 1. No acute intracranial abnormality. 2. No large vessel occlusion or hemodynamically significant stenosis. Electronically Signed   By: Lorenza Cambridge M.D.   On: 01/24/2023 17:45   DG Chest Portable 1 View  Result Date: 01/24/2023 CLINICAL DATA:  Altered mental status EXAM: PORTABLE CHEST 1 VIEW COMPARISON:  06/12/2021 FINDINGS: The heart size and mediastinal contours are within normal limits. Both lungs are clear. The visualized skeletal structures are unremarkable. IMPRESSION: No acute abnormality of the lungs in AP portable projection. Electronically Signed   By: Jearld Lesch M.D.   On: 01/24/2023 16:25    Procedures Procedures    Medications Ordered in ED Medications  lacosamide (VIMPAT) 200 mg in sodium chloride 0.9 % 25 mL IVPB (0 mg Intravenous Stopped 01/24/23 1746)  sodium chloride 0.9 % bolus 1,000 mL (1,000 mLs Intravenous New Bag/Given 01/24/23 1537)  iohexol (OMNIPAQUE) 350 MG/ML injection 75 mL (75 mLs Intravenous Contrast Given 01/24/23 1648)    ED Course/ Medical Decision Making/ A&P                                 Medical Decision Making Amount and/or Complexity of Data Reviewed Labs: ordered. Radiology: ordered.  Risk Prescription drug management.   72 year old female with past medical history  and HPI as above.  On arrival, patient mildly hypertensive at 143/66, but no other vital sign abnormalities.  She exhibits some expressive aphasia during examination, but has no other sensorimotor or neurologic deficits appreciated.  Last known normal nearly 17 hours ago; outside of any TNK window.  Glucose 113.  Neurology note from 05/12/2022 reviewed.  Patient has a diagnosis of partial symptomatic epilepsy with complex partial seizures.  At that time she was instructed to continue Vimpat 200 mg twice daily.  Her topiramate dosing was reduced from 125 mg twice daily to 100 mg twice daily due to concerns for memory complaints.  Given patient's expressive aphasia, will proceed with stroke workup.  However, will not call code stroke given atypical symptoms and duration since last known normal.  Will proceed with CTA head and neck as well as laboratory panel.  Will additionally check for signs of infection that could contribute to lowering seizure threshold such as UTI or pneumonia.  In the interim, patient will be loaded with Vimpat.  Will also check a topiramate level.  EKG shows normal sinus rhythm with a rate of 64.  There are some what appear to be Q waves in the inferior leads, but no concerning ST segment elevations or depressions.  Not a STEMI.  Low concern for acute ischemia.  Chest x-ray shows clear lung fields bilaterally with sharp costophrenic angles.  No focal lung air space disease suggestive of pneumonia.  CT of patient's head and neck were reviewed and do not show any signs of bleed, large vessel occlusion, or vascular abnormality.  Remainder of patient's lab work reviewed.  No leukocytosis.  Potassium is mildly low at 3.4, but no other gross metabolic derangements.  Urinalysis has small  leukocytes and rare bacteria, but she does not have any urinary symptoms such as abdominal pain, dysuria, frequency.  Will not treat as UTI.  Other causes of the neurological findings were considered and  felt to be less likely, and include infectious causes (encephalopathy, meningitis), tumor/abscess, toxic causes (hypoglycemia, renal failure, hepatic failure, toxic ingestion), other neurologic disorders (seizure, migraine, Todd paralysis, GBS, status epilepticus, BPPV, disc herniation, diabetic neuropathy), psychiatric causes (conversion) and trauma (ICH, spinal injury).   On reassessment, patient is speaking in clear full sentences without any signs of aphasia.  No seizure activity exhibited.  She is well-appearing overall.  We discussed the above results in detail.  We discussed her CT results patient says "I did not expect you to find anything."  She is comfortable returning home with primary care and neurology follow-up as an outpatient.  Will hold off on brain MRI imaging as patient states that the symptoms are similar to when she has had seizures in the past and is currently asymptomatic at this time.  She was instructed to continue taking all medications as prescribed.  Strict return precautions provided.  Pt was discussed with my attending, Dr. Silverio Lay.    Final Clinical Impression(s) / ED Diagnoses Final diagnoses:  Aphasia    Rx / DC Orders ED Discharge Orders     None         Lyman Speller, MD 01/24/23 1839    Charlynne Pander, MD 01/24/23 2330

## 2023-01-24 NOTE — ED Notes (Signed)
Pt ambulatory to bathroom with strong and steady gait.

## 2023-01-25 LAB — TOPIRAMATE LEVEL: Topiramate Lvl: 10.2 ug/mL (ref 2.0–25.0)

## 2023-02-04 ENCOUNTER — Encounter (HOSPITAL_COMMUNITY): Payer: Self-pay

## 2023-02-04 ENCOUNTER — Emergency Department (HOSPITAL_COMMUNITY): Payer: 59

## 2023-02-04 ENCOUNTER — Other Ambulatory Visit: Payer: Self-pay

## 2023-02-04 ENCOUNTER — Emergency Department (HOSPITAL_COMMUNITY)
Admission: EM | Admit: 2023-02-04 | Discharge: 2023-02-04 | Disposition: A | Discharge status: Home / Self Care | Payer: 59 | Attending: Emergency Medicine | Admitting: Emergency Medicine

## 2023-02-04 DIAGNOSIS — R42 Dizziness and giddiness: Secondary | ICD-10-CM

## 2023-02-04 DIAGNOSIS — S0990XA Unspecified injury of head, initial encounter: Secondary | ICD-10-CM | POA: Diagnosis not present

## 2023-02-04 DIAGNOSIS — Z1152 Encounter for screening for COVID-19: Secondary | ICD-10-CM | POA: Insufficient documentation

## 2023-02-04 DIAGNOSIS — W19XXXA Unspecified fall, initial encounter: Secondary | ICD-10-CM | POA: Diagnosis not present

## 2023-02-04 DIAGNOSIS — N3000 Acute cystitis without hematuria: Secondary | ICD-10-CM | POA: Diagnosis not present

## 2023-02-04 DIAGNOSIS — D72829 Elevated white blood cell count, unspecified: Secondary | ICD-10-CM | POA: Insufficient documentation

## 2023-02-04 LAB — MAGNESIUM: Magnesium: 2 mg/dL (ref 1.7–2.4)

## 2023-02-04 LAB — LIPASE, BLOOD: Lipase: 30 U/L (ref 11–51)

## 2023-02-04 LAB — COMPREHENSIVE METABOLIC PANEL
ALT: 29 U/L (ref 0–44)
AST: 30 U/L (ref 15–41)
Albumin: 3.2 g/dL — ABNORMAL LOW (ref 3.5–5.0)
Alkaline Phosphatase: 45 U/L (ref 38–126)
Anion gap: 11 (ref 5–15)
BUN: 20 mg/dL (ref 8–23)
CO2: 21 mmol/L — ABNORMAL LOW (ref 22–32)
Calcium: 9 mg/dL (ref 8.9–10.3)
Chloride: 107 mmol/L (ref 98–111)
Creatinine, Ser: 0.86 mg/dL (ref 0.44–1.00)
GFR, Estimated: >60 mL/min (ref >60)
Glucose, Bld: 151 mg/dL — ABNORMAL HIGH (ref 70–99)
Potassium: 3.3 mmol/L — ABNORMAL LOW (ref 3.5–5.1)
Sodium: 139 mmol/L (ref 135–145)
Total Bilirubin: 0.3 mg/dL (ref 0.3–1.2)
Total Protein: 6.5 g/dL (ref 6.5–8.1)

## 2023-02-04 LAB — URINALYSIS, ROUTINE W REFLEX MICROSCOPIC
Bilirubin Urine: NEGATIVE
Glucose, UA: NEGATIVE mg/dL
Hgb urine dipstick: NEGATIVE
Ketones, ur: NEGATIVE mg/dL
Nitrite: NEGATIVE
Protein, ur: 30 mg/dL — AB
Specific Gravity, Urine: 1.026 (ref 1.005–1.030)
pH: 5 (ref 5.0–8.0)

## 2023-02-04 LAB — RESP PANEL BY RT-PCR (RSV, FLU A&B, COVID)  RVPGX2
Influenza A by PCR: NEGATIVE
Influenza B by PCR: NEGATIVE
Resp Syncytial Virus by PCR: NEGATIVE
SARS Coronavirus 2 by RT PCR: NEGATIVE

## 2023-02-04 LAB — CBC
HCT: 31.5 % — ABNORMAL LOW (ref 36.0–46.0)
Hemoglobin: 10 g/dL — ABNORMAL LOW (ref 12.0–15.0)
MCH: 29.6 pg (ref 26.0–34.0)
MCHC: 31.7 g/dL (ref 30.0–36.0)
MCV: 93.2 fL (ref 80.0–100.0)
Platelets: 240 10*3/uL (ref 150–400)
RBC: 3.38 MIL/uL — ABNORMAL LOW (ref 3.87–5.11)
RDW: 12.3 % (ref 11.5–15.5)
WBC: 8.4 10*3/uL (ref 4.0–10.5)
nRBC: 0 % (ref 0.0–0.2)

## 2023-02-04 MED ORDER — MECLIZINE HCL 25 MG PO TABS: 25.0000 mg | ORAL_TABLET | Freq: Three times a day (TID) | ORAL | 0 refills | Status: AC | PRN

## 2023-02-04 MED ORDER — CEPHALEXIN 500 MG PO CAPS: 500.0000 mg | ORAL_CAPSULE | Freq: Two times a day (BID) | ORAL | 0 refills | Status: AC

## 2023-02-04 MED ORDER — CEPHALEXIN 250 MG PO CAPS
500.0000 mg | ORAL_CAPSULE | Freq: Once | ORAL | Status: AC
  Administered 2023-02-04: 500 mg via ORAL
  Filled 2023-02-04: qty 2

## 2023-02-04 MED ORDER — SODIUM CHLORIDE 0.9 % IV BOLUS
500.0000 mL | Freq: Once | INTRAVENOUS | Status: AC
  Administered 2023-02-04: 500 mL via INTRAVENOUS

## 2023-02-04 MED ORDER — ACETAMINOPHEN 500 MG PO TABS
1000.0000 mg | ORAL_TABLET | Freq: Once | ORAL | Status: AC
  Administered 2023-02-04: 1000 mg via ORAL
  Filled 2023-02-04: qty 2

## 2023-02-04 NOTE — Discharge Instructions (Signed)
He was seen in the emergency room today with lightheadedness and lower abdominal discomfort.  I am treating you for urinary tract infection.  Please take the antibiotic as prescribed.  I have also called in some meclizine which you can take as needed for dizziness but I would try the antibiotic and fluids at home first.  Please call your primary care doctor in the morning to schedule a follow-up appointment, ideally in the next week.  Return with any new or suddenly worsening symptoms.

## 2023-02-04 NOTE — ED Notes (Signed)
Pt able to ambulate with little assistance.

## 2023-02-04 NOTE — ED Provider Triage Note (Signed)
Emergency Medicine Provider Triage Evaluation Note  Teresa Nguyen , a 72 y.o. female  was evaluated in triage.  Pt complains of weak. Report having LLQ abd pain, feeling bloated, was having diarrhea but now no BMs for 5 days, having tremors, feeling dehydrated, and having headache.  No cough, sob, cp, or dysuria.  Review of Systems  Positive: As above Negative: As above  Physical Exam  BP 119/72 (BP Location: Right Arm)   Pulse 80   Temp 98.1 F (36.7 C)   Resp 16   Ht 5\' 4"  (1.626 m)   Wt 66.4 kg   SpO2 99%   BMI 25.13 kg/m  Gen:   Awake, no distress   Resp:  Normal effort  MSK:   Moves extremities without difficulty  Other:    Medical Decision Making  Medically screening exam initiated at 12:24 PM.  Appropriate orders placed.  SHEQUILA WEITKAMP was informed that the remainder of the evaluation will be completed by another provider, this initial triage assessment does not replace that evaluation, and the importance of remaining in the ED until their evaluation is complete.     Fayrene Helper, PA-C 02/04/23 1229

## 2023-02-04 NOTE — ED Provider Notes (Signed)
Emergency Department Provider Note   I have reviewed the triage vital signs and the nursing notes.   HISTORY  Chief Complaint Abdominal Pain   HPI Teresa Nguyen is a 72 y.o. female with past history reviewed below presents to the emergency department with severe dizziness/lightheadedness along with abdominal cramping pain over the past 2 to 3 days.  She is unsure if it is spinning/vertigo sensation versus lightheaded feeling.  She tends to feel well in the morning and then after eating breakfast has cramping abdominal discomfort along with lightheadedness.  She states episodes are severe to the point where she cannot stand or walk and has to scoot around on the floor at home.  She did have a fall with head trauma 2 days prior and a small mark to her forehead.  No unilateral weakness or numbness that she is appreciated.  Denies any chest pain, palpitations, shortness of breath. No dysuria, hesitancy, or urgency.    Past Medical History:  Diagnosis Date   Anxiety    Arthritis    Arthritis of neck    DVT (deep venous thrombosis) (HCC) 2008   L leg   Finger fracture, right    small   Major depressive disorder    pt denies   Migraine    Seizure disorder [facility])    since age 77-3, last sz 12/31/15   Thyroid disease    Wears partial dentures     Review of Systems  Constitutional: No fever/chills. Positive lightheadedness.  Eyes: No visual changes. ENT: No sore throat. Cardiovascular: Denies chest pain. Respiratory: Denies shortness of breath. Gastrointestinal: Positive abdominal pain.  Positive nausea, no vomiting.  No diarrhea.  Positive constipation x 1 week.  Genitourinary: Negative for dysuria. Musculoskeletal: Negative for back pain. Skin: Negative for rash. Neurological: Negative for headaches, focal weakness or numbness.  ____________________________________________   PHYSICAL EXAM:  VITAL SIGNS: ED Triage Vitals  Encounter Vitals Group     BP 02/04/23 1210  119/72     Pulse Rate 02/04/23 1210 80     Resp 02/04/23 1210 16     Temp 02/04/23 1210 98.1 F (36.7 C)     Temp src --      SpO2 02/04/23 1210 99 %     Weight 02/04/23 1219 146 lb 6.2 oz (66.4 kg)     Height 02/04/23 1219 5\' 4"  (1.626 m)    Constitutional: Alert and oriented. Well appearing and in no acute distress. Eyes: Conjunctivae are normal.  Head: Faint abrasion to the mid forehead.  Nose: No congestion/rhinnorhea. Mouth/Throat: Mucous membranes are moist.  Oropharynx non-erythematous. Neck: No stridor.  No cervical spine tenderness to palpation. Cardiovascular: Normal rate, regular rhythm. Good peripheral circulation. Grossly normal heart sounds.   Respiratory: Normal respiratory effort.  No retractions. Lungs CTAB. Gastrointestinal: Soft and nontender. No distention.  Musculoskeletal: No lower extremity tenderness nor edema. No gross deformities of extremities. Neurologic:  Normal speech and language. No gross focal neurologic deficits are appreciated. Normal finger to nose Skin:  Skin is warm, dry and intact. No rash noted.   ____________________________________________   LABS (all labs ordered are listed, but only abnormal results are displayed)  Labs Reviewed  COMPREHENSIVE METABOLIC PANEL - Abnormal; Notable for the following components:      Result Value   Potassium 3.3 (*)    CO2 21 (*)    Glucose, Bld 151 (*)    Albumin 3.2 (*)    All other components within normal limits  CBC - Abnormal; Notable for the following components:   RBC 3.38 (*)    Hemoglobin 10.0 (*)    HCT 31.5 (*)    All other components within normal limits  URINALYSIS, ROUTINE W REFLEX MICROSCOPIC - Abnormal; Notable for the following components:   Color, Urine AMBER (*)    APPearance HAZY (*)    Protein, ur 30 (*)    Leukocytes,Ua LARGE (*)    Bacteria, UA FEW (*)    All other components within normal limits  RESP PANEL BY RT-PCR (RSV, FLU A&B, COVID)  RVPGX2  LIPASE, BLOOD   MAGNESIUM   ____________________________________________  EKG  Rate: 80 PR: 150 QTc: 408  Sinus rhythm. Narrow QRS. No STEMI.  ____________________________________________  RADIOLOGY  CT Head Wo Contrast  Result Date: 02/04/2023 CLINICAL DATA:  Head trauma, minor (Age >= 65y). Tremors. Headache. EXAM: CT HEAD WITHOUT CONTRAST TECHNIQUE: Contiguous axial images were obtained from the base of the skull through the vertex without intravenous contrast. RADIATION DOSE REDUCTION: This exam was performed according to the departmental dose-optimization program which includes automated exposure control, adjustment of the mA and/or kV according to patient size and/or use of iterative reconstruction technique. COMPARISON:  CTA head and neck 01/24/2023. MRI head 05/16/2022 and 05/24/2013. FINDINGS: Brain: There is no evidence of an acute infarct, intracranial hemorrhage, mass, midline shift, or significant extra-axial fluid collection. The ventricles and sulci are normal. Slightly prominent extra-axial CSF over both cerebellar hemispheres is unchanged from 2015 and without significant associated mass effect. Vascular: No hyperdense vessel. Skull: No acute fracture or suspicious osseous lesion. Sinuses/Orbits: Paranasal sinuses and mastoid air cells are clear. Unremarkable orbits. Other: None. IMPRESSION: No evidence of acute intracranial abnormality. Electronically Signed   By: Sebastian Ache M.D.   On: 02/04/2023 18:56   DG ABD ACUTE 2+V W 1V CHEST  Result Date: 02/04/2023 CLINICAL DATA:  Abdominal pain.  Lack of bowel movements. EXAM: DG ABDOMEN ACUTE WITH 1 VIEW CHEST COMPARISON:  Abdominal radiograph dated January 13, 2023. FINDINGS: There is no evidence of dilated bowel loops or free intraperitoneal air. No radiopaque calculi or other significant radiographic abnormality is seen. Heart size and mediastinal contours are within normal limits. Both lungs are clear. IMPRESSION: Negative abdominal  radiographs.  No acute cardiopulmonary disease. Electronically Signed   By: Hart Robinsons M.D.   On: 02/04/2023 14:27    ____________________________________________   PROCEDURES  Procedure(s) performed:   Procedures  None ____________________________________________   INITIAL IMPRESSION / ASSESSMENT AND PLAN / ED COURSE  Pertinent labs & imaging results that were available during my care of the patient were reviewed by me and considered in my medical decision making (see chart for details).   This patient is Presenting for Evaluation of abd pain, which does require a range of treatment options, and is a complaint that involves a high risk of morbidity and mortality.  The Differential Diagnoses includes but is not exclusive to acute cholecystitis, intrathoracic causes for epigastric abdominal pain, gastritis, duodenitis, pancreatitis, small bowel or large bowel obstruction, abdominal aortic aneurysm, hernia, gastritis, etc.   Critical Interventions-    Medications  cephALEXin (KEFLEX) capsule 500 mg (has no administration in time range)  acetaminophen (TYLENOL) tablet 1,000 mg (has no administration in time range)  sodium chloride 0.9 % bolus 500 mL (500 mLs Intravenous New Bag/Given 02/04/23 1835)    Reassessment after intervention: patient ambulatory after IVF. Feeling well. No gait instability.   I decided to review pertinent External Data, and  in summary patient with    Clinical Laboratory Tests Ordered, included UA with large leukocytes and few bacterial with elevated WBCs. CBC without leukocytosis.   Radiologic Tests Ordered, included CT head and abdominal XR. I independently interpreted the images and agree with radiology interpretation.   Cardiac Monitor Tracing which shows NSR.    Social Determinants of Health Risk patient is not an active smoker.   Medical Decision Making: Summary:  Patient presents to the ED with lower abdominal discomfort and dizziness. No  focal neuro deficits to suspect central vertigo. Plan for CT head given trauma after fall, IVF, and labs.   Reevaluation with update and discussion with patient. She is feeling better after IVF. She is ambulatory here without assistance. Considered MRI but given reassuring exam and improved symptoms plan to treat for possible UTI with Keflex and d/c with close PCP follow up. Discussed with patient who is comfortable with plan at discharge.   Considered admission but labs and exam are reassuring.   Patient's presentation is most consistent with acute presentation with potential threat to life or bodily function.   Disposition: discharge  ____________________________________________  FINAL CLINICAL IMPRESSION(S) / ED DIAGNOSES  Final diagnoses:  Acute cystitis without hematuria  Episodic lightheadedness  Fall, initial encounter  Injury of head, initial encounter     NEW OUTPATIENT MEDICATIONS STARTED DURING THIS VISIT:  New Prescriptions   CEPHALEXIN (KEFLEX) 500 MG CAPSULE    Take 1 capsule (500 mg total) by mouth 2 (two) times daily for 7 days.   MECLIZINE (ANTIVERT) 25 MG TABLET    Take 1 tablet (25 mg total) by mouth 3 (three) times daily as needed for dizziness.    Note:  This document was prepared using Dragon voice recognition software and may include unintentional dictation errors.  Alona Bene, MD, [facility] Emergency Medicine    Devone Tousley, Arlyss Repress, MD 02/04/23 [phone]

## 2023-02-04 NOTE — ED Triage Notes (Signed)
Pt with abd pain/nausea on the 10th of October. Pt also endorses diarrhea. No cough. Abd pain is lower. Pt also complains of twitches and anxiety.

## 2023-02-09 ENCOUNTER — Telehealth: Payer: Self-pay | Admitting: Diagnostic Neuroimaging

## 2023-02-09 NOTE — Telephone Encounter (Signed)
Pt said have a UTI, taking antibiotic,Cephalexin Went online to research and something in the UTI makes the seizure medication not work. Thursday head was spinning so bad could not stand up. Not on  lacosamide (VIMPAT) 200 MG TABS tablet during day, but take at night. If have any questions can call back.

## 2023-02-15 ENCOUNTER — Telehealth: Payer: Self-pay | Admitting: Diagnostic Neuroimaging

## 2023-02-15 NOTE — Telephone Encounter (Signed)
Pt reports she has been sick with a bad UTI, the UTI has since cleared up.  Pt continues to take the Valproic acid at night, but would like to know if she could stop taking it in the morning, please call

## 2023-02-15 NOTE — Telephone Encounter (Signed)
Called the patient back. Advised that I had responded to her call via mychart message last week and apologized for not seeing that she had not read that. Asked the patient to elaborate on what her question specifically is because based off  Pt states that the UTI itself in combination with vimpat can cause problems is what she read [facility]. She states she stopped the vimpat last week and since stopping she has not had seizures and the dizziness also has cleared. She is wanting to know if she should stay off the medication. Advised that it would not be recommended for her to remain off the medication. UTI can actually place at increase risk for seizures re occurring and if she is not on her medications this can be at a higher risk. .  Advised the pt to restart her medications as prescribed. She verbalized understanding.

## 2023-02-20 NOTE — Progress Notes (Signed)
Pt chief complaint is weakness, dizziness, and diarrhea.  Respiratory panel ordered as viral infectious cause can contribute to her initial complaint.

## 2023-03-14 ENCOUNTER — Other Ambulatory Visit: Payer: Self-pay | Admitting: Diagnostic Neuroimaging

## 2023-03-14 DIAGNOSIS — G40219 Localization-related (focal) (partial) symptomatic epilepsy and epileptic syndromes with complex partial seizures, intractable, without status epilepticus: Secondary | ICD-10-CM

## 2023-03-15 ENCOUNTER — Other Ambulatory Visit: Payer: Self-pay | Admitting: Diagnostic Neuroimaging

## 2023-03-15 DIAGNOSIS — G40219 Localization-related (focal) (partial) symptomatic epilepsy and epileptic syndromes with complex partial seizures, intractable, without status epilepticus: Secondary | ICD-10-CM

## 2023-03-27 ENCOUNTER — Other Ambulatory Visit: Payer: Self-pay | Admitting: Diagnostic Neuroimaging

## 2023-04-20 ENCOUNTER — Other Ambulatory Visit: Payer: Self-pay | Admitting: *Deleted

## 2023-04-20 DIAGNOSIS — G40219 Localization-related (focal) (partial) symptomatic epilepsy and epileptic syndromes with complex partial seizures, intractable, without status epilepticus: Secondary | ICD-10-CM

## 2023-04-20 MED ORDER — TOPIRAMATE 100 MG PO TABS: 100.0000 mg | ORAL_TABLET | Freq: Two times a day (BID) | ORAL | 0 refills | Status: DC

## 2023-05-06 ENCOUNTER — Telehealth: Payer: Self-pay | Admitting: Podiatry

## 2023-05-06 NOTE — Telephone Encounter (Signed)
Pt called and is starting to have the pins and needle feeling in thumb and she would like to know if you had a recommendation on who she should see.

## 2023-05-06 NOTE — Telephone Encounter (Signed)
Notified pt to start with neurologist that she already sees and she said she would call them. Thank you

## 2023-05-24 ENCOUNTER — Telehealth: Payer: Self-pay | Admitting: Podiatry

## 2023-05-24 NOTE — Telephone Encounter (Signed)
 Patient called requesting a refill of her lidocaine  cream. She asked if she could get a higher dose if possible. Thank you.

## 2023-05-28 ENCOUNTER — Other Ambulatory Visit: Payer: Self-pay

## 2023-05-28 DIAGNOSIS — M5417 Radiculopathy, lumbosacral region: Secondary | ICD-10-CM

## 2023-05-28 MED ORDER — LIDOCAINE-PRILOCAINE 2.5-2.5 % EX CREA: 1.0000 | TOPICAL_CREAM | CUTANEOUS | 2 refills | Status: DC | PRN

## 2023-05-28 NOTE — Telephone Encounter (Signed)
Called and let patient know that her refill was sent to the pharmacy.

## 2023-07-13 ENCOUNTER — Other Ambulatory Visit: Payer: Self-pay | Admitting: Diagnostic Neuroimaging

## 2023-07-13 DIAGNOSIS — G40219 Localization-related (focal) (partial) symptomatic epilepsy and epileptic syndromes with complex partial seizures, intractable, without status epilepticus: Secondary | ICD-10-CM

## 2023-07-14 ENCOUNTER — Other Ambulatory Visit: Payer: Self-pay | Admitting: Family Medicine

## 2023-07-14 DIAGNOSIS — M810 Age-related osteoporosis without current pathological fracture: Secondary | ICD-10-CM

## 2023-07-14 DIAGNOSIS — Z1211 Encounter for screening for malignant neoplasm of colon: Secondary | ICD-10-CM

## 2023-08-14 ENCOUNTER — Other Ambulatory Visit: Payer: Self-pay | Admitting: Diagnostic Neuroimaging

## 2023-08-14 DIAGNOSIS — G40219 Localization-related (focal) (partial) symptomatic epilepsy and epileptic syndromes with complex partial seizures, intractable, without status epilepticus: Secondary | ICD-10-CM

## 2023-08-19 NOTE — Telephone Encounter (Signed)
 Requested Prescriptions   Pending Prescriptions Disp Refills   lacosamide  (VIMPAT ) 200 MG TABS tablet [Pharmacy Med Name: Lacosamide  200mg  Tablet] 60 tablet 5    Sig: Take 1 tablet by mouth twice daily   Last seen 05/12/22, next appt 08/20/23 Dispenses   Dispensed Days Supply Quantity Provider Pharmacy  LACOSAMIDE   200 MG TABS 08/09/2023 30 60 tablet Penumalli, Vikram R, MD [facility] [place], [place]...  LACOSAMIDE   200 MG TABS 07/10/2023 30 60 tablet Penumalli, Vikram R, [place] [facility] [place]...  LACOSAMIDE   200 MG TABS 06/10/2023 30 60 tablet Penumalli, Vikram R, [place] [facility] [place]...  LACOSAMIDE   200 MG TABS 05/12/2023 30 60 tablet Penumalli, Vikram R, [place] [facility] [place]...  LACOSAMIDE   200 MG TABS 04/09/2023 30 60 tablet Penumalli, Vikram R, [place] [facility] [place]...  LACOSAMIDE   200 MG TABS 03/10/2023 30 60 tablet Penumalli, Vikram R, [place] [facility] [place]...  LACOSAMIDE   200 MG TABS 02/11/2023 30 60 tablet Penumalli, Vikram R, [place] [facility] [place]...  LACOSAMIDE   200 MG TABS 01/12/2023 30 60 tablet Penumalli, Vikram R, [place] [facility] [place]...  LACOSAMIDE   200 MG TABS 12/11/2022 30 60 tablet Penumalli, Vikram R, [place] [facility] [place]...  LACOSAMIDE   200 MG TABS 11/12/2022 30 60 tablet Penumalli, Vikram R, [place] [facility] [place]...  LACOSAMIDE   200 MG TABS 10/13/2022 30 60 tablet Penumalli, Vikram R, [place] [facility] [place]...  LACOSAMIDE   200 MG TABS 09/15/2022 30 60 tablet Penumalli, Vikram R, MD [facility] [place].Aaron AasAaron Aas

## 2023-08-20 ENCOUNTER — Ambulatory Visit (INDEPENDENT_AMBULATORY_CARE_PROVIDER_SITE_OTHER): Payer: 59 | Admitting: Diagnostic Neuroimaging

## 2023-08-20 ENCOUNTER — Encounter: Payer: Self-pay | Admitting: Diagnostic Neuroimaging

## 2023-08-20 VITALS — BP 122/74 | Ht 64.0 in | Wt 131.0 lb

## 2023-08-20 DIAGNOSIS — G8929 Other chronic pain: Secondary | ICD-10-CM | POA: Diagnosis not present

## 2023-08-20 DIAGNOSIS — R200 Anesthesia of skin: Secondary | ICD-10-CM

## 2023-08-20 DIAGNOSIS — M25562 Pain in left knee: Secondary | ICD-10-CM | POA: Diagnosis not present

## 2023-08-20 NOTE — Patient Instructions (Addendum)
  LEFT FOOT NUMBNESS (since ~ 2005; some tendonitis, metatarsalgia, tarsal tunnel syndrome diagnosed and managed per podiatry in past; now resolved) - continue conservative mgmt; no further neurologic testing recommended; consider PT / sports med eval for left knee issues  SEIZURE DISORDER  - continue vimpat  200mg  twice a day  - continue topiramate ; reduce to 100mg  twice a day; may continue to reduce over time due to memory complaints

## 2023-08-20 NOTE — Progress Notes (Signed)
 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 No seizures. Some ongoing chronic left foot pain / numbness, eval'd by neurology in 2005, no cause found. Then followed by podiatry in 2023 and 2024 and dx'd with  tendonitis, metatarsalgia, tarsal tunnel syndrome. Now left foot numbness is improved. Some ongoing left knee pain. No major issues with hands.   UPDATE (05/12/22, VRP): Since last visit, doing about the same. Did not stop the 25mg  topiramate  as per last visit. No alleviating or aggravating factors. Tolerating meds.    UPDATE (05/12/21, VRP): Since last visit, here for evaluation of memory issues. Mild memory lapses, but no changes in ADLs. In fact, she is able to take care of herself and her neighbors. No spells or seizures.   UPDATE (05/03/18, Dr. Garland Junk, [facility]): 73 year old woman with history of intractable, likely frontal lobe epilepsy. EMU monitoring in 2015 captured three stereotyped events with right temporal slowing and sharp waves. Epileptic seizures, with onset since childhood, consist mostly of oral automatisms with various degrees of evolution. They have been well controlled with LCM, 200 BID, along with TPM 125 BID. She previously failed PB, PHT, LTG, and ZON. Of note, video EEG in 2015 also caught an event with oral dyskinesia that was thought to be nonepileptic. These are more rare.    UPDATE (12/29/17, VRP): Since last visit, doing well until 12/21/17 and 12/27/17 --> chest muscle spasm, difficulty talking, but no LOC. Each attack lasted 10-20 seconds, 30-50 attacks.  Went to ER on 12/21/17. Then again to ER in 12/27/17. Had been taking CBZ for 3 months prior, but felt like it was triggering seizures, so she stopped in early Sept 2019. Mood is stable.   UPDATE (10/11/17, VRP): Since last visit, doing well until 1-2 weeks ago, woke up in night with chest muscle spasm; lasted 30 minutes. Then again over next 2 nights, 30-45 minutes. Also last night with similar events. No LOC or altered consciousness.  No aphasia. Tolerating meds. Having more GI issues (diarrhea) in last few months.   UPDATE (03/17/17, VRP): Since last visit, doing well. Tolerating meds. No alleviating or aggravating factors. No seizures. Has seen Dr. Linde Reveal (psychiatry) and was started on Equetro  (carbamazepine  ER samples).   UPDATE 08/25/16: Since last visit, no seizures. Had some "aura" sensations which consistent of "pulse" in the head sensation, esp with stress. Tolerating meds.   UPDATE 12/31/15: Last night forgot vimpat  dose; this morning at 4am, woke up, had to urinate, stood up and then fell back on to bed, may have had brief LOC. Thinks she had a seizure. No post-ictal confusion.  No tongue biting. Restless legs stable with medication.   UPDATE 12/31/14: Since last visit, doing well, no seizures. Used valium  10mg  x 1 time in the last year for a seizure aura. Tolerating vimpat  200mg  BID + TPX 100mg  BID. Went to ER in Feb for syncope (dx with dehydration).   UPDATE 12/29/13 (LL): Since last visit, went to [facility] to have EMU monitoring. EMU report --> June 15-18,  2015: "Long-term video EEG monitoring captured multiple events; including electrographic frontal hypermotor seizures. She also exhibited episodes of oral dyskinetic activity which is not seizure (no EEG correlate)." It was concluded that her typical arousal events are consistent with frontal lobe epilepsy. Home medications Vimpat  200 mg twice a day,Topirimate 100 mg twice a day were resumed, but they recommended reducing home dose of Valium  as  she was taking 10-20 mg when necessary which likely to affects her affect and impairs her communication. She has had several close family members who have died in the last year, including her mother and her sister who died suddenly. Patient is very content on her current medication regimen, stating that she has felt better on Vimpat  than ever before. She has not had many nocturnal seizures since starting Vimpat , and states that she was afraid to go to sleep prior to starting it. Her past AEDs have been phenobarbital, phenytoin, Lacosamide , lamotrigine, levetiracetam, Topirimate, valproic acid and zonisamide. Known seizure triggers are eating broccoli or eggplant. RLS symptoms are controlled on Ropinirole , although some nights she must take an extra 0.25 of a tablet to be comfortable.  UPDATE 12/28/12: Outside neurologist records received and reviewed. 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 Since last visit, no more seizures or spells. Her RLS is getting worse, b/c she is running out of ropinirole  and was reducing the dose. Still with high stress. Not interested in seeing psychiatry.   UPDATE 06/10/12: Since last visit, sz stopped, until Dec 2013. She was preparing for colonoscopy, and anticipated having diarrhea from bowel prep. Before she took the bowel prep meds, she started with diarrhea. Soon after she had a seizure (no LOC, eye up, head turned, mouth twitching; no generalized convulsions. she could hear and move, but not talk). She took a valium , and her spell stopped. I still haven't received records from prior neurologist. Patient tells me that she was dx'd with "partial non-epileptic seizures". She was told to take valium  prn to stop her seizures at home, in order to avoid freq ER trips.    PRIOR HPI (12/04/11): 73 year old right-handed female here for evaluation of seizure disorder. At age 43 years old, patient fell down a flight of stairs. Soon after she began to  have "petit mall seizures". Patient states she was having inability to speak, convulsions and tongue biting. She started on Dilantin and phenobarbital early in life. Over the years she tried "many different seizure medications". She was living in [place]  for most of her life and followed by neurology there. She had extensive testing with EEG, video EEG, sleep study, MRI, without any significant abnormalities. Unclear if any of these seizures were captured during EEG recordings. It sounds like no epileptiform discharges were ever noted. Unfortunately I do not have any of her prior records to review myself for this visit. Patient has been on Vimpat  and topiramate  over the past year, and now has one episode every 3 weeks of "seizure". Current episodes consist of gagging sensation, inability to speak, without loss of consciousness tongue biting or convulsions.    REVIEW OF SYSTEMS: Full 14 system review of systems performed and negative except: seizure drooling memory loss.   ALLERGIES: Allergies  Allergen Reactions   Peanut Oil Diarrhea    Nuts in general    Nickel Other (See Comments)   Other Diarrhea    Nuts and dark green vegetables cause diarrhea and dehydration   Sulfa Antibiotics Itching and Rash    Red  all over    HOME MEDICATIONS: Outpatient Medications Prior to Visit  Medication Sig Dispense Refill   acetaminophen  (TYLENOL ) 500 MG tablet Take 500 mg by mouth 2 (two) times daily as needed for moderate pain or headache.     amLODipine  (NORVASC ) 5 MG tablet Take 1 tablet (5 mg total) by mouth daily. 30 tablet 0   atorvastatin  (LIPITOR) 10 MG tablet Take 10 mg by mouth at bedtime.      Calcium  Carbonate-Vitamin D (CALCIUM -D PO) Take 2 tablets by mouth daily.      Carboxymethylcellulose Sodium (REFRESH LIQUIGEL OP) Place 1 drop into both eyes daily as needed (dry eyes).     cycloSPORINE (RESTASIS) 0.05 % ophthalmic emulsion 1 drop 2 (two) times daily.     diazepam  (VALIUM ) 10 MG tablet  Take 10 mg by mouth daily as needed (seizure).     diclofenac sodium (VOLTAREN) 1 % GEL Apply 1 application topically at bedtime.     fluticasone (FLONASE) 50 MCG/ACT nasal spray Place 2 sprays into both nostrils at bedtime as needed for allergies.      lacosamide  (VIMPAT ) 200 MG TABS tablet Take 1 tablet by mouth twice daily 60 tablet 5   levothyroxine  (SYNTHROID , LEVOTHROID) 25 MCG tablet Take 25 mcg by mouth daily before breakfast.      lidocaine -prilocaine  (EMLA ) cream Apply 1 Application topically as needed. 60 g 2   loperamide (IMODIUM A-D) 2 MG tablet Take 1 mg by mouth 4 (four) times daily as needed for diarrhea or loose stools.     LORazepam  (ATIVAN ) 1 MG tablet Take 1 tablet (1 mg total) by mouth 2 (two) times daily as needed for anxiety. 15 tablet 0   meclizine  (ANTIVERT ) 25 MG tablet Take 1 tablet (25 mg total) by mouth 3 (three) times daily as needed for dizziness. 30 tablet 0   meloxicam (MOBIC) 7.5 MG tablet      methocarbamol  (ROBAXIN ) 750 MG tablet Take 1 tablet (750 mg total) by mouth 2 (two) times daily as needed for muscle spasms. (Patient taking differently: Take 750 mg by mouth at bedtime.) 20 tablet 0   Multiple Vitamins-Minerals (MULTIVITAMIN ADULT) TABS Take 1 tablet by mouth daily.     Omega-3 Fatty Acids (FISH OIL) 1000 MG CAPS Take 1,000 mg by mouth in the morning and at bedtime.     oxyCODONE -acetaminophen  (PERCOCET/ROXICET) 5-325 MG tablet Take 1-2 tablets by mouth every 6 (six) hours as needed for severe pain. 15 tablet 0   polyethylene glycol (MIRALAX  / GLYCOLAX ) packet Take 4.25 g by mouth daily.      rOPINIRole  (REQUIP ) 1 MG tablet Take 1 tablet (1 mg total) by mouth daily. 30 tablet 0   sodium chloride  (OCEAN) 0.65 % SOLN nasal spray Place 1 spray into both nostrils as needed for congestion.     topiramate  (TOPAMAX ) 100 MG tablet Take 1 tablet by mouth twice daily 60 tablet 2   vitamin E  400 UNIT capsule Take 400 Units by mouth at bedtime.     topiramate   (TOPAMAX ) 25 MG tablet Take 1 tablet by mouth twice daily (Patient taking differently: Take 25 mg by mouth at bedtime.) 60 tablet 2   potassium chloride  SA (KLOR-CON  M) 20 MEQ tablet Take 1 tablet (20 mEq total) by mouth daily for 14 days. 14 tablet 0   No facility-administered medications prior to visit.    PAST MEDICAL HISTORY: Past Medical History:  Diagnosis Date   Anxiety    Arthritis  Arthritis of neck    DVT (deep venous thrombosis) (HCC) 2008   L leg   Finger fracture, right    small   Major depressive disorder    pt denies   Migraine    Seizure disorder [facility])    since age 82-3, last sz 12/31/15   Thyroid disease    Wears partial dentures     PAST SURGICAL HISTORY: Past Surgical History:  Procedure Laterality Date   BIOPSY  10/17/2018   Procedure: BIOPSY;  Surgeon: Baldo Bonds, MD;  Location: [facility];  Service: Endoscopy;;   BREAST EXCISIONAL BIOPSY Bilateral    1990s   COLONOSCOPY WITH PROPOFOL  N/A 10/17/2018   Procedure: COLONOSCOPY WITH PROPOFOL ;  Surgeon: Baldo Bonds, MD;  Location: [facility];  Service: Endoscopy;  Laterality: N/A;   ESOPHAGOGASTRODUODENOSCOPY (EGD) WITH PROPOFOL  N/A 10/17/2018   Procedure: ESOPHAGOGASTRODUODENOSCOPY (EGD) WITH PROPOFOL ;  Surgeon: Baldo Bonds, MD;  Location: [facility];  Service: Endoscopy;  Laterality: N/A;   NASAL SINUS SURGERY     POLYPECTOMY  10/17/2018   Procedure: POLYPECTOMY;  Surgeon: Baldo Bonds, MD;  Location: [facility];  Service: Endoscopy;;   TONSILLECTOMY     TUBAL LIGATION      FAMILY HISTORY: Family History  Problem Relation Age of Onset   Stroke Father    Heart attack Father    Throat cancer Sister    Seizures Sister    Migraines Sister    Diabetes Maternal Grandmother    Diabetes Maternal Grandfather    Kidney Stones Sister     SOCIAL HISTORY:  Social History   Socioeconomic History   Marital status: Divorced    Spouse name: Not on file   Number of  children: 2   Years of education: 12th   Highest education level: Not on file  Occupational History   Occupation: disabled  Tobacco Use   Smoking status: Former    Current packs/day: 0.00    Types: Cigarettes    Quit date: 04/16/1998    Years since quitting: 25.3   Smokeless tobacco: Never  Vaping Use   Vaping status: Never Used  Substance and Sexual Activity   Alcohol use: Not Currently   Drug use: Not Currently   Sexual activity: Not Currently    Birth control/protection: Post-menopausal  Other Topics Concern   Not on file  Social History Narrative   Patient lives at home alone.Caffeine Use: 1 cup of coffee daily   Right handed   Social Drivers of Corporate investment banker Strain: Not on file  Food Insecurity: Not on file  Transportation Needs: Not on file  Physical Activity: Not on file  Stress: Not on file  Social Connections: Unknown (08/26/2021)   [facility]   Social Network    Social Network: Not on file  Intimate Partner Violence: Unknown (07/18/2021)   Received from [facility], [facility]   HITS    Physically Hurt: Not on file    Insult or Talk Down To: Not on file    Threaten Physical Harm: Not on file    Scream or Curse: Not on file     PHYSICAL EXAM  GENERAL EXAM/CONSTITUTIONAL: Vitals:  Vitals:   08/20/23 1120  BP: 122/74  Weight: 131 lb (59.4 kg)  Height: 5\' 4"  (1.626 m)    Body mass index is 22.49 kg/m. No results found. Patient is in no distress; well developed, nourished and groomed; neck is supple MASKED FACIES  CARDIOVASCULAR: Examination of carotid  arteries is normal; no carotid bruits Regular rate and rhythm, no murmurs Examination of peripheral vascular system by observation and palpation is normal  EYES: Ophthalmoscopic exam of optic discs and posterior segments is normal; no papilledema or hemorrhages  MUSCULOSKELETAL: Gait, strength, tone, movements noted in Neurologic exam  below  NEUROLOGIC: MENTAL STATUS:     05/12/2021    4:15 PM  MMSE - Mini Mental State Exam  Orientation to time 5  Orientation to Place 5  Registration 3  Attention/ Calculation 2  Recall 3  Language- name 2 objects 2  Language- repeat 1  Language- follow 3 step command 3  Language- read & follow direction 1  Write a sentence 1  Copy design 1  Total score 27   awake, alert, oriented to person, place and time recent and remote memory intact normal attention and concentration language fluent, comprehension intact, naming intact fund of knowledge appropriate  CRANIAL NERVE:  2nd - no papilledema on fundoscopic exam 2nd, 3rd, 4th, 6th - pupils equal and reactive to light, visual fields full to confrontation, extraocular muscles intact, no nystagmus 5th - facial sensation symmetric 7th - facial strength symmetric 8th - hearing intact 9th - palate elevates symmetrically, uvula midline 11th - shoulder shrug symmetric 12th - tongue protrusion midline  MOTOR:  normal bulk and tone, full strength in the BUE, BLE TRIGGER FINGER IN RIGHT HAND DIGIT 5  SENSORY:  normal and symmetric to light touch  COORDINATION:  finger-nose-finger, fine finger movements normal  REFLEXES:  deep tendon reflexes TRACE and symmetric  GAIT/STATION:  narrow based gait    DIAGNOSTIC DATA (LABS, IMAGING, TESTING) - I reviewed patient records, labs, notes, testing and imaging myself where available.  Lab Results  Component Value Date   WBC 8.4 02/04/2023   HGB 10.0 (L) 02/04/2023   HCT 31.5 (L) 02/04/2023   MCV 93.2 02/04/2023   PLT 240 02/04/2023      Component Value Date/Time   NA 139 02/04/2023 1221   K 3.3 (L) 02/04/2023 1221   CL 107 02/04/2023 1221   CO2 21 (L) 02/04/2023 1221   GLUCOSE 151 (H) 02/04/2023 1221   BUN 20 02/04/2023 1221   CREATININE 0.86 02/04/2023 1221   CALCIUM  9.0 02/04/2023 1221   PROT 6.5 02/04/2023 1221   ALBUMIN 3.2 (L) 02/04/2023 1221   AST 30  02/04/2023 1221   ALT 29 02/04/2023 1221   ALKPHOS 45 02/04/2023 1221   BILITOT 0.3 02/04/2023 1221   GFRNONAA >60 02/04/2023 1221   GFRAA >60 07/21/2019 0932   No results found for: "CHOL", "HDL", "LDLCALC", "LDLDIRECT", "TRIG", "CHOLHDL" No results found for: "HGBA1C" No results found for: "VITAMINB12" No results found for: "TSH"  06/03/14 CT head [I reviewed images myself and agree with interpretation. -VRP]  - normal   05/24/13 MRI brain [I reviewed images myself and agree with interpretation. -VRP]  1.  No acute intracranial abnormality. 2. Volume loss and signal changes in the left mesial temporal lobe compatible with mesial temporal sclerosis. 3. Otherwise negative MRI appearance of the brain.    ASSESSMENT AND PLAN  73 y.o. year old female here with history of seizure disorder since age 40-56 years old, diagnosed at [facility] in June 2015 with frontal lobe epilepsy through VEEG. Last seizure on June 2015 (during [facility] video EEG). Also with atypical event on 12/31/15 (syncope vs seizure). Also with some intermixed non-epileptic spells. Also with stress / anxiety issues.   New spells in June 2019,  not clearly epileptic seizures.    Dx:  Numbness of left foot  Chronic pain of left knee    PLAN:   LEFT FOOT NUMBNESS (since ~ 2005; some tendonitis, metatarsalgia, tarsal tunnel syndrome diagnosed and managed per podiatry in past; now resolved) - continue conservative mgmt; no further neurologic testing recommended; consider PT / sports med eval for left knee issues  SEIZURE DISORDER (gutteral sounds, slow response, frontal lobe localization; last seizure 2015) + non-epileptic spells (oral dyskinetic activity, last events 2017 and 2019) - continue vimpat  200mg  twice a day  - continue topiramate ; reduce to 100mg  twice a day; may continue to reduce over time due to memory complaints  RESTLESS LEG SYNDROME - continue ropinirole  (1mg  in AM, 2mg  in PM) for  RLS  ANXIETY - follow up with psychiatry / psychology for stress/anxiety issues - no more diazepam  refills from here (only per psychiatry)  Return in about 1 year (around 08/19/2024) for MyChart visit (15 min).      Omega Bible, MD 08/20/2023, 12:07 PM Certified in Neurology, Neurophysiology and Neuroimaging  [facility] [address] [place], [place] [address] [phone]

## 2023-08-24 ENCOUNTER — Telehealth: Payer: Self-pay | Admitting: Diagnostic Neuroimaging

## 2023-08-24 NOTE — Telephone Encounter (Signed)
 Pt failed to mention during her recent appointment that she would like Dr Salli Crawley to fill her diazepam  (VALIUM ) 10 MG tablet again, she would like a refill called into [facility] [phone]

## 2023-09-07 ENCOUNTER — Ambulatory Visit (INDEPENDENT_AMBULATORY_CARE_PROVIDER_SITE_OTHER)

## 2023-09-07 ENCOUNTER — Ambulatory Visit (INDEPENDENT_AMBULATORY_CARE_PROVIDER_SITE_OTHER): Admitting: Podiatry

## 2023-09-07 ENCOUNTER — Encounter: Payer: Self-pay | Admitting: Podiatry

## 2023-09-07 VITALS — Ht 64.0 in | Wt 131.0 lb

## 2023-09-07 DIAGNOSIS — G5762 Lesion of plantar nerve, left lower limb: Secondary | ICD-10-CM | POA: Diagnosis not present

## 2023-09-07 DIAGNOSIS — L600 Ingrowing nail: Secondary | ICD-10-CM

## 2023-09-07 DIAGNOSIS — M79675 Pain in left toe(s): Secondary | ICD-10-CM | POA: Diagnosis not present

## 2023-09-07 DIAGNOSIS — M2242 Chondromalacia patellae, left knee: Secondary | ICD-10-CM

## 2023-09-07 DIAGNOSIS — G8929 Other chronic pain: Secondary | ICD-10-CM

## 2023-09-07 NOTE — Patient Instructions (Signed)
  VISIT SUMMARY: Today, you were seen for sudden sharp pain in your third toe, new knee pain, and a past ingrown toenail issue. We discussed your symptoms and possible causes, and I provided recommendations for managing each condition.  YOUR PLAN: -MILD ARTHRITIS IN LEFT TOE: You have mild arthritis in the joints of your left toe, which can cause sudden, sharp pain. Arthritis is a condition where the joints become inflamed, leading to pain and stiffness. We will monitor your symptoms, and if the pain becomes more consistent or frequent, we may consider injection treatments. No imaging is needed at this time.  This could also be a Morton's neuroma which is inflammation of the nerve between the toes  -INGROWN TOENAIL WITHOUT INFECTION: You have a mild ingrown toenail on the left side without any signs of infection. An ingrown toenail occurs when the edge of the nail grows into the skin. Since there is no pain or infection, we will simply monitor it for any changes.  -KNEE PAIN WITH POSSIBLE ARTHRITIS: You have knee pain under the patella, which may be due to arthritis. Arthritis in the knee can cause a grinding sensation and discomfort due to the wear and tear of cartilage. I am referring you to an orthopedic surgeon for further evaluation and management of your knee pain.  INSTRUCTIONS: Please monitor your left toe for any changes in pain and report if it becomes more consistent or frequent. Watch for any signs of pain or infection in your ingrown toenail. Follow up with the orthopedic surgeon for your knee pain as soon as possible.                      Contains text generated by Abridge.                                 Contains text generated by Abridge.

## 2023-09-07 NOTE — Progress Notes (Signed)
 Subjective:  Patient ID: Teresa Nguyen, female    DOB: [birth year],  MRN: [id number]  Chief Complaint  Patient presents with   Foot Pain    Left foot 3rd toe pain    Discussed the use of AI scribe software for clinical note transcription with the patient, who gave verbal consent to proceed.  History of Present Illness Teresa Nguyen is a 73 year old female who presents with sudden sharp pain in her third toe.  She experiences a sudden, sharp pain in her third toe, described as feeling like 'lightning' going through it. This occurred once and was unexpected, catching her 'totally off guard'. The pain is localized to the toe and does not radiate. No associated numbness or injuries to the toe.  She has a history of tingling in her feet, which has since resolved. She is aware of pressure on a specific bone in her foot when walking.  She reports new onset of knee pain, specifically under the patella, describing it as a grinding sensation with a feeling of a crack. She has not previously consulted an orthopedic surgeon for this issue.  She mentions a past ingrown nail where the cuticle did not grow back, but it is not currently causing pain or infection.  No numbness in the toes and no pain upon manipulation of the affected area.      Objective:    Physical Exam VASCULAR: DP and PT pulse palpable. Foot is warm and well-perfused. Capillary fill time is brisk. DERMATOLOGIC: Normal skin turgor, texture, and temperature. No open lesions, rashes, or ulcerations. Mild ingrowing medial hallux border on the left side without infection,  NEUROLOGIC: Normal sensation to light touch and pressure. No paresthesias on examination. ORTHOPEDIC: Smooth pain-free range of motion of all examined joints. No ecchymosis or bruising. No gross deformity. No pain to palpation. Diffuse mild tenderness in the left foot. No tenderness or reproducible click in the second or third interspace on the left  foot.   No images are attached to the encounter.    Results RADIOLOGY Left foot radiographs: Mild arthritic changes in the interphalangeal joints. No fracture or stress fracture. (09/07/2023)   Assessment:   1. Morton's neuroma of left foot   2. Chondromalacia patellae of left knee   3. Ingrowing left great toenail      Plan:  Patient was evaluated and treated and all questions answered.  Assessment and Plan Assessment & Plan Mild arthritis in left toe Mild arthritis in the interphalangeal joints of the left toe, presenting with sudden, sharp pain described as a lightning sensation. No numbness or reproducible pain upon examination. Differential diagnosis includes a possible neuroma. Imaging such as ultrasound or MRI is not indicated unless considering surgical options, which is rare. - Monitor symptoms and report if she becomes more consistent or frequent for potential injection treatment. - Provide information on the condition.  Ingrown toenail without infection Mild ingrowing of the medial hallux border on the right side without infection. The cuticle has not regrown, possibly due to scar tissue formation. As long as there is no pain or infection, it is not a concern. - Monitor for any signs of pain or infection.  Knee pain with possible arthritis Pain under the patella, described as a grinding sensation with a possible crack. Suspected arthritis under the patella due to cartilage wear, common with age and activity history. She has not seen an orthopedic surgeon before. - Refer to orthopedic surgeon for evaluation of knee pain and  possible arthritis.      No follow-ups on file.

## 2023-09-15 ENCOUNTER — Encounter: Payer: Self-pay | Admitting: Orthopaedic Surgery

## 2023-09-15 ENCOUNTER — Ambulatory Visit (INDEPENDENT_AMBULATORY_CARE_PROVIDER_SITE_OTHER): Admitting: Orthopaedic Surgery

## 2023-09-15 ENCOUNTER — Other Ambulatory Visit (INDEPENDENT_AMBULATORY_CARE_PROVIDER_SITE_OTHER)

## 2023-09-15 DIAGNOSIS — M25562 Pain in left knee: Secondary | ICD-10-CM

## 2023-09-15 DIAGNOSIS — G8929 Other chronic pain: Secondary | ICD-10-CM

## 2023-09-15 NOTE — Progress Notes (Signed)
 Office Visit Note   Patient: Teresa Nguyen           Date of Birth: Oct 28, 1950           MRN: [id number] Visit Date: 09/15/2023              Requested by: Floyce Hutching, DPM [address] [place],  [place] [address] PCP: Merced Stair, NP   Assessment & Plan: Visit Diagnoses:  1. Chronic pain of left knee     Plan: History of Present Illness Teresa Nguyen is a 73 year old female with a history of seizures and sciatica who presents with left knee pain.  She experiences pain underneath the left patella without significant swelling or effusion. The pain occasionally causes mild locking but does not severely lock the knee. She rates the pain as not severe and manages it with Voltaren gel. There is mild tenderness on the medial side of the retinaculum. She has a history of multiple falls due to seizures, which have contributed to her knee pain. She also experiences sciatica and generalized pain.  Physical Exam MUSCULOSKELETAL: No effusion in the left knee joint, excellent flexibility, patella tracks well. Collateral ligaments of the left knee are solid. No tenderness along the left knee joint line. Mild tenderness on the medial side of the left knee retinaculum.  Assessment and Plan Left knee pain with mild patellofemoral arthritis - Provide home exercises to strengthen periarticular muscles. - Advise NSAIDs such as ibuprofen or naproxen for analgesia. - Recommend topical diclofenac gel as needed. - Discuss potential for corticosteroid or hyaluronic acid injections if pain exacerbates. - Reassure knee arthroplasty unlikely.  Follow-Up Instructions: No follow-ups on file.   Orders:  Orders Placed This Encounter  Procedures   XR KNEE 3 VIEW LEFT   No orders of the defined types were placed in this encounter.   Subjective: Chief Complaint  Patient presents with   Left Knee - Pain    HPI  Review of Systems  Constitutional: Negative.   HENT: Negative.    Eyes:  Negative.   Respiratory: Negative.    Cardiovascular: Negative.   Endocrine: Negative.   Musculoskeletal: Negative.   Neurological: Negative.   Hematological: Negative.   Psychiatric/Behavioral: Negative.    All other systems reviewed and are negative.    Objective: Vital Signs: There were no vitals taken for this visit.  Physical Exam Vitals and nursing note reviewed.  Constitutional:      Appearance: She is well-developed.  HENT:     Head: Atraumatic.     Nose: Nose normal.  Eyes:     Extraocular Movements: Extraocular movements intact.  Cardiovascular:     Pulses: Normal pulses.  Pulmonary:     Effort: Pulmonary effort is normal.  Abdominal:     Palpations: Abdomen is soft.  Musculoskeletal:     Cervical back: Neck supple.  Skin:    General: Skin is warm.     Capillary Refill: Capillary refill takes less than 2 seconds.  Neurological:     Mental Status: She is alert. Mental status is at baseline.  Psychiatric:        Behavior: Behavior normal.        Thought Content: Thought content normal.        Judgment: Judgment normal.      Imaging: XR KNEE 3 VIEW LEFT Result Date: 09/15/2023 X-rays of the left knee show mild osteoarthritis with osteophytic spurring of the patella.  PMFS History: Patient Active Problem List   Diagnosis Date Noted   Abnormal loss of weight 10/17/2018   GERD (gastroesophageal reflux disease) 10/17/2018   Change in stool 10/17/2018   Seizure (HCC) 12/27/2017   Frontal lobe epilepsy (HCC) 01/01/2014   Complex partial epilepsy (HCC) 12/29/2013   Focal epilepsy with impairment of consciousness (HCC) 05/09/2013   Migraine 02/21/2013   Right lower quadrant pain 02/21/2013   Hypertension 02/21/2013   Stress at home 02/21/2013   Restless leg syndrome 12/28/2012   Convulsion, non-epileptic (HCC) 12/28/2012   Past Medical History:  Diagnosis Date   Anxiety    Arthritis    Arthritis of neck    DVT (deep venous thrombosis) (HCC)  2008   L leg   Finger fracture, right    small   Major depressive disorder    pt denies   Migraine    Seizure disorder (HCC)    since age 47-3, last sz 12/31/15   Thyroid disease    Wears partial dentures     Family History  Problem Relation Age of Onset   Stroke Father    Heart attack Father    Throat cancer Sister    Seizures Sister    Migraines Sister    Diabetes Maternal Grandmother    Diabetes Maternal Grandfather    Kidney Stones Sister     Past Surgical History:  Procedure Laterality Date   BIOPSY  10/17/2018   Procedure: BIOPSY;  Surgeon: Baldo Bonds, MD;  Location: [facility];  Service: Endoscopy;;   BREAST EXCISIONAL BIOPSY Bilateral    1990s   COLONOSCOPY WITH PROPOFOL  N/A 10/17/2018   Procedure: COLONOSCOPY WITH PROPOFOL ;  Surgeon: Baldo Bonds, MD;  Location: [facility];  Service: Endoscopy;  Laterality: N/A;   ESOPHAGOGASTRODUODENOSCOPY (EGD) WITH PROPOFOL  N/A 10/17/2018   Procedure: ESOPHAGOGASTRODUODENOSCOPY (EGD) WITH PROPOFOL ;  Surgeon: Baldo Bonds, MD;  Location: [facility];  Service: Endoscopy;  Laterality: N/A;   NASAL SINUS SURGERY     POLYPECTOMY  10/17/2018   Procedure: POLYPECTOMY;  Surgeon: Baldo Bonds, MD;  Location: [facility];  Service: Endoscopy;;   TONSILLECTOMY     TUBAL LIGATION     Social History   Occupational History   Occupation: disabled  Tobacco Use   Smoking status: Former    Current packs/day: 0.00    Types: Cigarettes    Quit date: 04/16/1998    Years since quitting: 25.4   Smokeless tobacco: Never  Vaping Use   Vaping status: Never Used  Substance and Sexual Activity   Alcohol use: Not Currently   Drug use: Not Currently   Sexual activity: Not Currently    Birth control/protection: Post-menopausal

## 2023-10-02 ENCOUNTER — Other Ambulatory Visit: Payer: Self-pay | Admitting: Neurology

## 2023-10-02 DIAGNOSIS — G40219 Localization-related (focal) (partial) symptomatic epilepsy and epileptic syndromes with complex partial seizures, intractable, without status epilepticus: Secondary | ICD-10-CM

## 2023-10-02 MED ORDER — TOPIRAMATE 100 MG PO TABS
100.0000 mg | ORAL_TABLET | Freq: Two times a day (BID) | ORAL | 0 refills | Status: DC
Start: 2023-10-02 — End: 2023-10-21

## 2023-10-03 ENCOUNTER — Other Ambulatory Visit: Payer: Self-pay | Admitting: Neurology

## 2023-10-03 DIAGNOSIS — G40219 Localization-related (focal) (partial) symptomatic epilepsy and epileptic syndromes with complex partial seizures, intractable, without status epilepticus: Secondary | ICD-10-CM

## 2023-10-03 MED ORDER — LACOSAMIDE 200 MG PO TABS: 200.0000 mg | ORAL_TABLET | Freq: Two times a day (BID) | ORAL | 0 refills | Status: DC

## 2023-10-08 ENCOUNTER — Other Ambulatory Visit: Payer: Self-pay | Admitting: Diagnostic Neuroimaging

## 2023-10-08 DIAGNOSIS — G40219 Localization-related (focal) (partial) symptomatic epilepsy and epileptic syndromes with complex partial seizures, intractable, without status epilepticus: Secondary | ICD-10-CM

## 2023-10-21 ENCOUNTER — Telehealth: Payer: Self-pay | Admitting: Diagnostic Neuroimaging

## 2023-10-21 DIAGNOSIS — G40219 Localization-related (focal) (partial) symptomatic epilepsy and epileptic syndromes with complex partial seizures, intractable, without status epilepticus: Secondary | ICD-10-CM

## 2023-10-21 MED ORDER — TOPIRAMATE 100 MG PO TABS: 100.0000 mg | ORAL_TABLET | Freq: Two times a day (BID) | ORAL | 0 refills | Status: DC

## 2023-10-21 NOTE — Telephone Encounter (Signed)
 Yolanda at [facility] called requesting a refill for the pt's topiramate  (TOPAMAX ) 100 MG tablet

## 2023-10-21 NOTE — Telephone Encounter (Signed)
 Refill sent to [facility]

## 2023-12-30 ENCOUNTER — Emergency Department (HOSPITAL_BASED_OUTPATIENT_CLINIC_OR_DEPARTMENT_OTHER): Admitting: Radiology

## 2023-12-30 ENCOUNTER — Emergency Department (HOSPITAL_BASED_OUTPATIENT_CLINIC_OR_DEPARTMENT_OTHER)

## 2023-12-30 ENCOUNTER — Emergency Department (HOSPITAL_BASED_OUTPATIENT_CLINIC_OR_DEPARTMENT_OTHER)
Admission: EM | Admit: 2023-12-30 | Discharge: 2023-12-30 | Disposition: A | Discharge status: Home / Self Care | Source: Ambulatory Visit | Attending: Emergency Medicine | Admitting: Emergency Medicine

## 2023-12-30 ENCOUNTER — Other Ambulatory Visit: Payer: Self-pay

## 2023-12-30 ENCOUNTER — Encounter (HOSPITAL_BASED_OUTPATIENT_CLINIC_OR_DEPARTMENT_OTHER): Payer: Self-pay | Admitting: Emergency Medicine

## 2023-12-30 DIAGNOSIS — I10 Essential (primary) hypertension: Secondary | ICD-10-CM | POA: Diagnosis not present

## 2023-12-30 DIAGNOSIS — D72819 Decreased white blood cell count, unspecified: Secondary | ICD-10-CM | POA: Diagnosis not present

## 2023-12-30 DIAGNOSIS — R040 Epistaxis: Secondary | ICD-10-CM | POA: Diagnosis present

## 2023-12-30 DIAGNOSIS — Z79899 Other long term (current) drug therapy: Secondary | ICD-10-CM | POA: Diagnosis not present

## 2023-12-30 DIAGNOSIS — Z91010 Allergy to peanuts: Secondary | ICD-10-CM | POA: Diagnosis not present

## 2023-12-30 DIAGNOSIS — D649 Anemia, unspecified: Secondary | ICD-10-CM | POA: Insufficient documentation

## 2023-12-30 LAB — LIPASE, BLOOD: Lipase: 38 U/L (ref 11–51)

## 2023-12-30 LAB — URINALYSIS, ROUTINE W REFLEX MICROSCOPIC
Bilirubin Urine: NEGATIVE
Glucose, UA: NEGATIVE mg/dL
Hgb urine dipstick: NEGATIVE
Ketones, ur: NEGATIVE mg/dL
Nitrite: NEGATIVE
Protein, ur: NEGATIVE mg/dL
Specific Gravity, Urine: 1.019 (ref 1.005–1.030)
pH: 5.5 (ref 5.0–8.0)

## 2023-12-30 LAB — COMPREHENSIVE METABOLIC PANEL WITH GFR
ALT: 13 U/L (ref 0–44)
AST: 23 U/L (ref 15–41)
Albumin: 4.7 g/dL (ref 3.5–5.0)
Alkaline Phosphatase: 59 U/L (ref 38–126)
Anion gap: 12 (ref 5–15)
BUN: 19 mg/dL (ref 8–23)
CO2: 24 mmol/L (ref 22–32)
Calcium: 10.1 mg/dL (ref 8.9–10.3)
Chloride: 109 mmol/L (ref 98–111)
Creatinine, Ser: 0.91 mg/dL (ref 0.44–1.00)
GFR, Estimated: >60 mL/min (ref >60)
Glucose, Bld: 105 mg/dL — ABNORMAL HIGH (ref 70–99)
Potassium: 4.3 mmol/L (ref 3.5–5.1)
Sodium: 144 mmol/L (ref 135–145)
Total Bilirubin: 0.4 mg/dL (ref 0.0–1.2)
Total Protein: 7 g/dL (ref 6.5–8.1)

## 2023-12-30 LAB — CBC
HCT: 36 % (ref 36.0–46.0)
Hemoglobin: 11.6 g/dL — ABNORMAL LOW (ref 12.0–15.0)
MCH: 29.8 pg (ref 26.0–34.0)
MCHC: 32.2 g/dL (ref 30.0–36.0)
MCV: 92.5 fL (ref 80.0–100.0)
Platelets: 187 K/uL (ref 150–400)
RBC: 3.89 MIL/uL (ref 3.87–5.11)
RDW: 12.7 % (ref 11.5–15.5)
WBC: 3.2 K/uL — ABNORMAL LOW (ref 4.0–10.5)
nRBC: 0 % (ref 0.0–0.2)

## 2023-12-30 LAB — D-DIMER, QUANTITATIVE: D-Dimer, Quant: <0.27 ug{FEU}/mL (ref 0.00–0.50)

## 2023-12-30 NOTE — ED Triage Notes (Signed)
 Pt via pov from home with hemoptysis today. She reports that she had surgery 15-20 years ago on her sinuses and it went bad. She has had multiple problems since then. She was told that coughing up blood and ulcers were possible problems that could follow and that they could end in death. Pt coughing today and coughed up a clot and some liquid blood, which she brought with her today. Pt states this has never happened before and is concerned that his may be a sign of a big problem. Pt a&o x 4; nad noted. Pt tearful during triage.

## 2023-12-30 NOTE — ED Provider Notes (Signed)
 [place] [facility]MENT AT [facility] Provider Note   CSN: [id number] Arrival date & time: 12/30/23  1120     History Chief Complaint  Patient presents with   Hemoptysis    HPI: Teresa Nguyen is a 73 y.o. female with history pertinent remote sinus surgery 20 years ago, epileptic seizures, nonepileptic spells, prior DVT not on anticoagulation, GERD, migraines, hypertension who presents complaining of hemoptysis. Patient arrived via POV.  History provided by patient.  No interpreter required during this encounter.  Patient reports that recently she is been tasting blood running down the back of her throat.  Attributes this to a sinus surgery that went terribly wrong approximately 20 years ago.  Reports that as a side effect of the surgery she could develop hemoptysis, however reports that this is the first time that it is occurred in the approximately 20 years since the surgery.  Reports that she coughed up a clot as well as some red blood today.  Denies any chest pain, shortness of breath, nausea, vomiting, diarrhea, abdominal pain, fevers, chills.  Denies anticoagulant use.  Patient's recorded medical, surgical, social, medication list and allergies were reviewed in the Snapshot window as part of the initial history.   Prior to Admission medications   Medication Sig Start Date End Date Taking? Authorizing Provider  acetaminophen  (TYLENOL ) 500 MG tablet Take 500 mg by mouth 2 (two) times daily as needed for moderate pain or headache.    [provider]  amLODipine  (NORVASC ) 5 MG tablet Take 1 tablet (5 mg total) by mouth daily. 12/29/17   Rosario Leatrice FERNS, MD  atorvastatin  (LIPITOR) 10 MG tablet Take 10 mg by mouth at bedtime.  05/04/13   [provider]  Calcium  Carbonate-Vitamin D (CALCIUM -D PO) Take 2 tablets by mouth daily.     [provider]  Carboxymethylcellulose Sodium (REFRESH LIQUIGEL OP) Place 1 drop into both eyes daily as  needed (dry eyes).    [provider]  cycloSPORINE (RESTASIS) 0.05 % ophthalmic emulsion 1 drop 2 (two) times daily. 04/20/21   [provider]  diazepam  (VALIUM ) 10 MG tablet Take 10 mg by mouth daily as needed (seizure).    [provider]  diclofenac sodium (VOLTAREN) 1 % GEL Apply 1 application topically at bedtime. 02/07/18   [provider]  fluticasone (FLONASE) 50 MCG/ACT nasal spray Place 2 sprays into both nostrils at bedtime as needed for allergies.     [provider]  lacosamide  (VIMPAT ) 200 MG TABS tablet Take 1 tablet (200 mg total) by mouth 2 (two) times daily. 10/03/23   Camara, Amadou, MD  levothyroxine  (SYNTHROID , LEVOTHROID) 25 MCG tablet Take 25 mcg by mouth daily before breakfast.  05/04/13   [provider]  lidocaine -prilocaine  (EMLA ) cream Apply 1 Application topically as needed. 05/28/23   McDonald, Juliene JONELLE, DPM  loperamide (IMODIUM A-D) 2 MG tablet Take 1 mg by mouth 4 (four) times daily as needed for diarrhea or loose stools.    [provider]  LORazepam  (ATIVAN ) 1 MG tablet Take 1 tablet (1 mg total) by mouth 2 (two) times daily as needed for anxiety. 06/12/21   Armenta Canning, MD  meclizine  (ANTIVERT ) 25 MG tablet Take 1 tablet (25 mg total) by mouth 3 (three) times daily as needed for dizziness. 02/04/23   Long, Joshua G, MD  meloxicam (MOBIC) 7.5 MG tablet  07/28/19   [provider]  methocarbamol  (ROBAXIN ) 750 MG tablet Take 1 tablet (750 mg total)  by mouth 2 (two) times daily as needed for muscle spasms. Patient taking differently: Take 750 mg by mouth at bedtime. 12/28/17   Rosario Leatrice FERNS, MD  Multiple Vitamins-Minerals (MULTIVITAMIN ADULT) TABS Take 1 tablet by mouth daily.    [provider]  Omega-3 Fatty Acids (FISH OIL) 1000 MG CAPS Take 1,000 mg by mouth in the morning and at bedtime.    [provider]  oxyCODONE -acetaminophen  (PERCOCET/ROXICET) 5-325 MG tablet Take 1-2  tablets by mouth every 6 (six) hours as needed for severe pain. 12/02/22   Jerrol Agent, MD  polyethylene glycol (MIRALAX  / GLYCOLAX ) packet Take 4.25 g by mouth daily.     [provider]  potassium chloride  SA (KLOR-CON  M) 20 MEQ tablet Take 1 tablet (20 mEq total) by mouth daily for 14 days. 05/16/22 05/30/22  Trine Raynell Moder, MD  rOPINIRole  (REQUIP ) 1 MG tablet Take 1 tablet (1 mg total) by mouth daily. 12/29/17   Rosario Leatrice FERNS, MD  sodium chloride  (OCEAN) 0.65 % SOLN nasal spray Place 1 spray into both nostrils as needed for congestion.    [provider]  topiramate  (TOPAMAX ) 100 MG tablet Take 1 tablet (100 mg total) by mouth 2 (two) times daily. 10/21/23   Penumalli, Vikram R, MD  vitamin E  400 UNIT capsule Take 400 Units by mouth at bedtime.    [provider]     Allergies: Peanut oil, Nickel, Other, and Sulfa antibiotics   Review of Systems   ROS as per HPI  Physical Exam Updated Vital Signs BP 139/67   Pulse (!) 56   Temp 98.1 F (36.7 C) (Oral)   Resp 20   Ht 5' 4 (1.626 m)   Wt 59.4 kg   SpO2 100%   BMI 22.48 kg/m  Physical Exam Vitals and nursing note reviewed.  Constitutional:      General: She is not in acute distress.    Appearance: She is well-developed.  HENT:     Head: Normocephalic and atraumatic.     Nose: Nose normal.     Mouth/Throat:     Comments: No appreciable blood in the naso-oropharynx Eyes:     Conjunctiva/sclera: Conjunctivae normal.  Cardiovascular:     Rate and Rhythm: Normal rate and regular rhythm.     Heart sounds: No murmur heard. Pulmonary:     Effort: Pulmonary effort is normal. No respiratory distress.     Breath sounds: Normal breath sounds.  Abdominal:     Palpations: Abdomen is soft.     Tenderness: There is no abdominal tenderness.  Musculoskeletal:        General: No swelling.     Cervical back: Neck supple.  Skin:    General: Skin is warm and dry.     Capillary Refill: Capillary  refill takes less than 2 seconds.  Neurological:     Mental Status: She is alert.  Psychiatric:        Mood and Affect: Mood normal.     ED Course/ Medical Decision Making/ A&P    Procedures Procedures   Medications Ordered in ED Medications - No data to display  Medical Decision Making:   Teresa Nguyen is a 73 y.o. female who presents for hemoptysis as per above.  Physical exam is pertinent for no focal abnormality, no ongoing epistaxis.   The differential includes but is not limited to aspirated blood from epistaxis, PE, pneumonia, aspiration pneumonia, tuberculosis, hematemesis.  Independent historian: None  External data reviewed:  Labs: Reviewed prior labs for baseline  Labs: Ordered, Independent interpretation, and Details: UA equivocal for UTI with LE without nitrites, rare bacteria.  D-dimer undetectable.  Lipase WNL.  CMP without AKI, emergent electrolyte derangement, emergent LFT abnormality.  CBC with similar leukopenia and anemia on comparison to prior.  No thrombocytopenia.  Radiology: Ordered, Independent interpretation, Details: Chest x-ray without focal airspace opacification, cardiomediastinal silhouette derangement, pneumothorax, pleural effusion, bony derangement, and All images reviewed independently.  Agree with radiology report at this time.   DG Chest 2 View Result Date: 12/30/2023 CLINICAL DATA:  Hemoptysis. EXAM: CHEST - 2 VIEW COMPARISON:  01/24/2023. FINDINGS: Bilateral lung fields are clear. Bilateral costophrenic angles are clear. Normal cardio-mediastinal silhouette. No acute osseous abnormalities. The soft tissues are within normal limits. IMPRESSION: No active cardiopulmonary disease. Electronically Signed   By: Ree Molt M.D.   On: 12/30/2023 13:15    EKG/Medicine tests: Not indicated EKG Interpretation:    Interventions: None  See the EMR for full details regarding lab and imaging results.  Patient overall well-appearing on exam.   Prior to my evaluation, labs are ordered per standing protocol in triage including lipase and UA, patient without any abdominal complaints, no dysuria, urgency, frequency, thus equivocal UA less likely UTI, antibiotics not indicated.  With regard to etiology of hemoptysis, given patient reports that she has frequent posterior drainage of blood from her nose related to her remote sinus surgery, suspect aspirated blood as most likely etiology.  Doubt hematemesis given patient denies any recent emesis.  Patient without any risk factors for tuberculosis.  Patient saturating well on room air, no focal lung findings, chest x-ray obtained and does not reveal evidence of pneumonia or aspiration pneumonia.  Did consider pulmonary embolism, however patient without any chest pain, shortness of breath, no lower extremity abnormalities, no signs or symptoms to suggest pulmonary embolism outside of hemoptysis which has other explanations such as aspirated blood.  D-dimer obtained and WNL, therefore do not feel the patient requires CTA.  Patient anemic, at baseline, no symptoms to suggest symptomatic anemia.  Feel that patient is stable for continued outpatient management.  Patient reports that she has not seen a ENT since her surgery and primarily follows with her PCP, encouraged further PCP follow-up, also will refer to ENT for evaluation of cauterization of nasal vessels given patient reports frequent posterior nasal bleeding, though none is evident on exam today.  Presentation is most consistent with acute complicated illness, Current presentation is complicated by underlying chronic conditions, and I did consider and rule out acute life/limb-threatening illness  Discussion of management or test interpretations with external provider(s): Not indicated  Risk Drugs:None  Disposition: DISCHARGE: I believe that the patient is safe for discharge home with outpatient follow-up. Patient was informed of all pertinent  physical exam, laboratory, and imaging findings.  Patient's suspected etiology of their symptom presentation was discussed with the patient and all questions were answered. We discussed following up with PCP and ENT. I provided thorough ED return precautions. The patient feels safe and comfortable with this plan.  MDM generated using voice dictation software and may contain dictation errors.  Please contact me for any clarification or with any questions.  Clinical Impression:  1. Epistaxis      Discharge   Final Clinical Impression(s) / ED Diagnoses Final diagnoses:  Epistaxis    Rx / DC Orders ED Discharge Orders          Ordered    Ambulatory referral to ENT  12/30/23 1450             Rogelia Jerilynn RAMAN, MD 12/31/23 1406

## 2023-12-30 NOTE — Discharge Instructions (Addendum)
 Teresa Nguyen  Thank you for allowing us  to take care of you today.  You came to the Emergency Department today because you frequently had blood draining down the back of your throat, and today you coughed up some blood and a blood clot.  Here in the emergency department your oxygen is normal, you do not have any abnormal lung sounds, your blood counts are normal, and your blood clot risk factor number is negative, therefore your coughed up blood is more likely from inhaled blood from your nosebleed rather than a problem with your lung.  Given you have a history of sinus abnormalities, we are giving you a referral to follow-up with a ear nose and throat doctor.  You should also follow-up with your primary care doctor.  To-Do: 1. Please follow-up with your primary doctor within 2 days / as soon as possible.   Please return to the Emergency Department or call 911 if you experience have worsening of your symptoms, or do not get better, persistent nosebleed that you are unable to control at home, chest pain, shortness of breath, severe or significantly worsening pain, high fever, severe confusion, pass out or have any reason to think that you need emergency medical care.   We hope you feel better soon.   Department of Emergency Medicine [facility]

## 2024-01-13 ENCOUNTER — Ambulatory Visit
Admission: RE | Admit: 2024-01-13 | Discharge: 2024-01-13 | Disposition: A | Discharge status: Home / Self Care | Source: Ambulatory Visit | Attending: Family Medicine | Admitting: Family Medicine

## 2024-01-13 ENCOUNTER — Other Ambulatory Visit: Payer: Self-pay | Admitting: Diagnostic Neuroimaging

## 2024-01-13 DIAGNOSIS — G40219 Localization-related (focal) (partial) symptomatic epilepsy and epileptic syndromes with complex partial seizures, intractable, without status epilepticus: Secondary | ICD-10-CM

## 2024-01-13 DIAGNOSIS — Z1211 Encounter for screening for malignant neoplasm of colon: Secondary | ICD-10-CM

## 2024-01-17 ENCOUNTER — Other Ambulatory Visit: Payer: Self-pay | Admitting: Podiatry

## 2024-01-17 DIAGNOSIS — M5417 Radiculopathy, lumbosacral region: Secondary | ICD-10-CM

## 2024-01-20 ENCOUNTER — Other Ambulatory Visit: Payer: Self-pay | Admitting: Diagnostic Neuroimaging

## 2024-01-20 DIAGNOSIS — G40219 Localization-related (focal) (partial) symptomatic epilepsy and epileptic syndromes with complex partial seizures, intractable, without status epilepticus: Secondary | ICD-10-CM

## 2024-01-20 NOTE — Telephone Encounter (Signed)
 Last filled by patient on 01/06/24 Last office visit : 08/20/23 Next office visit : 08/21/24

## 2024-01-24 ENCOUNTER — Telehealth: Payer: Self-pay | Admitting: Diagnostic Neuroimaging

## 2024-01-24 NOTE — Telephone Encounter (Signed)
 I called patient back. Having 3-4 hours of difficulty speaking and functioning.  Having some difficulty with breathing and focusing.  She was able to walk to her neighbors house and ask for some help (neighbors name is Inocente Furnace).  I spoke with patient and Ameria Sanjurjo over the phone today.  Patient also asking for additional diazepam  which she claims she is out of.  This was supposed to be prescribed by psychiatry.  I discussed option to go to the emergency room for evaluation this evening.  Other option is to follow-up in the clinic with us  and we may consider additional home EEG testing to rule out seizure versus nonepileptic spells (patient has both of these diagnoses for the past).  Also patient may want to follow-up with psychiatry in case this may represent a anxiety / stress reaction (which she has also had in the past).  Patient and friend will monitor symptoms for this evening and call us  or message us  in the morning.   EDUARD FABIENE HANLON, MD 01/24/2024, 7:35 PM Certified in Neurology, Neurophysiology and Neuroimaging  [facility] [address] [place], [place] [address] [phone]

## 2024-01-26 NOTE — Telephone Encounter (Signed)
 [facility] order form completed and is pending MD signature. Fax will be to [phone]

## 2024-01-26 NOTE — Telephone Encounter (Signed)
 Form has been signed by MD. Referral faxed to [facility]. Received a receipt of confirmation.  [phone]

## 2024-01-26 NOTE — Telephone Encounter (Signed)
 Received referral for patient from PCP at [facility] states needs repeat EEG. I reviewed previous phone note. She is currently scheduled for a f/u on 08/21/24-the soonest appt I can get her into is 04/17/24-would this be okay to move her up to to discuss symptoms and need for new EEG? OR do you want to go ahead and order this prior to a follow up?   Please advise, thank you!

## 2024-01-27 NOTE — Telephone Encounter (Signed)
 FYI - Spoke with patient - she is scheduled for a follow up with our office in January and advised that her 72 hour EEG has been scheduled for next week.

## 2024-01-29 ENCOUNTER — Emergency Department (HOSPITAL_COMMUNITY)

## 2024-01-29 ENCOUNTER — Other Ambulatory Visit: Payer: Self-pay

## 2024-01-29 ENCOUNTER — Emergency Department (HOSPITAL_COMMUNITY)
Admission: EM | Admit: 2024-01-29 | Discharge: 2024-01-29 | Disposition: A | Discharge status: Home / Self Care | Attending: Emergency Medicine | Admitting: Emergency Medicine

## 2024-01-29 ENCOUNTER — Encounter (HOSPITAL_COMMUNITY): Payer: Self-pay

## 2024-01-29 DIAGNOSIS — M542 Cervicalgia: Secondary | ICD-10-CM | POA: Insufficient documentation

## 2024-01-29 DIAGNOSIS — Z91040 Latex allergy status: Secondary | ICD-10-CM | POA: Diagnosis not present

## 2024-01-29 DIAGNOSIS — M25512 Pain in left shoulder: Secondary | ICD-10-CM | POA: Diagnosis not present

## 2024-01-29 DIAGNOSIS — I10 Essential (primary) hypertension: Secondary | ICD-10-CM | POA: Diagnosis not present

## 2024-01-29 DIAGNOSIS — Z79899 Other long term (current) drug therapy: Secondary | ICD-10-CM | POA: Insufficient documentation

## 2024-01-29 NOTE — Discharge Instructions (Addendum)
 Thank you for letting us  evaluate you today.  Your cervical CT of your neck did not show any acute fractures, obvious disc pathology, masses in your neck.  It does show arthritis in your neck as well as decreased disc size of C5-C7 (common with advanced age). You can put topical lidocaine  patches/ salonpas, voltaren gel, heat, ice on areas of pain.  I have sent Robaxin /muscle relaxer to your pharmacy to use to see if this helps with neck pain.  Return to Emergency Department if you experience sudden severe debilitating headache, numbness or weakness in one-sided arms, legs, inability to walk for/changes in your walk, slurred speech, difficulty getting speech out, facial droop.  Otherwise, follow-up with PCP

## 2024-01-29 NOTE — ED Triage Notes (Signed)
 C/o not being able to turn her neck , states she started with left shoulder pain last pm and this am she started having pain in her neck and states she can't turn her neck. Denies injury, or tingling in her hands or arms.

## 2024-01-29 NOTE — ED Provider Notes (Signed)
 [place] EMERGENCY DEPARTMENT AT [facility] Provider Note   CSN: [id number] Arrival date & time: 01/29/24  1206     Patient presents with: Neck Pain   Teresa Nguyen is a 73 y.o. female with past medical history of HTN, migraine, epilepsy (on Vimpat ), GERD, thyroid disease (on synthroid ), DVT presents to Emergency Department for evaluation of left shoulder and neck pain that started last night.  Reports that shoulder pain started first then resolved and she had left sided neck pain when she woke up this morning. Pain worsened with neck movement. Takes tylenol  and meloxicam at home for pain with little relief. She does not currently have neck pain but reports tingling along sternocleidomastoid muscle.   Sought ED evaluation as she was told in the past by a physician that she had degenerative bones in her C1 and C2 and would need to be checked if she ever had pain.  She also reports that her neck has been cracking for the past 18 months.  No history of IVDU nor malignancy.  She denies numbness, weakness in extremities, recent falls, visual disturbances, fevers.    Neck Pain      Prior to Admission medications   Medication Sig Start Date End Date Taking? Authorizing Provider  acetaminophen  (TYLENOL ) 500 MG tablet Take 500 mg by mouth 2 (two) times daily as needed for moderate pain or headache.    [provider]  amLODipine  (NORVASC ) 5 MG tablet Take 1 tablet (5 mg total) by mouth daily. 12/29/17   Rosario Leatrice FERNS, MD  atorvastatin  (LIPITOR) 10 MG tablet Take 10 mg by mouth at bedtime.  05/04/13   [provider]  Calcium  Carbonate-Vitamin D (CALCIUM -D PO) Take 2 tablets by mouth daily.     [provider]  Carboxymethylcellulose Sodium (REFRESH LIQUIGEL OP) Place 1 drop into both eyes daily as needed (dry eyes).    [provider]  cycloSPORINE (RESTASIS) 0.05 % ophthalmic emulsion 1 drop 2 (two) times daily. 04/20/21   [provider]  diazepam  (VALIUM ) 10 MG tablet Take 10 mg by mouth daily as needed (seizure).    [provider]  diclofenac sodium (VOLTAREN) 1 % GEL Apply 1 application topically at bedtime. 02/07/18   [provider]  fluticasone (FLONASE) 50 MCG/ACT nasal spray Place 2 sprays into both nostrils at bedtime as needed for allergies.     [provider]  lacosamide  (VIMPAT ) 200 MG TABS tablet Take 1 tablet by mouth twice daily 01/20/24   Penumalli, Vikram R, MD  levothyroxine  (SYNTHROID , LEVOTHROID) 25 MCG tablet Take 25 mcg by mouth daily before breakfast.  05/04/13   [provider]  lidocaine -prilocaine  (EMLA ) cream Apply to feet daily as needed for pain 01/17/24   McDonald, Adam R, DPM  loperamide (IMODIUM A-D) 2 MG tablet Take 1 mg by mouth 4 (four) times daily as needed for diarrhea or loose stools.    [provider]  LORazepam  (ATIVAN ) 1 MG tablet Take 1 tablet (1 mg total) by mouth 2 (two) times daily as needed for anxiety. 06/12/21   Armenta Canning, MD  meclizine  (ANTIVERT ) 25 MG tablet Take 1 tablet (25 mg total) by mouth 3 (three) times daily as needed for dizziness. 02/04/23   Long, Joshua G, MD  meloxicam (MOBIC) 7.5 MG tablet  07/28/19   [provider]  methocarbamol  (ROBAXIN ) 750 MG tablet Take 1 tablet (750 mg total) by mouth 2 (two) times daily as needed for muscle spasms.  Patient taking differently: Take 750 mg by mouth at bedtime. 12/28/17   Rosario Leatrice FERNS, MD  Multiple Vitamins-Minerals (MULTIVITAMIN ADULT) TABS Take 1 tablet by mouth daily.    [provider]  Omega-3 Fatty Acids (FISH OIL) 1000 MG CAPS Take 1,000 mg by mouth in the morning and at bedtime.    [provider]  oxyCODONE -acetaminophen  (PERCOCET/ROXICET) 5-325 MG tablet Take 1-2 tablets by mouth every 6 (six) hours as needed for severe pain. 12/02/22   Jerrol Agent, MD  polyethylene glycol (MIRALAX  / GLYCOLAX ) packet Take 4.25 g by mouth  daily.     [provider]  potassium chloride  SA (KLOR-CON  M) 20 MEQ tablet Take 1 tablet (20 mEq total) by mouth daily for 14 days. 05/16/22 05/30/22  Trine Raynell Moder, MD  rOPINIRole  (REQUIP ) 1 MG tablet Take 1 tablet (1 mg total) by mouth daily. 12/29/17   Rosario Leatrice FERNS, MD  sodium chloride  (OCEAN) 0.65 % SOLN nasal spray Place 1 spray into both nostrils as needed for congestion.    [provider]  topiramate  (TOPAMAX ) 100 MG tablet Take 1 tablet by mouth twice daily 01/17/24   Penumalli, Vikram R, MD  vitamin E  400 UNIT capsule Take 400 Units by mouth at bedtime.    [provider]    Allergies: Peanut oil, Nickel, Other, and Sulfa antibiotics    Review of Systems  Musculoskeletal:  Positive for neck pain.    Updated Vital Signs BP (!) 166/75 (BP Location: Right Arm)   Pulse 66   Temp 98.6 F (37 C)   Resp (!) 22   SpO2 100%   Physical Exam Vitals and nursing note reviewed.  Constitutional:      General: She is not in acute distress.    Appearance: Normal appearance.  HENT:     Head: Normocephalic and atraumatic.  Eyes:     General: No visual field deficit.    Conjunctiva/sclera: Conjunctivae normal.  Neck:     Comments: Points to sternocleidomastoid muscle as etiology of pain.  No muscular tenderness.  No cervical spine tenderness.  Neck flexion, extension, bilateral lateral flexion ROM WNL. No crepitus, masses, step off to cervical spine Cardiovascular:     Rate and Rhythm: Normal rate.  Pulmonary:     Effort: Pulmonary effort is normal. No respiratory distress.     Breath sounds: Normal breath sounds.  Musculoskeletal:     Cervical back: Full passive range of motion without pain and neck supple. No rigidity or crepitus. No spinous process tenderness or muscular tenderness. Normal range of motion.     Comments: No bony tenderness, swelling, erythema, warmth to left shoulder.  Shoulder flexion extension, abduction, adduction ROM WNL.   Well-perfused extremity with easily palpable radial pulse  Skin:    Coloration: Skin is not jaundiced or pale.  Neurological:     Mental Status: She is alert and oriented to person, place, and time. Mental status is at baseline.     Cranial Nerves: No cranial nerve deficit, dysarthria or facial asymmetry.     Sensory: No sensory deficit.     Motor: No weakness, tremor, abnormal muscle tone, seizure activity or pronator drift.     Coordination: Coordination is intact. Coordination normal.     Gait: Gait normal.     Comments: A&Ox3.  Motor 5/5 and sensation 2/2 of BUE and BLE.  No aphasia nor slurred speech.  No visual disturbances no complaints of dizziness.  No vertical nor horizontal nystagmus     (  all labs ordered are listed, but only abnormal results are displayed) Labs Reviewed - No data to display  EKG: None  Radiology: CT Cervical Spine Wo Contrast Result Date: 01/29/2024 CLINICAL DATA:  Chronic neck pain. EXAM: CT CERVICAL SPINE WITHOUT CONTRAST TECHNIQUE: Multidetector CT imaging of the cervical spine was performed without intravenous contrast. Multiplanar CT image reconstructions were also generated. RADIATION DOSE REDUCTION: This exam was performed according to the departmental dose-optimization program which includes automated exposure control, adjustment of the mA and/or kV according to patient size and/or use of iterative reconstruction technique. COMPARISON:  None Available. FINDINGS: Alignment: Normal. Skull base and vertebrae: No acute fracture. No primary bone lesion or focal pathologic process. Soft tissues and spinal canal: No prevertebral fluid or swelling. No visible canal hematoma. Disc levels: Loss of disc height with endplate degeneration noted C5-6 and C6-7. Bilateral facet osteoarthritis is noted in the upper cervical spine bilaterally with degenerative changes in the left-sided C7-T1 facets. Upper chest: No acute findings Other: None. IMPRESSION: 1. No acute  findings. 2. Degenerative changes in the cervical spine as above. Electronically Signed   By: Camellia Candle M.D.   On: 01/29/2024 13:46     Medications Ordered in the ED - No data to display                                  Medical Decision Making Risk Prescription drug management.   Patient presents to the ED for concern of left shoulder pain, neck pain, this involves an extensive number of treatment options, and is a complaint that carries with it a high risk of complications and morbidity.  The differential diagnosis includes fracture, contusion, dislocation, meningitis, epidural abscess, muscle strain   Co morbidities that complicate the patient evaluation  See HPI   Additional history obtained:  Additional history obtained from Nursing   External records from outside source obtained and reviewed including triage note    Imaging Studies ordered:  I ordered imaging studies including CT cervical spine I independently visualized and interpreted imaging which showed no acute findings I agree with the radiologist interpretation    Medicines ordered and prescription drug management:  I ordered medication including Robaxin  for suspected muscle strain I have reviewed the patients home medicines and have made adjustments as needed    Problem List / ED Course:  Left shoulder pain Neck pain No current shoulder or neck pain Based on HPI, started with left shoulder pain along left upper trapezius then progressed to neck pain along sternocleidomastoid seemingly consistent with muscle strain CT of cervical neck does not show any acute abnormalities, masses, abscess.  No history of IVDU nor malignancy. No complaints of headache.  No nuchal rigidity.  No fever in ED.  Low suspicion for meningitis Neurologically intact with no motor nor sensory deficits.  No visual disturbances nor complaints of dizziness.  Able to ambulate without difficulty nor requiring assistance.  Low  suspicion for CVA/TIA Did consider vertebral artery dissection.  I am reassured that she is neurologically intact with no facial paresthesia, slurred speech, aphasia.  No visual disturbances no nystagmus on exam.  Shared decision making is had with patient regarding obtaining CTA to rule this out and patient wishes to go home as her ride is here and she would not have a ride home otherwise.  She does not wish to proceed with CT imaging.  I discussed strict return precautions to include  symptoms of stroke, neurological deficits to return for further imaging, assessment Will provide Robaxin  for muscle strain and have her continue meloxicam, Tylenol  as she has been doing at home.  Also discussed heating pads, topical lidocaine  patches, topical Voltaren gel for localized pain relief to see if this improves pain Will provide neurosurgery, spine referral as she reports concern regarding previous diagnosis of C1 and C2 degenerative pathology and chronic neck cracking   Reevaluation:  After the interventions noted above, I reevaluated the patient and found that they have :stayed the same    Dispostion:  After consideration of the diagnostic results and the patients response to treatment, I feel that the patent would benefit from outpatient management symptomatic treatment and neurosurgery follow-up as needed.   Discussed ED workup, disposition, return to ED precautions with patient who expresses understanding agrees with plan.  All questions answered to their satisfaction.  They are agreeable to plan.  Discharge instructions provided on paperwork  Final diagnoses:  Neck pain    ED Discharge Orders     None        Minnie Tinnie BRAVO, PA 01/29/24 1649    Cottie Donnice PARAS, MD 01/29/24 [phone]

## 2024-01-29 NOTE — ED Provider Triage Note (Signed)
 Emergency Medicine Provider Triage Evaluation Note  Teresa Nguyen , a 72 y.o. female  was evaluated in triage.  Pt complains of complaining of left shoulder pain last night that then moved into her neck this morning but now has gone away.  She reports that she had been told that she had very degenerative bones in her C1 and C2 and if she ever had pain she needed to have it checked out immediately.  She is not having any weakness in her arms denies any trauma but reports she feels her neck crack frequently  Review of Systems  Positive: Neck and shoulder pain Negative: Weakness, numbness or tingling  Physical Exam  BP (!) 166/75 (BP Location: Right Arm)   Pulse 66   Temp 98.6 F (37 C)   Resp (!) 22   SpO2 100%  Gen:   Awake, no distress   Resp:  Normal effort  MSK:   Moves extremities without difficulty, normal strength in the upper and lower extremities.  No reproducible pain at this time in the shoulder or neck Other:  Sensation intact in the upper extremities  Medical Decision Making  Medically screening exam initiated at 12:42 PM.  Appropriate orders placed.  Teresa Nguyen was informed that the remainder of the evaluation will be completed by another provider, this initial triage assessment does not replace that evaluation, and the importance of remaining in the ED until their evaluation is complete.     Teresa Folks, MD 01/29/24 [phone]

## 2024-01-29 NOTE — ED Triage Notes (Signed)
 Pt states about 18 months ago she had an xray and was told her C1 is deteriorating and there is nothing to be done and she will eventually be paralyzed. Pt was told not to turn her head

## 2024-02-11 DIAGNOSIS — G40219 Localization-related (focal) (partial) symptomatic epilepsy and epileptic syndromes with complex partial seizures, intractable, without status epilepticus: Secondary | ICD-10-CM

## 2024-02-14 ENCOUNTER — Encounter: Payer: Self-pay | Admitting: Radiology

## 2024-02-14 DIAGNOSIS — G40219 Localization-related (focal) (partial) symptomatic epilepsy and epileptic syndromes with complex partial seizures, intractable, without status epilepticus: Secondary | ICD-10-CM | POA: Diagnosis not present

## 2024-02-15 NOTE — Telephone Encounter (Signed)
 Spoke with patient-she completed EEG over the weekend, equipment was picked up yesterday. Waiting on results

## 2024-02-18 ENCOUNTER — Other Ambulatory Visit: Payer: Self-pay | Admitting: Neurology

## 2024-02-18 ENCOUNTER — Encounter (INDEPENDENT_AMBULATORY_CARE_PROVIDER_SITE_OTHER): Payer: Self-pay | Admitting: Neurology

## 2024-02-18 DIAGNOSIS — G40219 Localization-related (focal) (partial) symptomatic epilepsy and epileptic syndromes with complex partial seizures, intractable, without status epilepticus: Secondary | ICD-10-CM

## 2024-02-18 NOTE — Procedures (Signed)
    CLINICAL HISTORY:  73 year old woman with history of intractable, likely frontal lobe epilepsy. EMU monitoring in 2015 captured three stereotyped events with right temporal slowing and sharp waves. Epileptic seizures, with onset since childhood, consist mostly of oral automatisms with various degrees of evolution. They have been well controlled with LCM, 200 BID, along with TPM 125 BID. She previously failed PB, PHT, LTG, and ZON. Of note, video EEG in 2015 also caught an event with oral dyskinesia that was thought to be noneplleptic. These are more rare.   MEDICATIONS:  Topiramate  (Tab), Acetaminophen , amLODIPine  Besylate, Atorvastatin  Calcium , Calcium  Carbonate-Vitamin D, Carboxymethylcellulose Sodium, cycloSPORINE (Emulsion), diazePAM  (Tab), Diclofenac Sodium (Gel), Fluticasone Propionate, Lacosamide  (Tab), Levothyroxine  Sodium (Tab), Lidocaine -Prilocaine , Loperamide HCI (Tab), LORazepam  (Tab), Meclizine  HCI (Tab), Meloxicam (Tab), Methocarbamol  (Tab), Multiple Vitamin (Tab), Omega-3 Fatty Acids (Cap), oxyCODONEAcetaminophen (Tab), Polyethylene Glycol 3350 , Potassium Chloride  Crys ER, rOPINIRole  HCI (Tab), Vitamin E  (Cap)   TECHNICAL SUMMARY:  Electrodes are applied using the standard 10-20 international placement protocol. This ambulatory EEG recording was performed utilizing 23 electrode inputs including: (19) cephalic, (1) ground, (1) system reference, and (2) EKG. The EEG recording can be visualized in all standard types of montages for review. The recordings are done at a sampling rate of 200 samples per second, per channel, allowing for relatively high frequency responses of 70 Hz. EEG playbacks are done with digital high frequency filters noted at the top of the page. The EEG is notated with patient events at the direction of the patient by pressing a push button mounted on a waist worn EEG recording device. This EEG was also recorded using high definition digital video.   TECHNOLOGIST NOTE:   No skull defect or scalp edemas were present.   BACKGROUND:  The record was obtained in the awake state. No sleep was able to be captured. The posterior awake dominant background activity was a continuous, reactive rhythm of 7-9 Hz with intermittent generalized slowing. This was symmetric and well-modulated and attenuated with eye opening. Symmetric diffuse frontocentral beta range activity was present.   SIGNIFICANT FINDINGS:  There was mild intermittent generalized slowing. There were no apparent electrographic or clinical seizures noted by the reviewing neurodiagnostic technologist.   PATIENT EVENTS:  There were no patient events noted or captured during this recording.   HYPERVENTILATION:  Hyperventilation was not performed.   PHOTIC STIMULATION:  IPS Was not performed.  EKG:  There were no arrhythmias or abnormalities noted during this recording. 66 BPM.    Impression:  This is a abnormal routine EEG due to mild intermittent generalized slowing. This is consistent with a mild diffuse brain dysfunction, non specific etiology. There were no seizures or epileptiform discharges seen.    Deeya Richeson, MD [facility]

## 2024-02-18 NOTE — Procedures (Signed)
 Patient Name: Teresa Nguyen  MRN: [id number]  Referring Physician/Provider: Margaret    Study start date: 02/11/2024 at 0305 PM Study end date: 02/14/2024 at 1245 PM Duration: 69 hours    Clinical History:  73 year old woman with history of intractable, likely frontal lobe epilepsy. EMU monitoring in 2015 captured three stereotyped events with right temporal slowing and sharp waves. Epileptic seizures, with onset since childhood, consist mostly of oral automatisms with various degrees of evolution. They have been well controlled with LCM, 200 BID, along with TPM 125 BID. She previously failed PB, PHT, LTG, and ZON. Of note, video EEG in 2015 also caught an event with oral dyskinesia that was thought to be noneplleptic. These are more rare.   INTERMITTENT MONITORING with VIDEO TECHNICAL SUMMARY:  This AVEEG was performed using equipment provided by [employer] utilizing Bluetooth ( [employer] ) amplifiers with continuous EEGT attended video collection using encrypted remote transmission via [employer] secured cellular tower network with data rates for each AVEEG performed. This is a therapist, music AVEEG, obtained, according to the 10-20 international electrode placement system, reformatted digitally into referential and bipolar montages. Data was acquired with a minimum of 21 bipolar connections and sampled at a minimum rate of 250 cycles per second per channel, maximum rate of 450 cycles per second per channel and two channels for EKG. The entire VEEG study was recorded through cable and or radio telemetry for subsequent analysis. Specified epochs of the AVEEG data were identified at the direction of the subject by the depression of a push button by the patient. Each patients event file included data acquired two minutes prior to the push button activation and continuing until two minutes afterwards. AVEEG files were reviewed on [employer] [facility] server, Licensed Software provided  by [employer] with a digital high frequency filter set at 70 Hz and a low frequency filter set at 1 Hz with a paper speed of 52mm/s resulting in 10 seconds per digital page. This entire AVEEG was reviewed by the EEG Technologist. Random time samples, random sleep samples, clips, patient initiated push button files with included patient daily diary logs, EEG Technologist pruned data was reviewed and verified for accuracy and validity by the governing reading neurologist in full details. This AEEGV was fully compliant with all requirements for CPT 97500 for setup, patient education, take down and administered by an EEG technologist.   Long-Term EEG with Video was monitored intermittently by a qualified EEG technologist for the entirety of the recording; quality check-ins were performed at a minimum of every two hours, checking and documenting real-time data and video to assure the integrity and quality of the recording (e.g., camera position, electrode integrity and impedance), and identify the need for maintenance. For intermittent monitoring, an EEG Technologist monitored no more than 12 patients concurrently. Diagnostic video was captured at least 80% of the time during the recording.   PATIENT EVENTS:  There were no patient events noted or captured during this recording.   TECHNOLOGIST EVENTS:  No clear epileptiform activity was detected by the reviewing neurodiagnostic technologist for further review.   TIME SAMPLES:  10-minutes of every 2 hours recorded are reviewed as random time samples.   SLEEP SAMPLES:  5-minutes of every 24 hours recorded are reviewed as random sleep samples.   AWAKE:  At maximal level of alertness, the posterior dominant background activity was continuous, reactive, low voltage rhythm of 9 Hz with intermittent diffuse slowing. This was symmetric, well-modulated, and attenuated  with eye opening. Diffuse, symmetric, frontocentral beta range activity was present.   SLEEP:  N1  Sleep (Stage 1) was observed and characterized by the disappearance of alpha rhythm and the appearance of vertex activity.   N2 Sleep (Stage 2) was observed and characterized by vertex waves, K-complexes, and sleep spindles.   N3 (Stage 3) sleep was observed and characterized by high amplitude Delta activity of 20%.   REM sleep was observed.   EKG:  There were no arrhythmias or abnormalities noted during this recording. 66 BPM.   Impression:  Abnormal EEG due to intermittent generalized slowing   Clinical Correlation:  This ambulatory EEG is suggestive of mild diffuse encephalopathy, non specific etiology. No epileptiform discharges were seen. There were no electrographic seizures noted. No events were captured during the recording.    Rontrell Moquin, MD [facility]

## 2024-03-05 ENCOUNTER — Emergency Department (HOSPITAL_COMMUNITY)
Admission: EM | Admit: 2024-03-05 | Discharge: 2024-03-05 | Disposition: A | Discharge status: Home / Self Care | Attending: Emergency Medicine | Admitting: Emergency Medicine

## 2024-03-05 ENCOUNTER — Encounter (HOSPITAL_COMMUNITY): Payer: Self-pay

## 2024-03-05 ENCOUNTER — Emergency Department (HOSPITAL_COMMUNITY)

## 2024-03-05 ENCOUNTER — Other Ambulatory Visit: Payer: Self-pay

## 2024-03-05 DIAGNOSIS — Y92000 Kitchen of unspecified non-institutional (private) residence as  the place of occurrence of the external cause: Secondary | ICD-10-CM | POA: Diagnosis not present

## 2024-03-05 DIAGNOSIS — S0990XA Unspecified injury of head, initial encounter: Secondary | ICD-10-CM

## 2024-03-05 DIAGNOSIS — W01198A Fall on same level from slipping, tripping and stumbling with subsequent striking against other object, initial encounter: Secondary | ICD-10-CM | POA: Diagnosis not present

## 2024-03-05 DIAGNOSIS — Z91010 Allergy to peanuts: Secondary | ICD-10-CM | POA: Insufficient documentation

## 2024-03-05 DIAGNOSIS — Y9301 Activity, walking, marching and hiking: Secondary | ICD-10-CM | POA: Insufficient documentation

## 2024-03-05 DIAGNOSIS — S0081XA Abrasion of other part of head, initial encounter: Secondary | ICD-10-CM | POA: Diagnosis not present

## 2024-03-05 DIAGNOSIS — W19XXXA Unspecified fall, initial encounter: Secondary | ICD-10-CM

## 2024-03-05 NOTE — ED Provider Notes (Signed)
 [place] EMERGENCY DEPARTMENT AT [facility] Provider Note   CSN: [id number] Arrival date & time: 03/05/24  1209     Patient presents with: Teresa Nguyen is a 73 y.o. female.   Patient is here for evaluation after unwitnessed mechanical fall this morning.  Patient was walking in her kitchen where there is tile that is uneven when her foot caught one of the tiles and she fell backwards, hitting the back of her head.  She denies loss of consciousness.  She is not taking blood thinners.  She denies nausea, vomiting, weakness, vision changes, or headache.  She had her phone on her hand when she fell so she immediately called 911 who brought her here today.  She reports a small circular swollen area to the back left side of her head that has shrunk in size since initial fall.  She continues to have some pain in this area.  She denies any other pain at this time.  She has not attempted to ambulate since the fall as EMS states they lifted her from the floor, transferred her on the stretcher, and then slid her from their stretcher to the current hospital bed.  Patient was able to move to the bathroom while here today without incident.  Patient wanted to be evaluated today as she has a history of seizures and figured it would be in her best interest to have imaging performed to ensure she was okay.  She takes seizure medication daily and took this medication today; she cannot recall the last time she has had a seizure.  Patient was offered Tylenol  for head pain; patient declined.  The history is provided by the patient. No language interpreter was used.  Fall       Prior to Admission medications   Medication Sig Start Date End Date Taking? Authorizing Provider  acetaminophen  (TYLENOL ) 500 MG tablet Take 500 mg by mouth 2 (two) times daily as needed for moderate pain or headache.    [provider]  amLODipine  (NORVASC ) 5 MG tablet Take 1 tablet (5 mg total) by mouth  daily. 12/29/17   Rosario Leatrice FERNS, MD  atorvastatin  (LIPITOR) 10 MG tablet Take 10 mg by mouth at bedtime.  05/04/13   [provider]  Calcium  Carbonate-Vitamin D (CALCIUM -D PO) Take 2 tablets by mouth daily.     [provider]  Carboxymethylcellulose Sodium (REFRESH LIQUIGEL OP) Place 1 drop into both eyes daily as needed (dry eyes).    [provider]  cycloSPORINE (RESTASIS) 0.05 % ophthalmic emulsion 1 drop 2 (two) times daily. 04/20/21   [provider]  diazepam  (VALIUM ) 10 MG tablet Take 10 mg by mouth daily as needed (seizure).    [provider]  diclofenac sodium (VOLTAREN) 1 % GEL Apply 1 application topically at bedtime. 02/07/18   [provider]  fluticasone (FLONASE) 50 MCG/ACT nasal spray Place 2 sprays into both nostrils at bedtime as needed for allergies.     [provider]  lacosamide  (VIMPAT ) 200 MG TABS tablet Take 1 tablet by mouth twice daily 01/20/24   Penumalli, Vikram R, MD  levothyroxine  (SYNTHROID , LEVOTHROID) 25 MCG tablet Take 25 mcg by mouth daily before breakfast.  05/04/13   [provider]  lidocaine -prilocaine  (EMLA ) cream Apply to feet daily as needed for pain 01/17/24   McDonald, Adam R, DPM  loperamide (IMODIUM A-D) 2 MG tablet Take 1 mg by mouth 4 (four) times daily as needed for  diarrhea or loose stools.    [provider]  LORazepam  (ATIVAN ) 1 MG tablet Take 1 tablet (1 mg total) by mouth 2 (two) times daily as needed for anxiety. 06/12/21   Armenta Canning, MD  meclizine  (ANTIVERT ) 25 MG tablet Take 1 tablet (25 mg total) by mouth 3 (three) times daily as needed for dizziness. 02/04/23   Long, Joshua G, MD  meloxicam (MOBIC) 7.5 MG tablet  07/28/19   [provider]  methocarbamol  (ROBAXIN ) 500 MG tablet Take 1 tablet (500 mg total) by mouth 2 (two) times daily. 01/29/24   Minnie Tinnie BRAVO, PA  methocarbamol  (ROBAXIN ) 750 MG tablet Take 1 tablet (750 mg total) by mouth 2  (two) times daily as needed for muscle spasms. Patient taking differently: Take 750 mg by mouth at bedtime. 12/28/17   Rosario Leatrice FERNS, MD  Multiple Vitamins-Minerals (MULTIVITAMIN ADULT) TABS Take 1 tablet by mouth daily.    [provider]  Omega-3 Fatty Acids (FISH OIL) 1000 MG CAPS Take 1,000 mg by mouth in the morning and at bedtime.    [provider]  oxyCODONE -acetaminophen  (PERCOCET/ROXICET) 5-325 MG tablet Take 1-2 tablets by mouth every 6 (six) hours as needed for severe pain. 12/02/22   Jerrol Agent, MD  polyethylene glycol (MIRALAX  / GLYCOLAX ) packet Take 4.25 g by mouth daily.     [provider]  potassium chloride  SA (KLOR-CON  M) 20 MEQ tablet Take 1 tablet (20 mEq total) by mouth daily for 14 days. 05/16/22 05/30/22  Trine Raynell Moder, MD  rOPINIRole  (REQUIP ) 1 MG tablet Take 1 tablet (1 mg total) by mouth daily. 12/29/17   Rosario Leatrice FERNS, MD  sodium chloride  (OCEAN) 0.65 % SOLN nasal spray Place 1 spray into both nostrils as needed for congestion.    [provider]  topiramate  (TOPAMAX ) 100 MG tablet Take 1 tablet by mouth twice daily 01/17/24   Penumalli, Vikram R, MD  vitamin E  400 UNIT capsule Take 400 Units by mouth at bedtime.    [provider]    Allergies: Peanut oil, Nickel, Other, and Sulfa antibiotics    Review of Systems  Skin:  Positive for wound (Head injury with fall).  Neurological:  Negative for seizures and syncope.    Updated Vital Signs BP 136/73   Pulse (!) 59   Temp 98.4 F (36.9 C)   Resp 18   SpO2 99%   Physical Exam Vitals and nursing note reviewed.  Constitutional:      General: She is not in acute distress.    Appearance: Normal appearance. She is normal weight. She is not ill-appearing.  HENT:     Head: Normocephalic.      Comments: Small circular abrasion to the left rear of head with minimal swelling tenderness with palpation.  No active bleeding at site or dried blood noted in  the hair.    Nose: Nose normal. No rhinorrhea.  Eyes:     General: No scleral icterus.    Extraocular Movements: Extraocular movements intact.     Conjunctiva/sclera: Conjunctivae normal.     Pupils: Pupils are equal, round, and reactive to light.  Cardiovascular:     Rate and Rhythm: Normal rate and regular rhythm.     Pulses: Normal pulses.     Heart sounds: Normal heart sounds.  Pulmonary:     Effort: Pulmonary effort is normal. No respiratory distress.     Breath sounds: Normal breath sounds. No stridor. No wheezing, rhonchi or rales.  Abdominal:     General: Abdomen is flat. Bowel sounds are normal. There is no distension.     Palpations: Abdomen is soft.     Tenderness: There is no abdominal tenderness. There is no guarding.  Musculoskeletal:        General: No swelling, tenderness, deformity or signs of injury. Normal range of motion.     Cervical back: Normal range of motion and neck supple. No rigidity or tenderness.  Skin:    General: Skin is warm and dry.     Capillary Refill: Capillary refill takes less than 2 seconds.  Neurological:     Mental Status: She is alert and oriented to person, place, and time.     Sensory: No sensory deficit.     Motor: No weakness.     Coordination: Coordination normal.     (all labs ordered are listed, but only abnormal results are displayed) Labs Reviewed - No data to display  EKG: None  Radiology: CT Head Wo Contrast Result Date: 03/05/2024 EXAM: CT HEAD WITHOUT CONTRAST 03/05/2024 01:41:14 PM TECHNIQUE: CT of the head was performed without the administration of intravenous contrast. Automated exposure control, iterative reconstruction, and/or weight based adjustment of the mA/kV was utilized to reduce the radiation dose to as low as reasonably achievable. COMPARISON: CT head 02/04/2023 CLINICAL HISTORY: Head trauma, minor (Age >= 65y) FINDINGS: BRAIN AND VENTRICLES: No acute hemorrhage. No evidence of acute infarct. No  hydrocephalus. No extra-axial collection. No mass effect or midline shift. ORBITS: No acute abnormality. SINUSES: No acute abnormality. SOFT TISSUES AND SKULL: Left scalp hematoma measuring up to 6 mm. No skull fracture. IMPRESSION: 1. No acute intracranial abnormality. Electronically signed by: Morgane Naveau MD 03/05/2024 01:51 PM EST RP Workstation: [id number]     Procedures   Medications Ordered in the ED - No data to display    Patient presents to the ED for concern of head injury with fall, this involves an extensive number of treatment options, and is a complaint that carries with it a high risk of complications and morbidity.  The differential diagnosis includes intracranial hemorrhage, skull fracture.   Co morbidities that complicate the patient evaluation  History of seizures   Imaging Studies ordered:  I ordered imaging studies including CT head without contrast I independently visualized and interpreted imaging which showed no acute abnormalities, hemorrhage, fractures, or masses. I agree with the radiologist interpretation   Test Considered:  CT cervical spine without contrast: Reassuring patient condition as she is a good historian of the event and physical exam with no major concerns of spinal involvement or injury.   Problem List / ED Course:  Patient is here for evaluation of head injury with fall earlier today.  Patient not taking blood thinners.  Physical exam is reassuring for no spinal involvement or injury.  CT head without contrast shows no intracranial hemorrhage, masses, or fractures.   Reevaluation:  After the interventions noted above, I reevaluated the patient and found that they have :improved   Dispostion:  After consideration of the diagnostic results and the patients response to treatment, I feel that the patent would benefit from supportive care in the home setting.  Patient will return if she has any additional falls or medical  complaints.  Medical Decision Making Amount and/or Complexity of Data Reviewed Radiology: ordered.    Final diagnoses:  Fall, initial encounter  Injury of head, initial encounter    ED Discharge Orders     None  Rosina Almarie LABOR, PA-C 03/05/24 1529    Horton, Roxie HERO, DO 03/09/24 1302

## 2024-03-05 NOTE — Discharge Instructions (Signed)
 It was a pleasure meeting with you today.  As we discussed there were no acute findings on your head imaging today.  Please return if you have any further concerns or medical complaints.

## 2024-03-05 NOTE — ED Triage Notes (Signed)
 BIBA for head injury. Pt fell backwards on kitchen floor, striking head.No thinners, no LOC. Hx of seizures

## 2024-04-17 ENCOUNTER — Institutional Professional Consult (permissible substitution): Admitting: Diagnostic Neuroimaging

## 2024-04-17 ENCOUNTER — Encounter: Payer: Self-pay | Admitting: Diagnostic Neuroimaging

## 2024-04-17 DIAGNOSIS — G40219 Localization-related (focal) (partial) symptomatic epilepsy and epileptic syndromes with complex partial seizures, intractable, without status epilepticus: Secondary | ICD-10-CM

## 2024-04-17 MED ORDER — LACOSAMIDE 200 MG PO TABS: 200.0000 mg | ORAL_TABLET | Freq: Two times a day (BID) | ORAL | 5 refills | Status: AC

## 2024-04-17 MED ORDER — TOPIRAMATE 100 MG PO TABS: 100.0000 mg | ORAL_TABLET | Freq: Two times a day (BID) | ORAL | 4 refills | Status: AC

## 2024-04-17 NOTE — Progress Notes (Signed)
 "  [facility]  PATIENT: Teresa Nguyen DOB: [birth year]  REFERRING CLINICIAN: Dulin, Michael, MD  HISTORY FROM: patient  REASON FOR VISIT: follow up    HISTORICAL  CHIEF COMPLAINT:  Chief Complaint  Patient presents with   RM 6     Patient is here alone for Epilepsy - has been going on since she was 74 years old after falling down a flight of stairs. That was what she was told. She also wants to discuss EEG results     HISTORY OF PRESENT ILLNESS:   UPDATE (04/17/24, VRP): Since last visit, doing well. Symptoms are improved. Anxiety and stress are better. VEEG was unremarkable except for mild slowing. Tolerating meds.   PHONE MESSAGE UPDATE (01/24/23, VRP): I called patient back. Having 3-4 hours of difficulty speaking and functioning.  Having some difficulty with breathing and focusing.  She was able to walk to her neighbors house and ask for some help (neighbors name is Teresa Nguyen).  I spoke with patient and Teresa Nguyen over the phone today.  Patient also asking for additional diazepam  which she claims she is out of.  This was supposed to be prescribed by psychiatry.   I discussed option to go to the emergency room for evaluation this evening.  Other option is to follow-up in the clinic with us  and we may consider additional home EEG testing to rule out seizure versus nonepileptic spells (patient has both of these diagnoses for the past).  Also patient may want to follow-up with psychiatry in case this may represent a anxiety / stress reaction (which she has also had in the past).   Patient and friend will monitor symptoms for this evening and call us  or message us  in the morning.  UPDATE (08/20/23, VRP): Since last visit, doing well. No seizures. Some ongoing chronic left foot pain / numbness, eval'd by neurology in 2005, no cause found. Then followed by podiatry in 2023 and 2024 and dx'd with  tendonitis, metatarsalgia, tarsal tunnel syndrome. Now left foot numbness is  improved. Some ongoing left knee pain. No major issues with hands.   UPDATE (05/12/22, VRP): Since last visit, doing about the same. Did not stop the 25mg  topiramate  as per last visit. No alleviating or aggravating factors. Tolerating meds.    UPDATE (05/12/21, VRP): Since last visit, here for evaluation of memory issues. Mild memory lapses, but no changes in ADLs. In fact, she is able to take care of herself and her neighbors. No spells or seizures.   UPDATE (05/03/18, Dr. Nivia, [facility]): 74 year old woman with history of intractable, likely frontal lobe epilepsy. EMU monitoring in 2015 captured three stereotyped events with right temporal slowing and sharp waves. Epileptic seizures, with onset since childhood, consist mostly of oral automatisms with various degrees of evolution. They have been well controlled with LCM, 200 BID, along with TPM 125 BID. She previously failed PB, PHT, LTG, and ZON. Of note, video EEG in 2015 also caught an event with oral dyskinesia that was thought to be nonepileptic. These are more rare.    UPDATE (12/29/17, VRP): Since last visit, doing well until 12/21/17 and 12/27/17 --> chest muscle spasm, difficulty talking, but no LOC. Each attack lasted 10-20 seconds, 30-50 attacks. Went to ER on 12/21/17. Then again to ER in 12/27/17. Had been taking CBZ for 3 months prior, but felt like it was triggering seizures, so she stopped in early Sept 2019. Mood is stable.   UPDATE (10/11/17, VRP): Since last  visit, doing well until 1-2 weeks ago, woke up in night with chest muscle spasm; lasted 30 minutes. Then again over next 2 nights, 30-45 minutes. Also last night with similar events. No LOC or altered consciousness.  No aphasia. Tolerating meds. Having more GI issues (diarrhea) in last few months.   UPDATE (03/17/17, VRP): Since last visit, doing well. Tolerating meds. No alleviating or aggravating factors. No seizures. Has seen Dr. Olam Nguyen (psychiatry) and was started on Equetro   (carbamazepine  ER samples).   UPDATE 08/25/16: Since last visit, no seizures. Had some aura sensations which consistent of pulse in the head sensation, esp with stress. Tolerating meds.   UPDATE 12/31/15: Last night forgot vimpat  dose; this morning at 4am, woke up, had to urinate, stood up and then fell back on to bed, may have had brief LOC. Thinks she had a seizure. No post-ictal confusion.  No tongue biting. Restless legs stable with medication.   UPDATE 12/31/14: Since last visit, doing well, no seizures. Used valium  10mg  x 1 time in the last year for a seizure aura. Tolerating vimpat  200mg  BID + TPX 100mg  BID. Went to ER in Feb for syncope (dx with dehydration).   UPDATE 12/29/13 (LL): Since last visit, went to [facility] to have EMU monitoring. EMU report --> June 15-18, 2015: Long-term video EEG monitoring captured multiple events; including electrographic frontal hypermotor seizures. She also exhibited episodes of oral dyskinetic activity which is not seizure (no EEG correlate). It was concluded that her typical arousal events are consistent with frontal lobe epilepsy. Home medications Vimpat  200 mg twice a day,Topirimate 100 mg twice a day were resumed, but they recommended reducing home dose of Valium  as she was taking 10-20 mg when necessary which likely to affects her affect and impairs her communication. She has had several close family members who have died in the last year, including her mother and her sister who died suddenly. Patient is very content on her current medication regimen, stating that she has felt better on Vimpat  than ever before. She has not had many nocturnal seizures since starting Vimpat , and states that she was afraid to go to sleep prior to starting it. Her past AEDs have been phenobarbital, phenytoin, Lacosamide , lamotrigine, levetiracetam, Topirimate, valproic acid and zonisamide. Known seizure triggers are eating broccoli or eggplant. RLS symptoms are controlled on  Ropinirole , although some nights she must take an extra 0.25 of a tablet to be comfortable.  UPDATE 12/28/12: Outside neurologist records received and reviewed. 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 Since last visit, no more seizures or spells. Her RLS is getting worse, b/c she is running out of ropinirole  and was reducing the dose. Still with high stress. Not interested in seeing psychiatry.   UPDATE 06/10/12: Since last visit, sz stopped, until Dec 2013. She was preparing for colonoscopy, and anticipated having diarrhea from bowel prep. Before she took the bowel prep meds, she started with diarrhea. Soon after she had a seizure (no LOC, eye up, head turned, mouth twitching; no generalized convulsions. she could hear and move, but not talk). She took a valium , and her spell stopped. I still haven't received records from prior neurologist. Patient tells me that she was dx'd with partial non-epileptic seizures. She was told to take valium  prn to stop her seizures at home, in order to avoid freq ER trips.    PRIOR HPI (12/04/11): 74 year old right-handed female here for evaluation of seizure disorder. At age 38 years old,  patient fell down a flight of stairs. Soon after she began to have petit mall seizures. Patient states she was having inability to speak, convulsions and tongue biting. She started on Dilantin and phenobarbital early in life. Over the years she tried many different seizure medications. She was living in [place]  for most of her life and followed by neurology there. She had extensive testing with EEG, video EEG, sleep study, MRI, without any significant abnormalities. Unclear if any of these seizures were captured during EEG recordings. It sounds like no epileptiform discharges were ever noted. Unfortunately I do not have any of her prior records to review myself for this visit. Patient has been on Vimpat  and topiramate  over the past year, and now has one  episode every 3 weeks of seizure. Current episodes consist of gagging sensation, inability to speak, without loss of consciousness tongue biting or convulsions.    REVIEW OF SYSTEMS: Full 14 system review of systems performed and negative except: seizure drooling memory loss.   ALLERGIES: Allergies  Allergen Reactions   Peanut Oil Diarrhea    Nuts in general    Nickel Other (See Comments)   Other Diarrhea    Nuts and dark green vegetables cause diarrhea and dehydration   Sulfa Antibiotics Itching and Rash    Red all over    HOME MEDICATIONS: Outpatient Medications Prior to Visit  Medication Sig Dispense Refill   acetaminophen  (TYLENOL ) 500 MG tablet Take 500 mg by mouth 2 (two) times daily as needed for moderate pain or headache.     amLODipine  (NORVASC ) 5 MG tablet Take 1 tablet (5 mg total) by mouth daily. 30 tablet 0   atorvastatin  (LIPITOR) 10 MG tablet Take 10 mg by mouth at bedtime.      Calcium  Carbonate-Vitamin D (CALCIUM -D PO) Take 2 tablets by mouth daily.      Carboxymethylcellulose Sodium (REFRESH LIQUIGEL OP) Place 1 drop into both eyes daily as needed (dry eyes).     cycloSPORINE (RESTASIS) 0.05 % ophthalmic emulsion 1 drop 2 (two) times daily.     diazepam  (VALIUM ) 10 MG tablet Take 10 mg by mouth daily as needed (seizure).     fluticasone (FLONASE) 50 MCG/ACT nasal spray Place 2 sprays into both nostrils at bedtime as needed for allergies.      lacosamide  (VIMPAT ) 200 MG TABS tablet Take 1 tablet by mouth twice daily 60 tablet 5   levothyroxine  (SYNTHROID , LEVOTHROID) 25 MCG tablet Take 25 mcg by mouth daily before breakfast.      loperamide (IMODIUM A-D) 2 MG tablet Take 1 mg by mouth 4 (four) times daily as needed for diarrhea or loose stools.     meloxicam (MOBIC) 7.5 MG tablet      methocarbamol  (ROBAXIN ) 500 MG tablet Take 1 tablet (500 mg total) by mouth 2 (two) times daily. 20 tablet 0   methocarbamol  (ROBAXIN ) 750 MG tablet Take 1 tablet (750 mg total) by  mouth 2 (two) times daily as needed for muscle spasms. (Patient taking differently: Take 750 mg by mouth at bedtime.) 20 tablet 0   Multiple Vitamins-Minerals (MULTIVITAMIN ADULT) TABS Take 1 tablet by mouth daily.     Omega-3 Fatty Acids (FISH OIL) 1000 MG CAPS Take 1,000 mg by mouth in the morning and at bedtime.     oxyCODONE -acetaminophen  (PERCOCET/ROXICET) 5-325 MG tablet Take 1-2 tablets by mouth every 6 (six) hours as needed for severe pain. 15 tablet 0   polyethylene glycol (MIRALAX  / GLYCOLAX ) packet Take 4.25 g  by mouth daily.      potassium chloride  SA (KLOR-CON  M) 20 MEQ tablet Take 1 tablet (20 mEq total) by mouth daily for 14 days. 14 tablet 0   rOPINIRole  (REQUIP ) 1 MG tablet Take 1 tablet (1 mg total) by mouth daily. 30 tablet 0   sodium chloride  (OCEAN) 0.65 % SOLN nasal spray Place 1 spray into both nostrils as needed for congestion.     topiramate  (TOPAMAX ) 100 MG tablet Take 1 tablet by mouth twice daily 60 tablet 11   vitamin E  400 UNIT capsule Take 400 Units by mouth at bedtime.     diclofenac sodium (VOLTAREN) 1 % GEL Apply 1 application topically at bedtime. (Patient not taking: Reported on 04/17/2024)     lidocaine -prilocaine  (EMLA ) cream Apply to feet daily as needed for pain (Patient not taking: Reported on 04/17/2024) 120 g 2   LORazepam  (ATIVAN ) 1 MG tablet Take 1 tablet (1 mg total) by mouth 2 (two) times daily as needed for anxiety. (Patient not taking: Reported on 04/17/2024) 15 tablet 0   meclizine  (ANTIVERT ) 25 MG tablet Take 1 tablet (25 mg total) by mouth 3 (three) times daily as needed for dizziness. (Patient not taking: Reported on 04/17/2024) 30 tablet 0   No facility-administered medications prior to visit.    PAST MEDICAL HISTORY: Past Medical History:  Diagnosis Date   Anxiety    Arthritis    Arthritis of neck    DVT (deep venous thrombosis) (HCC) 2008   L leg   Finger fracture, right    small   Major depressive disorder    pt denies   Migraine     Seizure disorder [facility]    since age 30-3, last sz 12/31/15   Thyroid disease    Wears partial dentures     PAST SURGICAL HISTORY: Past Surgical History:  Procedure Laterality Date   BIOPSY  10/17/2018   Procedure: BIOPSY;  Surgeon: Dianna Specking, MD;  Location: [facility];  Service: Endoscopy;;   BREAST EXCISIONAL BIOPSY Bilateral    1990s   COLONOSCOPY WITH PROPOFOL  N/A 10/17/2018   Procedure: COLONOSCOPY WITH PROPOFOL ;  Surgeon: Dianna Specking, MD;  Location: [facility];  Service: Endoscopy;  Laterality: N/A;   ESOPHAGOGASTRODUODENOSCOPY (EGD) WITH PROPOFOL  N/A 10/17/2018   Procedure: ESOPHAGOGASTRODUODENOSCOPY (EGD) WITH PROPOFOL ;  Surgeon: Dianna Specking, MD;  Location: [facility];  Service: Endoscopy;  Laterality: N/A;   NASAL SINUS SURGERY     POLYPECTOMY  10/17/2018   Procedure: POLYPECTOMY;  Surgeon: Dianna Specking, MD;  Location: [facility];  Service: Endoscopy;;   TONSILLECTOMY     TUBAL LIGATION      FAMILY HISTORY: Family History  Problem Relation Age of Onset   Stroke Father    Heart attack Father    Throat cancer Sister    Seizures Sister    Migraines Sister    Kidney Stones Sister    Diabetes Maternal Grandmother    Diabetes Maternal Grandfather    Sleep apnea Neg Hx     SOCIAL HISTORY:  Social History   Socioeconomic History   Marital status: Divorced    Spouse name: Not on file   Number of children: 2   Years of education: 12th   Highest education level: Not on file  Occupational History   Occupation: disabled  Tobacco Use   Smoking status: Former    Current packs/day: 0.00    Types: Cigarettes    Quit date: 04/16/1998    Years since quitting: 26.0  Smokeless tobacco: Never  Vaping Use   Vaping status: Never Used  Substance and Sexual Activity   Alcohol use: Not Currently   Drug use: Not Currently   Sexual activity: Not Currently    Birth control/protection: Post-menopausal  Other Topics Concern   Not on file  Social  History Narrative   Patient lives at home alone.Caffeine Use: 1-2  cup of coffee daily   Right handed   Social Drivers of Health   Tobacco Use: Medium Risk (04/17/2024)   Patient History    Smoking Tobacco Use: Former    Smokeless Tobacco Use: Never    Passive Exposure: Not on Actuary Strain: Not on file  Food Insecurity: Not on file  Transportation Needs: Not on file  Physical Activity: Not on file  Stress: Not on file  Social Connections: Unknown (08/26/2021)   Received from [facility]   Social Network    Social Network: Not on file  Intimate Partner Violence: Unknown (07/18/2021)   Received from [facility]   HITS    Physically Hurt: Not on file    Insult or Talk Down To: Not on file    Threaten Physical Harm: Not on file    Scream or Curse: Not on file  Depression (PHQ2-9): Not on file  Alcohol Screen: Not on file  Housing: Not on file  Utilities: Not on file  Health Literacy: Not on file     PHYSICAL EXAM  GENERAL EXAM/CONSTITUTIONAL: Vitals:  Vitals:   04/17/24 1341  BP: 132/80  Pulse: 63  SpO2: 98%  Weight: 132 lb 3.2 oz (60 kg)  Height: 5' 2 (1.575 m)    Body mass index is 24.18 kg/m. No results found. Patient is in no distress; well developed, nourished and groomed; neck is supple MASKED FACIES  CARDIOVASCULAR: Examination of carotid arteries is normal; no carotid bruits Regular rate and rhythm, no murmurs Examination of peripheral vascular system by observation and palpation is normal  EYES: Ophthalmoscopic exam of optic discs and posterior segments is normal; no papilledema or hemorrhages  MUSCULOSKELETAL: Gait, strength, tone, movements noted in Neurologic exam below  NEUROLOGIC: MENTAL STATUS:     05/12/2021    4:15 PM  MMSE - Mini Mental State Exam  Orientation to time 5  Orientation to Place 5  Registration 3  Attention/ Calculation 2  Recall 3  Language- name 2 objects 2  Language- repeat 1  Language-  follow 3 step command 3  Language- read & follow direction 1  Write a sentence 1  Copy design 1  Total score 27   awake, alert, oriented to person, place and time recent and remote memory intact normal attention and concentration language fluent, comprehension intact, naming intact fund of knowledge appropriate  CRANIAL NERVE:  2nd - no papilledema on fundoscopic exam 2nd, 3rd, 4th, 6th - pupils equal and reactive to light, visual fields full to confrontation, extraocular muscles intact, no nystagmus 5th - facial sensation symmetric 7th - facial strength symmetric 8th - hearing intact 9th - palate elevates symmetrically, uvula midline 11th - shoulder shrug symmetric 12th - tongue protrusion midline  MOTOR:  normal bulk and tone, full strength in the BUE, BLE TRIGGER FINGER IN RIGHT HAND DIGIT 5  SENSORY:  normal and symmetric to light touch  COORDINATION:  finger-nose-finger, fine finger movements normal  REFLEXES:  deep tendon reflexes TRACE and symmetric  GAIT/STATION:  narrow based gait    DIAGNOSTIC DATA (LABS, IMAGING, TESTING) - I reviewed  patient records, labs, notes, testing and imaging myself where available.  Lab Results  Component Value Date   WBC 3.2 (L) 12/30/2023   HGB 11.6 (L) 12/30/2023   HCT 36.0 12/30/2023   MCV 92.5 12/30/2023   PLT 187 12/30/2023      Component Value Date/Time   NA 144 12/30/2023 1149   K 4.3 12/30/2023 1149   CL 109 12/30/2023 1149   CO2 24 12/30/2023 1149   GLUCOSE 105 (H) 12/30/2023 1149   BUN 19 12/30/2023 1149   CREATININE 0.91 12/30/2023 1149   CALCIUM  10.1 12/30/2023 1149   PROT 7.0 12/30/2023 1149   ALBUMIN 4.7 12/30/2023 1149   AST 23 12/30/2023 1149   ALT 13 12/30/2023 1149   ALKPHOS 59 12/30/2023 1149   BILITOT 0.4 12/30/2023 1149   GFRNONAA >60 12/30/2023 1149   GFRAA >60 07/21/2019 0932   No results found for: CHOL, HDL, LDLCALC, LDLDIRECT, TRIG, CHOLHDL No results found for:  HGBA1C No results found for: VITAMINB12 No results found for: TSH  06/03/14 CT head [I reviewed images myself and agree with interpretation. -VRP]  - normal   05/24/13 MRI brain [I reviewed images myself and agree with interpretation. -VRP]  1.  No acute intracranial abnormality. 2. Volume loss and signal changes in the left mesial temporal lobe compatible with mesial temporal sclerosis. 3. Otherwise negative MRI appearance of the brain.  02/18/24 VEEG - This is a abnormal routine EEG due to mild intermittent generalized slowing. This is consistent with a mild diffuse brain dysfunction, non specific etiology. There were no seizures or epileptiform discharges seen.    ASSESSMENT AND PLAN  74 y.o. year old female here with history of seizure disorder since age [age] years old, diagnosed at [facility] in June 2015 with frontal lobe epilepsy through VEEG. Last seizure on June 2015 (during [facility] video EEG). Also with atypical event on 12/31/15 (syncope vs seizure). Also with some intermixed non-epileptic spells. Also with stress / anxiety issues. New spells in June 2019, not clearly epileptic seizures.    Dx:  No diagnosis found.    PLAN:   SEIZURE DISORDER (gutteral sounds, slow response, frontal lobe localization; last seizure 2015) + non-epileptic spells (oral dyskinetic activity, last events 2017 and 2019; Oct 2025 event was likely a stress reaction) - continue vimpat  200mg  twice a day  - continue topiramate  100mg  twice a day  LEFT FOOT NUMBNESS (since ~ 2005; some tendonitis, metatarsalgia, tarsal tunnel syndrome diagnosed and managed per podiatry in past; now resolved) - continue conservative mgmt; no further neurologic testing recommended; consider PT / sports med eval for left knee issues  RESTLESS LEG SYNDROME - continue ropinirole  (1mg  in AM, 2mg  in PM) for RLS (per PCP)  ANXIETY - follow up with psychiatry / psychology for stress/anxiety issues - no more  diazepam  refills from here (only per psychiatry)  Meds ordered this encounter  Medications   topiramate  (TOPAMAX ) 100 MG tablet    Sig: Take 1 tablet (100 mg total) by mouth 2 (two) times daily.    Dispense:  180 tablet    Refill:  4   lacosamide  (VIMPAT ) 200 MG TABS tablet    Sig: Take 1 tablet (200 mg total) by mouth 2 (two) times daily.    Dispense:  60 tablet    Refill:  5   Return in about 1 year (around 04/17/2025) for with NP.      EDUARD FABIENE HANLON, MD 04/17/2024, 3:00 PM Certified in Neurology, Neurophysiology  and Neuroimaging  [facility] [address] [place], [place] [address] [phone] "

## 2024-08-21 ENCOUNTER — Telehealth: Admitting: Diagnostic Neuroimaging

## 2025-04-17 ENCOUNTER — Ambulatory Visit: Admitting: Adult Health
